# Patient Record
Sex: Female | Born: 1940 | Race: Black or African American | Hispanic: No | Marital: Married | Smoking: Former smoker
Health system: Southern US, Community
[De-identification: ages and names within clinical notes are randomized; demographics above are authoritative.]

## PROBLEM LIST (undated history)

## (undated) DIAGNOSIS — I10 Essential (primary) hypertension: Secondary | ICD-10-CM

## (undated) DIAGNOSIS — Z853 Personal history of malignant neoplasm of breast: Secondary | ICD-10-CM

## (undated) DIAGNOSIS — IMO0001 Reserved for inherently not codable concepts without codable children: Secondary | ICD-10-CM

## (undated) DIAGNOSIS — R7303 Prediabetes: Secondary | ICD-10-CM

## (undated) DIAGNOSIS — K219 Gastro-esophageal reflux disease without esophagitis: Secondary | ICD-10-CM

## (undated) DIAGNOSIS — D649 Anemia, unspecified: Secondary | ICD-10-CM

## (undated) DIAGNOSIS — M1990 Unspecified osteoarthritis, unspecified site: Secondary | ICD-10-CM

## (undated) DIAGNOSIS — E119 Type 2 diabetes mellitus without complications: Secondary | ICD-10-CM

## (undated) DIAGNOSIS — C801 Malignant (primary) neoplasm, unspecified: Secondary | ICD-10-CM

## (undated) DIAGNOSIS — Z803 Family history of malignant neoplasm of breast: Secondary | ICD-10-CM

## (undated) DIAGNOSIS — C50919 Malignant neoplasm of unspecified site of unspecified female breast: Secondary | ICD-10-CM

## (undated) DIAGNOSIS — Z8042 Family history of malignant neoplasm of prostate: Secondary | ICD-10-CM

## (undated) HISTORY — DX: Family history of malignant neoplasm of breast: Z80.3

## (undated) HISTORY — DX: Essential (primary) hypertension: I10

## (undated) HISTORY — PX: BREAST EXCISIONAL BIOPSY: SUR124

## (undated) HISTORY — PX: APPENDECTOMY: SHX54

## (undated) HISTORY — DX: Family history of malignant neoplasm of prostate: Z80.42

## (undated) HISTORY — PX: ABDOMINAL HYSTERECTOMY: SHX81

## (undated) HISTORY — PX: CATARACT EXTRACTION, BILATERAL: SHX1313

## (undated) HISTORY — PX: BREAST BIOPSY: SHX20

## (undated) HISTORY — DX: Type 2 diabetes mellitus without complications: E11.9

## (undated) HISTORY — DX: Malignant neoplasm of unspecified site of unspecified female breast: C50.919

## (undated) HISTORY — DX: Unspecified osteoarthritis, unspecified site: M19.90

## (undated) HISTORY — DX: Personal history of malignant neoplasm of breast: Z85.3

## (undated) HISTORY — DX: Malignant (primary) neoplasm, unspecified: C80.1

---

## 2004-09-14 ENCOUNTER — Ambulatory Visit: Payer: Self-pay | Admitting: Family Medicine

## 2005-07-12 DIAGNOSIS — C50919 Malignant neoplasm of unspecified site of unspecified female breast: Secondary | ICD-10-CM

## 2005-07-12 DIAGNOSIS — C801 Malignant (primary) neoplasm, unspecified: Secondary | ICD-10-CM

## 2005-07-12 HISTORY — DX: Malignant neoplasm of unspecified site of unspecified female breast: C50.919

## 2005-07-12 HISTORY — DX: Malignant (primary) neoplasm, unspecified: C80.1

## 2005-07-12 HISTORY — PX: BREAST LUMPECTOMY: SHX2

## 2005-11-09 ENCOUNTER — Ambulatory Visit: Payer: Self-pay | Admitting: Family Medicine

## 2005-11-10 ENCOUNTER — Ambulatory Visit: Payer: Self-pay | Admitting: Ophthalmology

## 2005-11-12 ENCOUNTER — Ambulatory Visit: Payer: Self-pay | Admitting: Family Medicine

## 2005-12-08 ENCOUNTER — Other Ambulatory Visit: Payer: Self-pay

## 2005-12-15 ENCOUNTER — Ambulatory Visit: Payer: Self-pay | Admitting: General Surgery

## 2005-12-24 ENCOUNTER — Ambulatory Visit: Payer: Self-pay | Admitting: Oncology

## 2006-01-10 ENCOUNTER — Inpatient Hospital Stay: Payer: Self-pay | Admitting: Oncology

## 2006-01-18 ENCOUNTER — Ambulatory Visit: Payer: Self-pay | Admitting: Oncology

## 2006-02-09 ENCOUNTER — Ambulatory Visit: Payer: Self-pay | Admitting: Oncology

## 2006-03-12 ENCOUNTER — Ambulatory Visit: Payer: Self-pay | Admitting: Oncology

## 2006-04-11 ENCOUNTER — Ambulatory Visit: Payer: Self-pay | Admitting: Oncology

## 2006-05-12 ENCOUNTER — Ambulatory Visit: Payer: Self-pay | Admitting: Oncology

## 2006-06-11 ENCOUNTER — Ambulatory Visit: Payer: Self-pay | Admitting: Oncology

## 2006-07-12 ENCOUNTER — Ambulatory Visit: Payer: Self-pay | Admitting: Oncology

## 2006-08-10 ENCOUNTER — Ambulatory Visit: Payer: Self-pay | Admitting: General Surgery

## 2006-08-12 ENCOUNTER — Ambulatory Visit: Payer: Self-pay | Admitting: Oncology

## 2006-09-19 ENCOUNTER — Ambulatory Visit: Payer: Self-pay | Admitting: Oncology

## 2006-10-11 ENCOUNTER — Ambulatory Visit: Payer: Self-pay | Admitting: Oncology

## 2006-12-11 ENCOUNTER — Ambulatory Visit: Payer: Self-pay | Admitting: Oncology

## 2006-12-19 ENCOUNTER — Ambulatory Visit: Payer: Self-pay | Admitting: Oncology

## 2007-01-10 ENCOUNTER — Ambulatory Visit: Payer: Self-pay | Admitting: Oncology

## 2007-02-10 ENCOUNTER — Ambulatory Visit: Payer: Self-pay | Admitting: Oncology

## 2007-04-12 ENCOUNTER — Ambulatory Visit: Payer: Self-pay | Admitting: Oncology

## 2007-04-24 ENCOUNTER — Ambulatory Visit: Payer: Self-pay | Admitting: Oncology

## 2007-05-13 ENCOUNTER — Ambulatory Visit: Payer: Self-pay | Admitting: Oncology

## 2007-07-25 ENCOUNTER — Ambulatory Visit: Payer: Self-pay | Admitting: Gastroenterology

## 2007-08-13 ENCOUNTER — Ambulatory Visit: Payer: Self-pay | Admitting: Radiation Oncology

## 2007-08-24 ENCOUNTER — Ambulatory Visit: Payer: Self-pay | Admitting: Radiation Oncology

## 2007-09-10 ENCOUNTER — Ambulatory Visit: Payer: Self-pay | Admitting: Radiation Oncology

## 2008-01-17 ENCOUNTER — Ambulatory Visit: Payer: Self-pay | Admitting: General Surgery

## 2008-02-10 ENCOUNTER — Ambulatory Visit: Payer: Self-pay | Admitting: Oncology

## 2008-05-02 ENCOUNTER — Ambulatory Visit: Payer: Self-pay | Admitting: Oncology

## 2008-05-12 ENCOUNTER — Ambulatory Visit: Payer: Self-pay | Admitting: Oncology

## 2008-06-11 ENCOUNTER — Ambulatory Visit: Payer: Self-pay | Admitting: Oncology

## 2009-01-20 ENCOUNTER — Ambulatory Visit: Payer: Self-pay | Admitting: General Surgery

## 2009-07-12 HISTORY — PX: JOINT REPLACEMENT: SHX530

## 2009-09-19 ENCOUNTER — Ambulatory Visit: Payer: Self-pay | Admitting: Orthopedic Surgery

## 2009-09-30 ENCOUNTER — Inpatient Hospital Stay: Payer: Self-pay | Admitting: Orthopedic Surgery

## 2010-01-21 ENCOUNTER — Ambulatory Visit: Payer: Self-pay | Admitting: General Surgery

## 2010-10-11 DEATH — deceased

## 2011-02-10 ENCOUNTER — Ambulatory Visit: Payer: Self-pay | Admitting: General Surgery

## 2011-04-07 ENCOUNTER — Ambulatory Visit: Payer: Self-pay | Admitting: Ophthalmology

## 2011-09-10 ENCOUNTER — Ambulatory Visit: Payer: Self-pay | Admitting: Physician Assistant

## 2011-12-11 HISTORY — PX: JOINT REPLACEMENT: SHX530

## 2011-12-14 ENCOUNTER — Ambulatory Visit: Payer: Self-pay | Admitting: Orthopedic Surgery

## 2011-12-14 LAB — BASIC METABOLIC PANEL
Anion Gap: 8 (ref 7–16)
Calcium, Total: 9.3 mg/dL (ref 8.5–10.1)
Chloride: 101 mmol/L (ref 98–107)
Co2: 32 mmol/L (ref 21–32)
Glucose: 98 mg/dL (ref 65–99)
Osmolality: 282 (ref 275–301)
Potassium: 3.4 mmol/L — ABNORMAL LOW (ref 3.5–5.1)
Sodium: 141 mmol/L (ref 136–145)

## 2011-12-14 LAB — SEDIMENTATION RATE: Erythrocyte Sed Rate: 15 mm/hr (ref 0–30)

## 2011-12-14 LAB — CBC
HGB: 12.9 g/dL (ref 12.0–16.0)
MCH: 28.1 pg (ref 26.0–34.0)
MCV: 85 fL (ref 80–100)
RBC: 4.6 10*6/uL (ref 3.80–5.20)
RDW: 14.8 % — ABNORMAL HIGH (ref 11.5–14.5)
WBC: 5.1 10*3/uL (ref 3.6–11.0)

## 2011-12-14 LAB — PROTIME-INR: Prothrombin Time: 12.6 secs (ref 11.5–14.7)

## 2011-12-14 LAB — MRSA PCR SCREENING

## 2011-12-28 ENCOUNTER — Inpatient Hospital Stay: Payer: Self-pay | Admitting: Orthopedic Surgery

## 2011-12-29 LAB — BASIC METABOLIC PANEL
Calcium, Total: 8.2 mg/dL — ABNORMAL LOW (ref 8.5–10.1)
Chloride: 102 mmol/L (ref 98–107)
Co2: 28 mmol/L (ref 21–32)
Creatinine: 0.57 mg/dL — ABNORMAL LOW (ref 0.60–1.30)
EGFR (African American): >60
Glucose: 132 mg/dL — ABNORMAL HIGH (ref 65–99)
Osmolality: 278 (ref 275–301)
Potassium: 2.9 mmol/L — ABNORMAL LOW (ref 3.5–5.1)
Sodium: 139 mmol/L (ref 136–145)

## 2011-12-29 LAB — PLATELET COUNT: Platelet: 163 10*3/uL (ref 150–440)

## 2011-12-30 LAB — PATHOLOGY REPORT

## 2011-12-31 LAB — BASIC METABOLIC PANEL
Glucose: 112 mg/dL — ABNORMAL HIGH (ref 65–99)
Osmolality: 279 (ref 275–301)
Potassium: 3.8 mmol/L (ref 3.5–5.1)
Sodium: 140 mmol/L (ref 136–145)

## 2012-02-14 ENCOUNTER — Ambulatory Visit: Payer: Self-pay | Admitting: General Surgery

## 2012-03-12 HISTORY — PX: KNEE SURGERY: SHX244

## 2012-03-15 ENCOUNTER — Ambulatory Visit: Payer: Self-pay | Admitting: Orthopedic Surgery

## 2012-03-15 LAB — BASIC METABOLIC PANEL
Anion Gap: 6 — ABNORMAL LOW (ref 7–16)
BUN: 10 mg/dL (ref 7–18)
Chloride: 102 mmol/L (ref 98–107)
Creatinine: 0.75 mg/dL (ref 0.60–1.30)
EGFR (African American): >60
Glucose: 113 mg/dL — ABNORMAL HIGH (ref 65–99)
Osmolality: 281 (ref 275–301)
Potassium: 2.9 mmol/L — ABNORMAL LOW (ref 3.5–5.1)

## 2012-03-21 ENCOUNTER — Ambulatory Visit: Payer: Self-pay | Admitting: Orthopedic Surgery

## 2012-03-21 LAB — POTASSIUM: Potassium: 3.2 mmol/L — ABNORMAL LOW (ref 3.5–5.1)

## 2013-01-03 ENCOUNTER — Ambulatory Visit: Payer: Self-pay | Admitting: Gastroenterology

## 2013-01-04 ENCOUNTER — Encounter: Payer: Self-pay | Admitting: General Surgery

## 2013-02-21 ENCOUNTER — Ambulatory Visit: Payer: Self-pay | Admitting: General Surgery

## 2013-02-21 ENCOUNTER — Encounter: Payer: Self-pay | Admitting: General Surgery

## 2013-02-22 ENCOUNTER — Ambulatory Visit: Payer: Self-pay | Admitting: General Surgery

## 2013-03-06 ENCOUNTER — Encounter: Payer: Self-pay | Admitting: General Surgery

## 2013-03-06 ENCOUNTER — Ambulatory Visit (INDEPENDENT_AMBULATORY_CARE_PROVIDER_SITE_OTHER): Payer: Medicare Other | Admitting: General Surgery

## 2013-03-06 VITALS — BP 132/78 | Ht 62.0 in | Wt 190.0 lb

## 2013-03-06 DIAGNOSIS — Z853 Personal history of malignant neoplasm of breast: Secondary | ICD-10-CM

## 2013-03-06 NOTE — Progress Notes (Signed)
Patient ID: Julie [name], female   DOB: [birth year], 72 y.o.   MRN: [id number]  Chief Complaint  Patient presents with  . Follow-up    mammogram    HPI Julie [name] is a 72 y.o. female  who presents for a breast evaluation. The most recent mammogram was done on 02/21/13 at [facility] .  Patient does perform regular self breast checks and gets regular mammograms done. The patient has a history of breast cancer of the right breast treated with a Lumpectomy w/SN, Radiation, and chemo in 2007. Patient reports no new breast symptoms.     HPI  Past Medical History  Diagnosis Date  . Hypertension   . Arthritis   . Personal history of malignant neoplasm of breast   . Cancer 2007    Right breast    Past Surgical History  Procedure Laterality Date  . Appendectomy    . Abdominal hysterectomy    . Breast biopsy Right 2007  . Breast lumpectomy Right 2007  . Knee surgery Right 03/2012  . Joint replacement Left 2011    knee  . Joint replacement Right 12/2011    knee    Family History  Problem Relation Age of Onset  . Breast cancer Mother     Social History History  Substance Use Topics  . Smoking status: Former Smoker    Types: Cigarettes  . Smokeless tobacco: Never Used  . Alcohol Use: No    No Known Allergies  Current Outpatient Prescriptions  Medication Sig Dispense Refill  . amLODipine (NORVASC) 10 MG tablet       . KLOR-CON M20 20 MEQ tablet       . meloxicam (MOBIC) 15 MG tablet       . metoprolol tartrate (LOPRESSOR) 25 MG tablet       . simvastatin (ZOCOR) 20 MG tablet       . triamterene-hydrochlorothiazide (MAXZIDE-25) 37.5-25 MG per tablet        No current facility-administered medications for this visit.    Review of Systems Review of Systems  Constitutional: Negative.   Respiratory: Negative.   Cardiovascular: Negative.     Blood pressure 132/78, height 5\' 2"  (1.575 m), weight 190 lb (86.183 kg).  Physical Exam Physical Exam  Constitutional: She is  oriented to person, place, and time. She appears well-developed and well-nourished.  Eyes: Conjunctivae are normal. No scleral icterus.  Neck: Neck supple. No mass and no thyromegaly present.  Cardiovascular: Normal rate, regular rhythm and normal heart sounds.   Pulses:      Dorsalis pedis pulses are 2+ on the right side, and 2+ on the left side.       Posterior tibial pulses are 2+ on the right side, and 2+ on the left side.  No edema  Pulmonary/Chest: Effort normal and breath sounds normal. Right breast exhibits no inverted nipple, no mass, no nipple discharge, no skin change and no tenderness. Left breast exhibits no inverted nipple, no mass, no nipple discharge, no skin change and no tenderness.  Abdominal: Soft. There is no hepatosplenomegaly. There is no tenderness. No hernia.  Lymphadenopathy:    She has no cervical adenopathy.    She has no axillary adenopathy.  Neurological: She is alert and oriented to person, place, and time.  Skin: Skin is warm and dry.    Data Reviewed Mammogram stable  Assessment    Stable exam. 7 years post treatment for right breast cancer    Plan  1 yr f/u with bilateral diag mammo        Julie [name] G 03/06/2013, 8:27 PM

## 2013-09-29 ENCOUNTER — Ambulatory Visit: Payer: Self-pay | Admitting: Otolaryngology

## 2014-02-21 ENCOUNTER — Encounter: Payer: Self-pay | Admitting: General Surgery

## 2014-03-07 ENCOUNTER — Encounter: Payer: Self-pay | Admitting: General Surgery

## 2014-03-07 ENCOUNTER — Ambulatory Visit: Payer: Self-pay | Admitting: Gastroenterology

## 2014-03-08 LAB — PATHOLOGY REPORT

## 2014-03-13 ENCOUNTER — Ambulatory Visit: Payer: Medicare Other | Admitting: General Surgery

## 2014-03-21 ENCOUNTER — Ambulatory Visit (INDEPENDENT_AMBULATORY_CARE_PROVIDER_SITE_OTHER): Payer: Medicare Other | Admitting: General Surgery

## 2014-03-21 ENCOUNTER — Encounter: Payer: Self-pay | Admitting: General Surgery

## 2014-03-21 VITALS — BP 114/76 | HR 76 | Resp 14 | Ht 63.0 in | Wt 188.0 lb

## 2014-03-21 DIAGNOSIS — Z853 Personal history of malignant neoplasm of breast: Secondary | ICD-10-CM

## 2014-03-21 NOTE — Progress Notes (Signed)
Patient ID: Julie [name], female   DOB: 1941/03/09, 73 y.o.   MRN: [id number]  Chief Complaint  Patient presents with  . Breast Cancer Long Term Follow Up    HPI Julie [name] is a 73 y.o. female.  who presents for her follow up breast cancer, mammogram and breast evaluation. The most recent mammogram was done on 02-21-14. Patient does perform regular self breast checks and gets regular mammograms done.  No new breast issues.   HPI  Past Medical History  Diagnosis Date  . Hypertension   . Arthritis   . Personal history of malignant neoplasm of breast   . Cancer 2007    Right breast    Past Surgical History  Procedure Laterality Date  . Appendectomy    . Abdominal hysterectomy    . Breast biopsy Right 2007  . Breast lumpectomy Right 2007  . Knee surgery Right 03/2012  . Joint replacement Left 2011    knee  . Joint replacement Right 12/2011    knee    Family History  Problem Relation Age of Onset  . Breast cancer Mother     Social History History  Substance Use Topics  . Smoking status: Former Smoker    Types: Cigarettes  . Smokeless tobacco: Never Used  . Alcohol Use: No    No Known Allergies  Current Outpatient Prescriptions  Medication Sig Dispense Refill  . amLODipine (NORVASC) 10 MG tablet Take 1 tablet by mouth daily.      Marland Kitchen aspirin EC 81 MG tablet Take 1 tablet by mouth daily.      . cyanocobalamin (,VITAMIN B-12,) 1000 MCG/ML injection Inject into the muscle every 30 (thirty) days.      Marland Kitchen KLOR-CON M20 20 MEQ tablet Take 20 mEq by mouth 2 (two) times daily.       . metoprolol tartrate (LOPRESSOR) 25 MG tablet Take 25 mg by mouth 2 (two) times daily.       Marland Kitchen omeprazole (PRILOSEC) 20 MG capsule Take 1 capsule by mouth as needed.      . simvastatin (ZOCOR) 20 MG tablet Take 20 mg by mouth daily at 6 PM.       . triamterene-hydrochlorothiazide (MAXZIDE-25) 37.5-25 MG per tablet Take 1 tablet by mouth daily.        No current facility-administered  medications for this visit.    Review of Systems Review of Systems  Constitutional: Negative.   Respiratory: Negative.   Cardiovascular: Negative.     Blood pressure 114/76, pulse 76, resp. rate 14, height 5\' 3"  (1.6 m), weight 188 lb (85.276 kg).  Physical Exam Physical Exam  Constitutional: She is oriented to person, place, and time. She appears well-developed and well-nourished.  Eyes: Conjunctivae are normal. No scleral icterus.  Neck: Neck supple. No thyromegaly present.  Cardiovascular: Normal rate, regular rhythm and normal heart sounds.   No murmur heard. Pulmonary/Chest: Effort normal and breath sounds normal. Right breast exhibits no inverted nipple, no mass, no nipple discharge, no skin change and no tenderness. Left breast exhibits no inverted nipple, no mass, no nipple discharge, no skin change and no tenderness.  Lymphadenopathy:    She has no cervical adenopathy.    She has no axillary adenopathy.  Neurological: She is alert and oriented to person, place, and time.  Skin: Skin is warm and dry.    Data Reviewed   Mammogram reviewed and stable.   Assessment    Stable exam. 19yrs post right breast lumpectomy/radiation  for CA.     Plan    Patient to return in 1 year with bilateral screening mammogram.        Junie Panning G 03/21/2014, 1:52 PM

## 2014-03-21 NOTE — Patient Instructions (Signed)
Patient to return in 1 year for follow up.Continue self breast exams. Call office for any new breast issues or concerns.  

## 2014-05-13 ENCOUNTER — Encounter: Payer: Self-pay | Admitting: General Surgery

## 2014-07-12 HISTORY — PX: MASTECTOMY: SHX3

## 2014-07-26 DIAGNOSIS — I495 Sick sinus syndrome: Secondary | ICD-10-CM | POA: Insufficient documentation

## 2014-10-29 NOTE — Op Note (Signed)
PATIENT NAME:  Julie [name], Julie [name] MR#:  [id number] DATE OF BIRTH:  12-04-1940  DATE OF PROCEDURE:  03/21/2012  PREOPERATIVE DIAGNOSIS: Arthrofibrosis, right total knee.   POSTOPERATIVE DIAGNOSIS: Arthrofibrosis, right total knee.   PROCEDURE: Manipulation right total knee.   ANESTHESIA: General.   SURGEON: Laurene Footman, MD   DESCRIPTION OF PROCEDURE: After general anesthesia been obtained, the time-out procedures carried out, and the knee examined, initially range of motion was 10 to 85 degrees with the patient asleep. With gentle manipulation bringing the leg up into flexion, there was audible popping of adhesions and flexion could be obtained to 120 degrees. With extension getting out to 5 degrees where preop there was a 10 degree flexion contracture. Further extension was also obtained and with just holding the hip in a flexed position, knee flexion measured 110 degrees without any force applied to the tibia. With passive flexion, flexion could be brought back to over 120. This was deemed a successful manipulation. The patient was sent to the recovery room in stable condition. There was no blood loss, no complications, and no specimen.   ____________________________ Laurene Footman, MD mjm:drc D: 03/21/2012 20:06:00 ET T: 03/22/2012 08:38:51 ET JOB#: [id number]  cc: Laurene Footman, MD, <Dictator> Laurene Footman MD ELECTRONICALLY SIGNED 03/22/2012 11:52

## 2014-11-03 NOTE — Op Note (Signed)
PATIENT NAME:  Julie [name], SMARR MR#:  [id number] DATE OF BIRTH:  [birth year]  DATE OF PROCEDURE:  12/28/2011  PREOPERATIVE DIAGNOSIS: Severe right knee osteoarthritis.   POSTOPERATIVE DIAGNOSIS: Severe right knee osteoarthritis.    PROCEDURE: Right total knee replacement.   SURGEON: Laurene Footman, MD  ASSISTANT: Dorthula Matas, PA-C.   ANESTHESIA: Spinal with femoral nerve block preop.   DESCRIPTION OF PROCEDURE: Patient brought to the Operating Room and after adequate spinal anesthesia was obtained the right leg was prepped and draped in the usual sterile fashion with a tourniquet applied to the upper thigh. After patient identification and timeout procedures were completed, the leg was exsanguinated with an Esmarch and the tourniquet raised. With the knee in flexion a midline skin incision was made followed by a medial parapatellar arthrotomy. Inspection of the knee revealed exposed bone in all compartments with eburnation medially. The anterior cruciate ligament was excised along with the infrapatellar fat pad. After exposure proximally of the tibia, the proximal tibia cutting guide for the Medacta guide was applied and the proximal tibia cut carried out. Following this, the femoral guide was placed and the distal femoral cut carried out with the appropriate rotation based on pins. Following this the cutting block for the distal femur was applied. Anterior and posterior chamfer cuts made. The tibial trial was placed with the appropriate rotation based on one of the initial pins and proximal drill hole into the tibia impactor for the notch cut placed. The 10 mm insert was placed along with a size 3 femur trial. The notch cut for the trochlea was made along with the distal femoral drill holes. The trial components were placed and full extension was obtained. Next, the patella was cut using the cutting guide with drill holes made and #1 gave appropriate coverage of the patella with excellent  tracking with the no touch technique through a range of motion. There was good stability through range of motion after converting to the PS insert with distal drill hole made through the femoral trial. At this point with trials the knee was very stable and the trials were removed. Bony surfaces thoroughly irrigated and dried. Injection of morphine, Toradol and Sensorcaine with epinephrine was carried out in the posterior capsule and medial arthrotomy. Tibial component was cemented into place first followed by the PS insert with a setscrew. Next, the femoral component was impacted into place and the knee held in extension followed by placement of the patellar button and clamping it and placing it holding the knee in extension until cement had set. After the cement had set, the excess cement was removed with the use of a small osteotome around the periphery. The knee was again thoroughly irrigated. The knee was noted to be stable through a range of motion with 0 to 120 degrees of range of motion obtained. The tourniquet was let down and hemostasis checked with electrocautery. The arthrotomy was repaired using a heavy quill suture for the capsule followed by 2-0 quill subcutaneously and skin staples. Xeroform, 4 x 4's, ABD, Webril, Polar Care, Ace wrap applied along with a knee immobilizer and patient sent to recovery room in stable condition.   SPECIMEN: Cut ends of bone.   ESTIMATED BLOOD LOSS: 50 mL.   TOURNIQUET TIME: 84 minutes at 300 mmHg.  IMPLANTS: Medacta GMK primary total knee system, a size right PS femoral component, a right 3 fixed tibial component, a size 3 12 mm PS tibial insert with a size 1 resurfacing patella.  COMPLICATIONS: None.   CONDITION: To recovery room stable.   ____________________________ Laurene Footman, MD mjm:cms D: 12/28/2011 23:09:09 ET T: 12/29/2011 10:15:55 ET JOB#: [id number]  cc: Laurene Footman, MD, <Dictator> Laurene Footman MD ELECTRONICALLY SIGNED 12/29/2011  13:28

## 2014-11-03 NOTE — Discharge Summary (Signed)
PATIENT NAME:  Julie [name], Julie [name] MR#:  [id number] DATE OF BIRTH:  26-Feb-1941  DATE OF ADMISSION:  12/28/2011 DATE OF DISCHARGE:  12/31/2011  ADMITTING DIAGNOSIS: Status post right total knee arthroplasty for degenerative arthritis.   DISCHARGE DIAGNOSES: Status post right total knee arthroplasty for degenerative arthritis, hypoxia, hypokalemia, improved.   ATTENDING: Hessie [name], M.D., [facility].   PROCEDURES: On 06/18 the patient underwent right total knee arthroplasty by Dr. Rudene [name] and assistant Julie Matas, PA-C.   ANESTHESIA: Spinal.   ESTIMATED BLOOD LOSS: 50 mL.  TOURNIQUET TIME: 84 minutes.   SPECIMENS: Cut ends of bone.   OPERATIVE FINDINGS: Sclerotic bone in tricompartmental regions with erosion of the medial tibial condyle.   IMPLANTS: Medacta, My Knee.   DRAINS: No drains were placed.   COMPLICATIONS: No complications had occurred.   HISTORY:   Julie [name] is a pleasant 74 year old who had previous successful left total knee replacement in 2011. She states she has waited too long for her right knee to be replaced. She has severe pain and swelling. Previous films of her knee even from several years ago had revealed medial joint space collapse and tricompartmental osteophytes. The patient wanted to proceed with knee replacement as soon as possible.   ALLERGIES AND ADVERSE REACTIONS: None.   PAST MEDICAL HISTORY:  1. Hypertension.  2. Hyperlipidemia.  3. Breast cancer.  4. Arthritis.  5. Allergies.   PHYSICAL EXAMINATION: HEART: Regular rate and rhythm. LUNGS: Clear to auscultation. RIGHT KNEE: Positive swelling. Range of motion is 5 to 105 degrees. She has some varus deformity. Distally the posterior tibial pulses are 2+ and she is able to move her foot well.   HOSPITAL COURSE: She underwent the aforementioned procedure on 75/50 without complication and was transferred to the PACU and the orthopedic floor in stable condition. She had quite a bit  of pain at first and IV Tylenol had been used, which seemed to help. She was treated with aspirin, Xarelto, and TED hose as well as AV-I boots for deep vein thrombosis prophylaxis. Celebrex was added to her pain regimen. She was tolerating her diet early on. Postop day two her Foley catheter would be removed. She did have some hypokalemia and had to be supplemented potassium. Hypokalemia would improve while she was here and potassium was 3.8 on 06/21. Her serum magnesium was within normal limits.  Her right knee dressing was changed postop day two. There was no sign of infection about her surgical site. She did have some dips in her oxygen saturation and was treated with nasal cannula oxygen while here. She tolerated her diet well. She did pass stool. She worked with physical therapy on multiple occasions while here and ambulated 350 feet her last day here and did work on stairs.    CONDITION AT DISCHARGE: Stable.   DISPOSITION: Home with Home Health physical therapy.   DISCHARGE MEDICATIONS:  1. Oxycodone 5 to 10 mg every four hours as needed for pain. She may also take Tylenol with this.  2. Celebrex 200 mg twice a day.  3. Xarelto 10 mg one every day per oral.  4. She will not go back on aspirin until completing Xarelto.   DISCHARGE INSTRUCTIONS AND FOLLOWUP:  1. She is weight-bearing as tolerated on the surgical leg.  2. She will wear TED hose during the day.  3. Regular diet.  4. Resume home medicines with multivitamin. 5. She will keep her knee dressing on, keep it clean and dry, and use  the Polar Care unit.  6. She will call our office for any troubling symptoms and for a two-week follow-up appointment.    ____________________________ Jerrel Ivory. Akera [place]berger, [place] jrp:bjt D: 01/01/2012 11:28:43 ET T: 01/01/2012 13:06:04 ET JOB#: [id number]  cc: Jerrel Ivory. Charlett Nose, [place], <Dictator> [place] PA ELECTRONICALLY SIGNED 01/05/2012 11:56

## 2015-01-08 ENCOUNTER — Other Ambulatory Visit: Payer: Self-pay

## 2015-01-08 DIAGNOSIS — Z853 Personal history of malignant neoplasm of breast: Secondary | ICD-10-CM

## 2015-03-06 ENCOUNTER — Ambulatory Visit (INDEPENDENT_AMBULATORY_CARE_PROVIDER_SITE_OTHER): Payer: Medicare Other | Admitting: General Surgery

## 2015-03-06 ENCOUNTER — Encounter: Payer: Self-pay | Admitting: General Surgery

## 2015-03-06 VITALS — BP 134/80 | HR 66 | Resp 14 | Ht 62.0 in | Wt 180.0 lb

## 2015-03-06 DIAGNOSIS — R928 Other abnormal and inconclusive findings on diagnostic imaging of breast: Secondary | ICD-10-CM | POA: Diagnosis not present

## 2015-03-06 DIAGNOSIS — Z853 Personal history of malignant neoplasm of breast: Secondary | ICD-10-CM | POA: Diagnosis not present

## 2015-03-06 NOTE — Patient Instructions (Addendum)
Follow up in 2 weeks to see if swelling resolves. No aspirin the day of follow up.

## 2015-03-06 NOTE — Progress Notes (Signed)
Patient ID: Julie [name], female   DOB: 03-23-1941, 74 y.o.   MRN: [id number]  Chief Complaint  Patient presents with  . Follow-up    HPI Julie [name] is a 74 y.o. female.  who presents for a breast evaluation. The most recent mammogram and right breast ultrasound was done on 02-28-15. Patient does perform regular self breast checks and gets regular mammograms done.  She states she could feel a lump in the right breast after the mammogram, and the right breast was real tender with the mammogram. The lump has been slowly resolving last few days.   HPI  Past Medical History  Diagnosis Date  . Hypertension   . Arthritis   . Personal history of malignant neoplasm of breast   . Cancer 2007    Right breast    Past Surgical History  Procedure Laterality Date  . Appendectomy    . Abdominal hysterectomy    . Breast biopsy Right 2007  . Breast lumpectomy Right 2007  . Knee surgery Right 03/2012  . Joint replacement Left 2011    knee  . Joint replacement Right 12/2011    knee    Family History  Problem Relation Age of Onset  . Breast cancer Mother     Social History Social History  Substance Use Topics  . Smoking status: Former Smoker    Types: Cigarettes  . Smokeless tobacco: Never Used  . Alcohol Use: No    No Known Allergies  Current Outpatient Prescriptions  Medication Sig Dispense Refill  . amLODipine (NORVASC) 10 MG tablet Take 1 tablet by mouth daily.    Marland Kitchen aspirin EC 81 MG tablet Take 1 tablet by mouth daily.    Marland Kitchen KLOR-CON M20 20 MEQ tablet Take 20 mEq by mouth 2 (two) times daily.     . metoprolol tartrate (LOPRESSOR) 25 MG tablet Take 25 mg by mouth 2 (two) times daily.     Marland Kitchen omeprazole (PRILOSEC) 20 MG capsule Take 1 capsule by mouth as needed.    . simvastatin (ZOCOR) 20 MG tablet Take 20 mg by mouth daily at 6 PM.     . triamterene-hydrochlorothiazide (MAXZIDE-25) 37.5-25 MG per tablet Take 1 tablet by mouth daily.      No current facility-administered  medications for this visit.    Review of Systems Review of Systems  Constitutional: Negative.   Respiratory: Negative.   Cardiovascular: Negative.     Blood pressure 134/80, pulse 66, resp. rate 14, height 5\' 2"  (1.575 m), weight 180 lb (81.647 kg).  Physical Exam Physical Exam  Constitutional: She is oriented to person, place, and time. She appears well-developed and well-nourished.  Eyes: Conjunctivae are normal. No scleral icterus.  Neck: Neck supple.  Cardiovascular: Normal rate, regular rhythm and normal heart sounds.   Pulmonary/Chest: Effort normal and breath sounds normal. Right breast exhibits no inverted nipple, no mass, no nipple discharge, no skin change and no tenderness. Left breast exhibits no inverted nipple, no mass, no nipple discharge, no skin change and no tenderness.  Right breast fullness in the right upper outer quadrant just above the lumpectomy incision.   Abdominal: Soft. Normal appearance. There is no tenderness.  Lymphadenopathy:    She has no cervical adenopathy.    She has no axillary adenopathy.  Neurological: She is alert and oriented to person, place, and time.  Skin: Skin is warm and dry.  Psychiatric: She has a normal mood and affect.    Data Reviewed Mammogram and  ultrasound- new density adjacent to lumpectomy site.   Assessment    History of right breast cancer. Abnormal mammogram. Also appears she has a contusion in right lumpectomy site.      Plan    Recheck in 2 weeks. If the fullness has resolved will proceed with core biopsy      PCP:  Buzzy Han 03/06/2015, 11:11 AM

## 2015-03-25 ENCOUNTER — Other Ambulatory Visit: Payer: Medicare Other

## 2015-03-25 ENCOUNTER — Encounter: Payer: Self-pay | Admitting: General Surgery

## 2015-03-25 ENCOUNTER — Ambulatory Visit (INDEPENDENT_AMBULATORY_CARE_PROVIDER_SITE_OTHER): Payer: Medicare Other | Admitting: General Surgery

## 2015-03-25 VITALS — BP 138/64 | HR 82 | Resp 16 | Ht 62.0 in | Wt 181.8 lb

## 2015-03-25 DIAGNOSIS — N63 Unspecified lump in breast: Secondary | ICD-10-CM | POA: Diagnosis not present

## 2015-03-25 DIAGNOSIS — N6310 Unspecified lump in the right breast, unspecified quadrant: Secondary | ICD-10-CM

## 2015-03-25 DIAGNOSIS — Z853 Personal history of malignant neoplasm of breast: Secondary | ICD-10-CM | POA: Diagnosis not present

## 2015-03-25 NOTE — Progress Notes (Signed)
74 year old female here for a right breast follow up and ultrasound. The right breast above the lumpectomy site shows decreased fullness compared to 2 weeks ago.  [place] of this area shows some illdefined area of heterogeneity above the lumpectomy site. This may represent resolving ecchymosis from the recent mammogram. Will allow another 4 weeks for this to resolve fully and reassess  Follow up in one month with repeat office ultrasound and possible core biopsy.   PCP:  [place] Pink

## 2015-03-25 NOTE — Patient Instructions (Signed)
The patient is aware to call back for any questions or concerns.  

## 2015-03-26 ENCOUNTER — Encounter: Payer: Self-pay | Admitting: General Surgery

## 2015-04-24 ENCOUNTER — Other Ambulatory Visit: Payer: Medicare Other

## 2015-04-24 ENCOUNTER — Ambulatory Visit (INDEPENDENT_AMBULATORY_CARE_PROVIDER_SITE_OTHER): Payer: Medicare Other | Admitting: General Surgery

## 2015-04-24 ENCOUNTER — Encounter: Payer: Self-pay | Admitting: General Surgery

## 2015-04-24 VITALS — BP 140/68 | HR 74 | Resp 14 | Ht 62.0 in | Wt 181.0 lb

## 2015-04-24 DIAGNOSIS — N63 Unspecified lump in breast: Secondary | ICD-10-CM | POA: Diagnosis not present

## 2015-04-24 DIAGNOSIS — N6310 Unspecified lump in the right breast, unspecified quadrant: Secondary | ICD-10-CM

## 2015-04-24 NOTE — Patient Instructions (Signed)

## 2015-04-24 NOTE — Progress Notes (Signed)
Patient ID: Julie [name], female   DOB: October 22, 1940, 74 y.o.   MRN: [id number]  Chief Complaint  Patient presents with  . Follow-up    right breast ultrasound    HPI Julie [name] is a 74 y.o. female here today for a right breast ultrasound. She states most of the swelling is gone now.   HPI  Past Medical History  Diagnosis Date  . Hypertension   . Arthritis   . Personal history of malignant neoplasm of breast   . Cancer [facility]) 2007    Right breast    Past Surgical History  Procedure Laterality Date  . Appendectomy    . Abdominal hysterectomy    . Breast biopsy Right 2007  . Breast lumpectomy Right 2007  . Knee surgery Right 03/2012  . Joint replacement Left 2011    knee  . Joint replacement Right 12/2011    knee    Family History  Problem Relation Age of Onset  . Breast cancer Mother     Social History Social History  Substance Use Topics  . Smoking status: Former Smoker    Types: Cigarettes  . Smokeless tobacco: Never Used  . Alcohol Use: No    No Known Allergies  Current Outpatient Prescriptions  Medication Sig Dispense Refill  . amLODipine (NORVASC) 10 MG tablet Take 1 tablet by mouth daily.    Marland Kitchen aspirin EC 81 MG tablet Take 1 tablet by mouth daily.    Marland Kitchen KLOR-CON M20 20 MEQ tablet Take 20 mEq by mouth 2 (two) times daily.     . metoprolol tartrate (LOPRESSOR) 25 MG tablet Take 25 mg by mouth 2 (two) times daily.     Marland Kitchen omeprazole (PRILOSEC) 20 MG capsule Take 1 capsule by mouth as needed.    . simvastatin (ZOCOR) 20 MG tablet Take 20 mg by mouth daily at 6 PM.     . triamterene-hydrochlorothiazide (MAXZIDE-25) 37.5-25 MG per tablet Take 1 tablet by mouth daily.      No current facility-administered medications for this visit.    Review of Systems Review of Systems  Constitutional: Negative.   Respiratory: Negative.   Cardiovascular: Negative.     Blood pressure 140/68, pulse 74, resp. rate 14, height 5\' 2"  (1.575 m), weight 181 lb (82.101  kg).  Physical Exam Physical Exam  Constitutional: She is oriented to person, place, and time. She appears well-developed and well-nourished.  Neurological: She is alert and oriented to person, place, and time.  Skin: Skin is warm.  Psychiatric: Her behavior is normal.  Right breast exam shows again some firm tissue above lumpectomy site in uoq.  Data Reviewed Previous mammogram and ultrasound. [place] today showed again some irregular ill defined mass like area above the lumpectomy site Assessment    Persistent vague finding in right breast superior to the prior lumpectomy site. Core biopsy recommended.    Plan    With consent core biopsy completed today. Await path report.       PCP:  Buzzy Han 04/24/2015, 6:15 PM

## 2015-04-25 ENCOUNTER — Other Ambulatory Visit: Payer: Self-pay

## 2015-04-25 DIAGNOSIS — C50911 Malignant neoplasm of unspecified site of right female breast: Secondary | ICD-10-CM

## 2015-04-26 LAB — CBC WITH DIFFERENTIAL/PLATELET
BASOS: 0 %
Basophils Absolute: 0 10*3/uL (ref 0.0–0.2)
EOS (ABSOLUTE): 0.2 10*3/uL (ref 0.0–0.4)
EOS: 4 %
HEMATOCRIT: 39.6 % (ref 34.0–46.6)
Hemoglobin: 13.2 g/dL (ref 11.1–15.9)
IMMATURE GRANS (ABS): 0 10*3/uL (ref 0.0–0.1)
IMMATURE GRANULOCYTES: 0 %
LYMPHS: 27 %
Lymphocytes Absolute: 1.2 10*3/uL (ref 0.7–3.1)
MCH: 27.2 pg (ref 26.6–33.0)
MCHC: 33.3 g/dL (ref 31.5–35.7)
MCV: 82 fL (ref 79–97)
MONOCYTES: 11 %
Monocytes Absolute: 0.5 10*3/uL (ref 0.1–0.9)
NEUTROS PCT: 58 %
Neutrophils Absolute: 2.6 10*3/uL (ref 1.4–7.0)
PLATELETS: 212 10*3/uL (ref 150–379)
RBC: 4.85 x10E6/uL (ref 3.77–5.28)
RDW: 15.6 % — ABNORMAL HIGH (ref 12.3–15.4)
WBC: 4.5 10*3/uL (ref 3.4–10.8)

## 2015-04-26 LAB — COMPREHENSIVE METABOLIC PANEL
ALT: 13 IU/L (ref 0–32)
AST: 16 IU/L (ref 0–40)
Albumin/Globulin Ratio: 2 (ref 1.1–2.5)
Albumin: 4.7 g/dL (ref 3.5–4.8)
Alkaline Phosphatase: 108 IU/L (ref 39–117)
BUN/Creatinine Ratio: 19 (ref 11–26)
BUN: 15 mg/dL (ref 8–27)
Bilirubin Total: 0.3 mg/dL (ref 0.0–1.2)
CALCIUM: 9.8 mg/dL (ref 8.7–10.3)
CO2: 25 mmol/L (ref 18–29)
CREATININE: 0.8 mg/dL (ref 0.57–1.00)
Chloride: 96 mmol/L — ABNORMAL LOW (ref 97–108)
GFR calc Af Amer: 84 mL/min/{1.73_m2} (ref >59)
GFR, EST NON AFRICAN AMERICAN: 73 mL/min/{1.73_m2} (ref >59)
GLOBULIN, TOTAL: 2.4 g/dL (ref 1.5–4.5)
Glucose: 120 mg/dL — ABNORMAL HIGH (ref 65–99)
Potassium: 3.7 mmol/L (ref 3.5–5.2)
Sodium: 138 mmol/L (ref 134–144)
Total Protein: 7.1 g/dL (ref 6.0–8.5)

## 2015-04-26 LAB — CANCER ANTIGEN 27.29: CA 27.29: 13.4 U/mL (ref 0.0–38.6)

## 2015-04-28 ENCOUNTER — Ambulatory Visit (INDEPENDENT_AMBULATORY_CARE_PROVIDER_SITE_OTHER): Payer: Medicare Other | Admitting: General Surgery

## 2015-04-28 ENCOUNTER — Encounter: Payer: Self-pay | Admitting: General Surgery

## 2015-04-28 VITALS — BP 126/63 | HR 60 | Resp 12 | Ht 62.0 in | Wt 181.0 lb

## 2015-04-28 DIAGNOSIS — C50911 Malignant neoplasm of unspecified site of right female breast: Secondary | ICD-10-CM

## 2015-04-28 NOTE — Progress Notes (Signed)
Patient ID: Julie [name], female   DOB: 02-01-1941, 74 y.o.   MRN: [id number] Patient is here for breast cancer discussion.  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 Pt was advised fully. ER/PR and Her 2 are pending but her last cancer was triple negative. CA 27-29 is normal as also her basic labs.Assumimg she has no metastatic disease  A mastectomy is best option. This patient will see Dr. Oliva Bustard at the [facility] in the near future. Further follow up will be based his input.

## 2015-04-28 NOTE — Patient Instructions (Signed)
Pt will see Dr. Oliva Bustard for his input and then will discuss treatment plan.

## 2015-04-29 ENCOUNTER — Telehealth: Payer: Self-pay

## 2015-04-29 NOTE — Telephone Encounter (Signed)
Spoke with patient about appointment to see Dr Oliva Bustard. Patient is scheduled to see Dr Oliva Bustard at the [facility] cancer center on 04/30/15 at 1:00 pm. Patient is aware of date and time.

## 2015-04-30 ENCOUNTER — Ambulatory Visit: Payer: Medicare Other

## 2015-04-30 ENCOUNTER — Encounter: Payer: Self-pay | Admitting: Oncology

## 2015-04-30 ENCOUNTER — Encounter (INDEPENDENT_AMBULATORY_CARE_PROVIDER_SITE_OTHER): Payer: Self-pay

## 2015-04-30 ENCOUNTER — Inpatient Hospital Stay: Payer: Medicare Other | Attending: Oncology | Admitting: Oncology

## 2015-04-30 VITALS — BP 139/67 | HR 55 | Temp 96.6°F | Ht 62.0 in | Wt 179.9 lb

## 2015-04-30 DIAGNOSIS — Z9221 Personal history of antineoplastic chemotherapy: Secondary | ICD-10-CM | POA: Diagnosis not present

## 2015-04-30 DIAGNOSIS — I10 Essential (primary) hypertension: Secondary | ICD-10-CM | POA: Diagnosis not present

## 2015-04-30 DIAGNOSIS — C50919 Malignant neoplasm of unspecified site of unspecified female breast: Secondary | ICD-10-CM

## 2015-04-30 DIAGNOSIS — Z171 Estrogen receptor negative status [ER-]: Secondary | ICD-10-CM | POA: Diagnosis not present

## 2015-04-30 DIAGNOSIS — E538 Deficiency of other specified B group vitamins: Secondary | ICD-10-CM | POA: Insufficient documentation

## 2015-04-30 DIAGNOSIS — K635 Polyp of colon: Secondary | ICD-10-CM | POA: Insufficient documentation

## 2015-04-30 DIAGNOSIS — C50311 Malignant neoplasm of lower-inner quadrant of right female breast: Secondary | ICD-10-CM

## 2015-04-30 DIAGNOSIS — Z79899 Other long term (current) drug therapy: Secondary | ICD-10-CM

## 2015-04-30 DIAGNOSIS — Z853 Personal history of malignant neoplasm of breast: Secondary | ICD-10-CM

## 2015-04-30 DIAGNOSIS — M129 Arthropathy, unspecified: Secondary | ICD-10-CM | POA: Diagnosis not present

## 2015-04-30 DIAGNOSIS — Z803 Family history of malignant neoplasm of breast: Secondary | ICD-10-CM | POA: Diagnosis not present

## 2015-04-30 DIAGNOSIS — R197 Diarrhea, unspecified: Secondary | ICD-10-CM | POA: Insufficient documentation

## 2015-04-30 DIAGNOSIS — E785 Hyperlipidemia, unspecified: Secondary | ICD-10-CM | POA: Insufficient documentation

## 2015-04-30 DIAGNOSIS — N644 Mastodynia: Secondary | ICD-10-CM | POA: Insufficient documentation

## 2015-04-30 DIAGNOSIS — Z7982 Long term (current) use of aspirin: Secondary | ICD-10-CM | POA: Insufficient documentation

## 2015-04-30 DIAGNOSIS — D126 Benign neoplasm of colon, unspecified: Secondary | ICD-10-CM | POA: Insufficient documentation

## 2015-04-30 DIAGNOSIS — C50911 Malignant neoplasm of unspecified site of right female breast: Secondary | ICD-10-CM

## 2015-04-30 DIAGNOSIS — D649 Anemia, unspecified: Secondary | ICD-10-CM | POA: Insufficient documentation

## 2015-04-30 DIAGNOSIS — Z923 Personal history of irradiation: Secondary | ICD-10-CM | POA: Diagnosis not present

## 2015-04-30 DIAGNOSIS — Z87891 Personal history of nicotine dependence: Secondary | ICD-10-CM

## 2015-04-30 DIAGNOSIS — M1990 Unspecified osteoarthritis, unspecified site: Secondary | ICD-10-CM | POA: Insufficient documentation

## 2015-04-30 HISTORY — DX: Malignant neoplasm of unspecified site of unspecified female breast: C50.919

## 2015-04-30 LAB — CBC WITH DIFFERENTIAL/PLATELET
BASOS ABS: 0 10*3/uL (ref 0–0.1)
BASOS PCT: 1 %
Eosinophils Absolute: 0.2 10*3/uL (ref 0–0.7)
Eosinophils Relative: 3 %
HEMATOCRIT: 41.3 % (ref 35.0–47.0)
HEMOGLOBIN: 13.6 g/dL (ref 12.0–16.0)
Lymphocytes Relative: 17 %
Lymphs Abs: 1.1 10*3/uL (ref 1.0–3.6)
MCH: 26.9 pg (ref 26.0–34.0)
MCHC: 32.9 g/dL (ref 32.0–36.0)
MCV: 81.6 fL (ref 80.0–100.0)
MONO ABS: 0.5 10*3/uL (ref 0.2–0.9)
Monocytes Relative: 7 %
NEUTROS ABS: 4.7 10*3/uL (ref 1.4–6.5)
NEUTROS PCT: 72 %
Platelets: 204 10*3/uL (ref 150–440)
RBC: 5.05 MIL/uL (ref 3.80–5.20)
RDW: 15.4 % — AB (ref 11.5–14.5)
WBC: 6.5 10*3/uL (ref 3.6–11.0)

## 2015-04-30 NOTE — Progress Notes (Signed)
[place] @ [facility] Telephone:(336) [phone]  Fax:(336) [place]: 09-Feb-1941  MR#: [id number]  WER#:[id number]  Patient Care Team: Maryland Pink, MD as PCP - General (Family Medicine) Seeplaputhur Robinette Haines, MD (General Surgery)  CHIEF COMPLAINT:  Chief Complaint  Patient presents with  . Breast Cancer    pain in right breast after biopsy   Diagnosis - Chief Complaint             carcinoma of the breast, status post                               lumpectomy and sentinel lymph node biopsy.                               June 2007                               ER/PR negative                               Her-2 negative                                                              AJCC Staging pT2N1(mic)M0                               Stage grouping: IIb                                                              Completed 6 cycles Cytoxan/Taxotere                               October 2007.                               Right breast XRT.   2.  October 2 016 Patient had abnormal mammogram followed by ultrasound-guided biopsy.  It was positive for triple negative disease. Because of hematoma exact size is not known VISIT DIAGNOSIS:     ICD-9-CM ICD-10-CM   1. 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 Patient had previous lumpectomy radiation therapy and chemotherapy in 2008 Patient was getting regular mammogram done Does not have any bony pain.  Recently mammogram was abnormal.  Patient was evaluated by Dr. Jamal Collin there was significant ecchymosis present.  Which gradually resolved.  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  Appetite has been stable.  Since last evaluation patient had bilateral knee surgery done   REVIEW OF SYSTEMS:   GENERAL:  Feels good.  Active.  No fevers, sweats or weight loss.  Patient is slightly apprehensive PERFORMANCE STATUS (ECOG): 0 HEENT:  No visual changes, runny nose, sore throat, mouth sores or tenderness. Lungs: No shortness of breath or cough.  No hemoptysis. Cardiac:  No chest pain, palpitations, orthopnea, or PND. GI:  No nausea, vomiting, diarrhea, constipation, melena or hematochezia. GU:  No urgency, frequency, dysuria, or hematuria. Musculoskeletal:  : Patient had bilateral knee surgery Extremities:  No pain or swelling. Skin:  No rashes or skin changes. Neuro:  No headache, numbness or weakness, balance or coordination issues. Endocrine:  No diabetes, thyroid issues, hot flashes or night sweats. Psych:  No mood changes, depression or anxiety. Pain:  No focal pain. Review of systems:  All other systems reviewed and found to be negative.   As per HPI. Otherwise, a complete review of systems is negatve.  PAST MEDICAL HISTORY: Past Medical History  Diagnosis Date  . Hypertension   . Arthritis   . Personal history of malignant neoplasm of breast   . Cancer ([place]) 2007    Right breast  . Breast cancer ([place])     PAST SURGICAL HISTORY: Past Surgical History  Procedure Laterality Date  . Appendectomy    . Abdominal hysterectomy    . Breast biopsy Right 2007  . Breast lumpectomy Right 2007  . Knee surgery Right 03/2012  . Joint replacement Left 2011    knee  . Joint replacement Right 12/2011    knee    FAMILY HISTORY Family History  Problem Relation Age of Onset  . Breast cancer Mother     GYNECOLOGIC HISTORY:  No LMP recorded. Patient has had a hysterectomy.     ADVANCED DIRECTIVES:  Patient does  have advance healthcare directive, Patient   does not desire to make any changes  HEALTH MAINTENANCE: Social History  Substance Use Topics  . Smoking status: Former Smoker    Types: Cigarettes  . Smokeless tobacco: Never Used  . Alcohol Use: No      No Known Allergies  Current Outpatient Prescriptions  Medication Sig Dispense Refill  . amLODipine (NORVASC) 10 MG tablet Take 1 tablet by mouth daily.    Marland Kitchen aspirin EC 81 MG tablet Take 1 tablet by mouth daily.    Marland Kitchen KLOR-CON M20 20 MEQ tablet Take 20 mEq by mouth 2 (two) times daily.     . metoprolol tartrate (LOPRESSOR) 25 MG tablet Take 25 mg by mouth 2 (two) times daily.     Marland Kitchen omeprazole (PRILOSEC) 20 MG capsule Take 1 capsule by mouth as needed.    . simvastatin (ZOCOR) 20 MG tablet Take 20 mg by mouth daily at 6 PM.     . triamterene-hydrochlorothiazide (MAXZIDE-25) 37.5-25 MG per tablet Take 1 tablet by mouth daily.      No current facility-administered medications for this visit.    OBJECTIVE: PHYSICAL EXAM:  GENERAL:  Well developed, well nourished, sitting comfortably in the exam room in no acute distress. MENTAL STATUS:  Alert and oriented to person, place and time.  ENT:  Oropharynx clear without lesion.  Tongue normal. Mucous membranes moist.  RESPIRATORY:  Clear to auscultation without rales, wheezes or rhonchi. CARDIOVASCULAR:  Regular rate and rhythm without murmur, rub or gallop. BREAST: Right breast: Palpable area approximately 2 cm possibility of ecchymosis from previous mammogram and ultrasound guided biopsy.   Axillary nodes not palpable.   Left breast free of masses  Lumpectomy scar within normal limit ABDOMEN:  Soft, non-tender, with active bowel sounds, and no hepatosplenomegaly.  No masses. BACK:  No CVA tenderness.  No tenderness on percussion of the back or rib cage. SKIN:  No rashes, ulcers or lesions. EXTREMITIES: No edema, no skin discoloration or tenderness.  No palpable cords. LYMPH NODES: No  palpable cervical, supraclavicular, axillary or inguinal adenopathy  NEUROLOGICAL: Unremarkable. PSYCH:  Slightly apprehensive Filed Vitals:   04/30/15 1327  BP: 139/67  Pulse: 55  Temp: 96.6 F (35.9 C)     Body mass index is 32.89 kg/(m^2).    ECOG FS:0 - Asymptomatic  LAB RESULTS: Pathology report has been reviewed independently  All lab data has been reviewed [place] - Family History              mother had breast cancer at age 13.  Died                               at 24 - Social History              negative alcohol, negative tobacco - Additional Past Medical and hypertension   Surgical History            Hyperlipidemia                               Hysterectomy                               Hernia repair  ASSESSMENT:  Patient had a carcinoma of right breast was T2 N1 micrograms M0 tumor\ tumor was triple negative status post chemotherapy with Cytoxan and Taxotere followed by radiation therapy 2.  Recent abnormal mammogram on the right breast biopsy was consistent with invasive mammary carcinoma, apocrine features, triple negative disease Possibility of local recurrence versus second primary tumor Case will be discussed in tumor conference Complete restaging to rule out any metastases If there is no evidence of recurrent or progressive disease outside the breast and/or lymph node in the right axilla then possibility of mastectomy would be recommended and patient can be followed depending on the size of the tumor and the lymph node assessment  2.  The patient has a metastases disease then possibility of further chemotherapy can be considered I had prolonged discussion with patient and she understood.  PET scan has been ordered Case will be discussed in breast cancer conference  I discussed case with Dr. Jamal Collin , referring surgeon and that he is agreement with our plan   Patient expressed understanding and was in agreement with this plan. She also understands that She  can call clinic at any time with any questions, concerns, or complaints.    No matching staging information was found for the patient.  Forest Gleason, MD   04/30/2015 2:01 PM

## 2015-04-30 NOTE — Progress Notes (Signed)
Patient is here as follow up to breast biopsy on 04/24/15.  Pain reported in right breast, does not radiate to underarm area

## 2015-05-06 ENCOUNTER — Ambulatory Visit
Admission: RE | Admit: 2015-05-06 | Discharge: 2015-05-06 | Disposition: A | Discharge status: Home / Self Care | Payer: Medicare Other | Source: Ambulatory Visit | Attending: Oncology | Admitting: Oncology

## 2015-05-06 DIAGNOSIS — M899 Disorder of bone, unspecified: Secondary | ICD-10-CM | POA: Insufficient documentation

## 2015-05-06 DIAGNOSIS — N63 Unspecified lump in breast: Secondary | ICD-10-CM | POA: Diagnosis not present

## 2015-05-06 DIAGNOSIS — Z08 Encounter for follow-up examination after completed treatment for malignant neoplasm: Secondary | ICD-10-CM | POA: Insufficient documentation

## 2015-05-06 DIAGNOSIS — Z853 Personal history of malignant neoplasm of breast: Secondary | ICD-10-CM

## 2015-05-06 LAB — GLUCOSE, CAPILLARY: Glucose-Capillary: 110 mg/dL — ABNORMAL HIGH (ref 65–99)

## 2015-05-06 MED ORDER — FLUDEOXYGLUCOSE F - 18 (FDG) INJECTION
12.9400 | Freq: Once | INTRAVENOUS | Status: DC | PRN
Start: 2015-05-06 — End: 2015-05-12
  Administered 2015-05-06: 12.94 via INTRAVENOUS
  Filled 2015-05-06: qty 12.94

## 2015-05-08 ENCOUNTER — Encounter: Payer: Self-pay | Admitting: Oncology

## 2015-05-08 ENCOUNTER — Inpatient Hospital Stay (HOSPITAL_BASED_OUTPATIENT_CLINIC_OR_DEPARTMENT_OTHER): Payer: Medicare Other | Admitting: Oncology

## 2015-05-08 VITALS — BP 151/70 | HR 63 | Temp 96.3°F | Wt 181.7 lb

## 2015-05-08 DIAGNOSIS — Z171 Estrogen receptor negative status [ER-]: Secondary | ICD-10-CM | POA: Diagnosis not present

## 2015-05-08 DIAGNOSIS — N644 Mastodynia: Secondary | ICD-10-CM

## 2015-05-08 DIAGNOSIS — Z803 Family history of malignant neoplasm of breast: Secondary | ICD-10-CM

## 2015-05-08 DIAGNOSIS — Z7982 Long term (current) use of aspirin: Secondary | ICD-10-CM

## 2015-05-08 DIAGNOSIS — I10 Essential (primary) hypertension: Secondary | ICD-10-CM

## 2015-05-08 DIAGNOSIS — M129 Arthropathy, unspecified: Secondary | ICD-10-CM

## 2015-05-08 DIAGNOSIS — Z79899 Other long term (current) drug therapy: Secondary | ICD-10-CM | POA: Diagnosis not present

## 2015-05-08 DIAGNOSIS — C50311 Malignant neoplasm of lower-inner quadrant of right female breast: Secondary | ICD-10-CM

## 2015-05-08 DIAGNOSIS — R197 Diarrhea, unspecified: Secondary | ICD-10-CM

## 2015-05-08 DIAGNOSIS — Z923 Personal history of irradiation: Secondary | ICD-10-CM

## 2015-05-08 DIAGNOSIS — Z9221 Personal history of antineoplastic chemotherapy: Secondary | ICD-10-CM

## 2015-05-08 DIAGNOSIS — C50911 Malignant neoplasm of unspecified site of right female breast: Secondary | ICD-10-CM | POA: Diagnosis not present

## 2015-05-08 DIAGNOSIS — Z87891 Personal history of nicotine dependence: Secondary | ICD-10-CM

## 2015-05-08 NOTE — Progress Notes (Signed)
[place] @ [facility] Telephone:(336) [phone]  Fax:(336) [place]: 07/21/1940  MR#: [id number]  YTW#:[id number]  Patient Care Team: Maryland Pink, MD as PCP - General (Family Medicine) Seeplaputhur Robinette Haines, MD (General Surgery)  CHIEF COMPLAINT:  Chief Complaint  Patient presents with  . Results   Diagnosis - Chief Complaint             carcinoma of the breast, status post                               lumpectomy and sentinel lymph node biopsy.                               June 2007                               ER/PR negative                               Her-2 negative                                                              AJCC Staging pT2N1(mic)M0                               Stage grouping: IIb                                                              Completed 6 cycles Cytoxan/Taxotere                               October 2007.                               Right breast XRT.   2.  October 2 016 Patient had abnormal mammogram followed by ultrasound-guided biopsy.  It was positive for triple negative disease. Because of hematoma exact size is not known.  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 Patient had previous lumpectomy radiation therapy and chemotherapy in 2008 Patient was getting regular mammogram done Does not have any bony pain.  Recently mammogram was abnormal.  Patient was evaluated by Dr. Jamal Collin there was significant ecchymosis present.  Which gradually resolved.  Ultrasound-guided biopsy was positive for invasive mammary carcinoma triple negative disease with same apocrine features Patient was referred to me for further  evaluation and treatment consideration No significant bony  pains.  Appetite has been stable.  Since last evaluation patient had bilateral knee surgery done   REVIEW OF SYSTEMS:   GENERAL:  Feels good.  Active.  No fevers, sweats or weight loss.  Patient is slightly apprehensive PERFORMANCE STATUS (ECOG): 0 HEENT:  No visual changes, runny nose, sore throat, mouth sores or tenderness. Lungs: No shortness of breath or cough.  No hemoptysis. Cardiac:  No chest pain, palpitations, orthopnea, or PND. GI:  No nausea, vomiting, diarrhea, constipation, melena or hematochezia. GU:  No urgency, frequency, dysuria, or hematuria. Musculoskeletal:  : Patient had bilateral knee surgery Extremities:  No pain or swelling. Skin:  No rashes or skin changes. Neuro:  No headache, numbness or weakness, balance or coordination issues. Endocrine:  No diabetes, thyroid issues, hot flashes or night sweats. Psych:  No mood changes, depression or anxiety. Pain:  No focal pain. Review of systems:  All other systems reviewed and found to be negative.   As per HPI. Otherwise, a complete review of systems is negatve.  PAST MEDICAL HISTORY: Past Medical History  Diagnosis Date  . Hypertension   . Arthritis   . Personal history of malignant neoplasm of breast   . Cancer ([place]) 2007    Right breast  . Breast cancer ([place])   . Cancer of breast ([place]) 04/30/2015    PAST SURGICAL HISTORY: Past Surgical History  Procedure Laterality Date  . Appendectomy    . Abdominal hysterectomy    . Breast biopsy Right 2007  . Breast lumpectomy Right 2007  . Knee surgery Right 03/2012  . Joint replacement Left 2011    knee  . Joint replacement Right 12/2011    knee    FAMILY HISTORY Family History  Problem Relation Age of Onset  . Breast cancer Mother     GYNECOLOGIC HISTORY:  No LMP recorded. Patient has had a hysterectomy.     ADVANCED DIRECTIVES:  Patient does have advance healthcare directive, Patient   does  not desire to make any changes  HEALTH MAINTENANCE: Social History  Substance Use Topics  . Smoking status: Former Smoker    Types: Cigarettes  . Smokeless tobacco: Never Used  . Alcohol Use: No      No Known Allergies  Current Outpatient Prescriptions  Medication Sig Dispense Refill  . amLODipine (NORVASC) 10 MG tablet Take 1 tablet by mouth daily.    Marland Kitchen aspirin EC 81 MG tablet Take 1 tablet by mouth daily.    Marland Kitchen KLOR-CON M20 20 MEQ tablet Take 20 mEq by mouth 2 (two) times daily.     . metoprolol tartrate (LOPRESSOR) 25 MG tablet Take 25 mg by mouth 2 (two) times daily.     Marland Kitchen omeprazole (PRILOSEC) 20 MG capsule Take 1 capsule by mouth as needed.    . simvastatin (ZOCOR) 20 MG tablet Take 20 mg by mouth daily at 6 PM.     . triamterene-hydrochlorothiazide (MAXZIDE-25) 37.5-25 MG per tablet Take 1 tablet by mouth daily.      No current facility-administered medications for this visit.   Facility-Administered Medications Ordered in Other Visits  Medication Dose Route Frequency Provider Last Rate Last Dose  . fludeoxyglucose F - 18 (FDG) injection 12.94 milli Curie  12.94 milli Curie Intravenous Once PRN [place] Gleason, MD   12.94 milli Curie at 05/06/15 0830    OBJECTIVE: PHYSICAL EXAM:  GENERAL:  Well developed, well nourished, sitting comfortably in the exam room in no acute distress. MENTAL STATUS:  Alert and  oriented to person, place and time.  ENT:  Oropharynx clear without lesion.  Tongue normal. Mucous membranes moist.  RESPIRATORY:  Clear to auscultation without rales, wheezes or rhonchi. CARDIOVASCULAR:  Regular rate and rhythm without murmur, rub or gallop. BREAST: Right breast: Palpable area approximately 2 cm possibility of ecchymosis from previous mammogram and ultrasound guided biopsy.   Axillary nodes not palpable.   Left breast free of masses  Lumpectomy scar within normal limit ABDOMEN:  Soft, non-tender, with active bowel sounds, and no hepatosplenomegaly.  No  masses. BACK:  No CVA tenderness.  No tenderness on percussion of the back or rib cage. SKIN:  No rashes, ulcers or lesions. EXTREMITIES: No edema, no skin discoloration or tenderness.  No palpable cords. LYMPH NODES: No palpable cervical, supraclavicular, axillary or inguinal adenopathy  NEUROLOGICAL: Unremarkable. PSYCH:  Slightly apprehensive Filed Vitals:   05/08/15 1046  BP: 151/70  Pulse: 63  Temp: 96.3 F (35.7 C)     Body mass index is 33.22 kg/(m^2).    ECOG FS:0 - Asymptomatic  LAB RESULTS: Pathology report has been reviewed independently  All lab data has been reviewed [place] - Family History              mother had breast cancer at age 63.  Died                               at 60 - Social History              negative alcohol, negative tobacco - Additional Past Medical and hypertension   Surgical History            Hyperlipidemia                               Hysterectomy                               Hernia repair  ASSESSMENT:  Patient had a carcinoma of right breast was T2 N1 micrograms M0 tumor\ tumor was triple negative status post chemotherapy with Cytoxan and Taxotere followed by radiation therapy PET scan has been reviewed independently I will call Dr. Jamal Collin in next few minutes to discuss findings.  PET scan has been reviewed independently and reviewed with the patient.  Shows a right breast mass there is no evidence of distant metastases.  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 Possibility of mastectomy and then reevaluation   for further treatment.  2.patient complains of diarrhea had a colonoscopy done has been seen by gastroenterologist in the past Based on PET scan I do not have any explanation for that at  present time  Total duration of visit was 30 minutes.  50% or more time was spent in counseling patient and family regarding prognosis and options of treatment and available resources  Patient expressed understanding and was in agreement with this plan. She also understands that She can call clinic at any time with any questions, concerns, or complaints.    No matching staging information was found for the patient.  Forest Gleason, MD   05/08/2015 11:09 AM

## 2015-05-13 ENCOUNTER — Encounter: Payer: Self-pay | Admitting: General Surgery

## 2015-05-13 ENCOUNTER — Ambulatory Visit (INDEPENDENT_AMBULATORY_CARE_PROVIDER_SITE_OTHER): Payer: Medicare Other | Admitting: General Surgery

## 2015-05-13 VITALS — BP 126/64 | HR 58 | Resp 12 | Ht 62.0 in | Wt 179.0 lb

## 2015-05-13 DIAGNOSIS — C50911 Malignant neoplasm of unspecified site of right female breast: Secondary | ICD-10-CM

## 2015-05-13 NOTE — Patient Instructions (Addendum)
The patient is aware to call back for any questions or concerns.  atient is scheduled for surgery at [facility] on 05/22/15. She will pre admit at the hospital. Patient is aware of data and instructions.

## 2015-05-13 NOTE — Progress Notes (Signed)
Patient ID: Julie [name], female   DOB: 06-24-1941, 74 y.o.   MRN: [id number]  Chief Complaint  Patient presents with  . Other    discussion    HPI Julie [name] is a 74 y.o. female.  Here today to discuss breast surgery. 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 This is likely a recurrence of her previous cancer at this site. She did see Dr Oliva Bustard on 05-08-15. PET scan completed showing no evidence of axillary or distant disease.  I have reviewed the history of present illness with the patient.  HPI  Past Medical History  Diagnosis Date  . Hypertension   . Arthritis   . Personal history of malignant neoplasm of breast   . Cancer ([place]) 2007    Right breast  . Breast cancer ([place])   . Cancer of breast ([place]) 04/30/2015    Past Surgical History  Procedure Laterality Date  . Appendectomy    . Abdominal hysterectomy    . Breast biopsy Right 2007  . Breast lumpectomy Right 2007  . Knee surgery Right 03/2012  . Joint replacement Left 2011    knee  . Joint replacement Right 12/2011    knee    Family History  Problem Relation Age of Onset  . Breast cancer Mother     Social History Social History  Substance Use Topics  . Smoking status: Former Smoker    Types: Cigarettes  . Smokeless tobacco: Never Used  . Alcohol Use: No    No Known Allergies  Current Outpatient Prescriptions  Medication Sig Dispense Refill  . amLODipine (NORVASC) 10 MG tablet Take 1 tablet by mouth daily.    Marland Kitchen aspirin EC 81 MG tablet Take 1 tablet by mouth daily.    Marland Kitchen KLOR-CON M20 20 MEQ tablet Take 20 mEq by mouth 2 (two) times daily.     . metoprolol tartrate (LOPRESSOR) 25 MG tablet Take 25 mg by mouth 2 (two) times daily.     Marland Kitchen omeprazole (PRILOSEC) 20 MG capsule Take 1 capsule by mouth as needed.    . simvastatin (ZOCOR) 20 MG tablet Take 20 mg by mouth daily at 6 PM.     . triamterene-hydrochlorothiazide (MAXZIDE-25) 37.5-25 MG per tablet  Take 1 tablet by mouth daily.      No current facility-administered medications for this visit.    Review of Systems Review of Systems  Constitutional: Negative.   Respiratory: Negative.   Cardiovascular: Negative.     Blood pressure 126/64, pulse 58, resp. rate 12, height 5\' 2"  (1.575 m), weight 179 lb (81.194 kg).  Physical Exam Physical Exam  Constitutional: She is oriented to person, place, and time. She appears well-developed and well-nourished.  HENT:  Mouth/Throat: Oropharynx is clear and moist.  Eyes: Conjunctivae are normal. No scleral icterus.  Neck: Neck supple.  Cardiovascular: Normal rate, regular rhythm and normal heart sounds.   Pulmonary/Chest: Effort normal and breath sounds normal. Right breast exhibits no inverted nipple, no mass, no nipple discharge, no skin change and no tenderness. Left breast exhibits no inverted nipple, no mass, no nipple discharge, no skin change and no tenderness.  Same mass just above lumpectomy site on right breast.   Lymphadenopathy:    She has no cervical adenopathy.    She has no axillary adenopathy.  Neurological: She is alert and oriented to person, place, and time.  Skin: Skin is warm and dry.  Psychiatric: Her behavior is  normal.    Data Reviewed Progress notes and recent imaging studies reviewed.  PET pos mass right breast, no findings of mets. 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 PET scan showing no axillary or distant involvement.     Plan   Recommended mastectomy to include some axillary nodes-she has had SN biopsy in past. Procedure,risks and benefits explained. Pt is agreeable. Discussed surgery in detail. Schedule right breast mastectomy. Patient is scheduled for surgery at [facility] on 05/22/15. She will pre admit at the hospital. Patient is aware of data and instructions.      PCP:  Buzzy Han 05/13/2015, 3:27 PM

## 2015-05-16 ENCOUNTER — Encounter
Admission: RE | Admit: 2015-05-16 | Discharge: 2015-05-16 | Disposition: A | Discharge status: Home / Self Care | Payer: Medicare Other | Source: Ambulatory Visit | Attending: General Surgery | Admitting: General Surgery

## 2015-05-16 DIAGNOSIS — Z01812 Encounter for preprocedural laboratory examination: Secondary | ICD-10-CM | POA: Diagnosis present

## 2015-05-16 DIAGNOSIS — Z01818 Encounter for other preprocedural examination: Secondary | ICD-10-CM | POA: Insufficient documentation

## 2015-05-16 HISTORY — DX: Reserved for inherently not codable concepts without codable children: IMO0001

## 2015-05-16 HISTORY — DX: Gastro-esophageal reflux disease without esophagitis: K21.9

## 2015-05-16 HISTORY — DX: Anemia, unspecified: D64.9

## 2015-05-16 LAB — POTASSIUM: Potassium: 3.3 mmol/L — ABNORMAL LOW (ref 3.5–5.1)

## 2015-05-16 NOTE — Patient Instructions (Signed)
  Your procedure is scheduled on: 05/22/15 Thurs  Report to Day Surgery.2nd floor medical mall To find out your arrival time please call [phone] between 1PM - 3PM on Wed 05/21/15.  Remember: Instructions that are not followed completely may result in serious medical risk, up to and including death, or upon the discretion of your surgeon and anesthesiologist your surgery may need to be rescheduled.    _x___ 1. Do not eat food or drink liquids after midnight. No gum chewing or hard candies.     ____ 2. No Alcohol for 24 hours before or after surgery.   ____ 3. Bring all medications with you on the day of surgery if instructed.    __x__ 4. Notify your doctor if there is any change in your medical condition     (cold, fever, infections).     Do not wear jewelry, make-up, hairpins, clips or nail polish.  Do not wear lotions, powders, or perfumes. You may wear deodorant.  Do not shave 48 hours prior to surgery. Men may shave face and neck.  Do not bring valuables to the hospital.    [facility] is not responsible for any belongings or valuables.               Contacts, dentures or bridgework may not be worn into surgery.  Leave your suitcase in the car. After surgery it may be brought to your room.  For patients admitted to the hospital, discharge time is determined by your                treatment team.   Patients discharged the day of surgery will not be allowed to drive home.   Please read over the following fact sheets that you were given:      _x___ Take these medicines the morning of surgery with A SIP OF WATER:    1. amLODipine (NORVASC) 10 MG tablet  2. metoprolol tartrate (LOPRESSOR) 25 MG tablet  3. omeprazole (PRILOSEC) 20 MG capsule  4.  5.  6.  ____ Fleet Enema (as directed)   _x___ Use CHG Soap as directed  ____ Use inhalers on the day of surgery  ____ Stop metformin 2 days prior to surgery    ____ Take 1/2 of usual insulin dose the night before surgery  and none on the morning of surgery.   _x___ Stop Coumadin/Plavix/aspirin on stop aspirin 1 week before surgery  ____ Stop Anti-inflammatories on May take Tylenol as needed   ____ Stop supplements until after surgery.    ____ Bring C-Pap to the hospital.

## 2015-05-16 NOTE — Pre-Procedure Instructions (Signed)
Dr Angie Fava office faxed with potassium results and request for an increase in potassium supplement.

## 2015-05-19 ENCOUNTER — Telehealth: Payer: Self-pay | Admitting: *Deleted

## 2015-05-19 NOTE — Telephone Encounter (Signed)
Patient notified as instructed and verbalizes understanding.   We will proceed with surgery as scheduled for Thursday, 05-22-15.

## 2015-05-19 NOTE — Telephone Encounter (Signed)
-----   Message from Christene Lye, MD sent at 05/19/2015  8:25 AM EST ----- Increase K lor 52meq to TID from current bid till surgery.

## 2015-05-22 ENCOUNTER — Encounter: Payer: Self-pay | Admitting: *Deleted

## 2015-05-22 ENCOUNTER — Encounter
Admission: RE | Disposition: A | Discharge status: Home / Self Care | Payer: Self-pay | Source: Ambulatory Visit | Attending: General Surgery

## 2015-05-22 ENCOUNTER — Ambulatory Visit
Admission: RE | Admit: 2015-05-22 | Discharge: 2015-05-22 | Disposition: A | Discharge status: Home / Self Care | Payer: Medicare Other | Source: Ambulatory Visit | Attending: General Surgery | Admitting: General Surgery

## 2015-05-22 ENCOUNTER — Ambulatory Visit: Payer: Medicare Other | Admitting: Anesthesiology

## 2015-05-22 DIAGNOSIS — Z90710 Acquired absence of both cervix and uterus: Secondary | ICD-10-CM | POA: Insufficient documentation

## 2015-05-22 DIAGNOSIS — R0602 Shortness of breath: Secondary | ICD-10-CM | POA: Insufficient documentation

## 2015-05-22 DIAGNOSIS — Z853 Personal history of malignant neoplasm of breast: Secondary | ICD-10-CM | POA: Insufficient documentation

## 2015-05-22 DIAGNOSIS — C50911 Malignant neoplasm of unspecified site of right female breast: Secondary | ICD-10-CM | POA: Insufficient documentation

## 2015-05-22 DIAGNOSIS — Z79899 Other long term (current) drug therapy: Secondary | ICD-10-CM | POA: Diagnosis not present

## 2015-05-22 DIAGNOSIS — D649 Anemia, unspecified: Secondary | ICD-10-CM | POA: Diagnosis not present

## 2015-05-22 DIAGNOSIS — I10 Essential (primary) hypertension: Secondary | ICD-10-CM | POA: Insufficient documentation

## 2015-05-22 DIAGNOSIS — J45909 Unspecified asthma, uncomplicated: Secondary | ICD-10-CM | POA: Insufficient documentation

## 2015-05-22 DIAGNOSIS — C50919 Malignant neoplasm of unspecified site of unspecified female breast: Secondary | ICD-10-CM | POA: Diagnosis present

## 2015-05-22 DIAGNOSIS — Z96653 Presence of artificial knee joint, bilateral: Secondary | ICD-10-CM | POA: Insufficient documentation

## 2015-05-22 DIAGNOSIS — K219 Gastro-esophageal reflux disease without esophagitis: Secondary | ICD-10-CM | POA: Insufficient documentation

## 2015-05-22 DIAGNOSIS — Z7982 Long term (current) use of aspirin: Secondary | ICD-10-CM | POA: Diagnosis not present

## 2015-05-22 DIAGNOSIS — Z87891 Personal history of nicotine dependence: Secondary | ICD-10-CM | POA: Insufficient documentation

## 2015-05-22 DIAGNOSIS — M1990 Unspecified osteoarthritis, unspecified site: Secondary | ICD-10-CM | POA: Insufficient documentation

## 2015-05-22 DIAGNOSIS — C50411 Malignant neoplasm of upper-outer quadrant of right female breast: Secondary | ICD-10-CM | POA: Diagnosis not present

## 2015-05-22 HISTORY — PX: MASTECTOMY MODIFIED RADICAL: SHX5962

## 2015-05-22 LAB — POCT I-STAT 4, (NA,K, GLUC, HGB,HCT)
Glucose, Bld: 118 mg/dL — ABNORMAL HIGH (ref 65–99)
HEMATOCRIT: 38 % (ref 36.0–46.0)
Hemoglobin: 12.9 g/dL (ref 12.0–15.0)
Potassium: 3.3 mmol/L — ABNORMAL LOW (ref 3.5–5.1)
SODIUM: 140 mmol/L (ref 135–145)

## 2015-05-22 SURGERY — MASTECTOMY, MODIFIED RADICAL
Anesthesia: General | Laterality: Right | Wound class: Clean Contaminated

## 2015-05-22 MED ORDER — PROPOFOL 10 MG/ML IV BOLUS
INTRAVENOUS | Status: DC | PRN
  Administered 2015-05-22: 150 mg via INTRAVENOUS

## 2015-05-22 MED ORDER — ONDANSETRON HCL 4 MG/2ML IJ SOLN
INTRAMUSCULAR | Status: DC | PRN
  Administered 2015-05-22: 4 mg via INTRAVENOUS

## 2015-05-22 MED ORDER — METHYLENE BLUE 1 % INJ SOLN
INTRAMUSCULAR | Status: AC
  Filled 2015-05-22: qty 10

## 2015-05-22 MED ORDER — FENTANYL CITRATE (PF) 100 MCG/2ML IJ SOLN
INTRAMUSCULAR | Status: DC | PRN
  Administered 2015-05-22 (×2): 25 ug via INTRAVENOUS
  Administered 2015-05-22: 50 ug via INTRAVENOUS

## 2015-05-22 MED ORDER — ACETAMINOPHEN 10 MG/ML IV SOLN
INTRAVENOUS | Status: DC | PRN
  Administered 2015-05-22: 1000 mg via INTRAVENOUS

## 2015-05-22 MED ORDER — ACETAMINOPHEN 10 MG/ML IV SOLN
INTRAVENOUS | Status: AC
  Filled 2015-05-22: qty 100

## 2015-05-22 MED ORDER — CEFAZOLIN SODIUM-DEXTROSE 2-3 GM-% IV SOLR
INTRAVENOUS | Status: AC
  Administered 2015-05-22: 2 g via INTRAVENOUS
  Filled 2015-05-22: qty 50

## 2015-05-22 MED ORDER — CHLORHEXIDINE GLUCONATE 4 % EX LIQD: 1.0000 "application " | Freq: Once | CUTANEOUS | Status: DC

## 2015-05-22 MED ORDER — LIDOCAINE HCL (CARDIAC) 20 MG/ML IV SOLN
INTRAVENOUS | Status: DC | PRN
  Administered 2015-05-22: 80 mg via INTRAVENOUS

## 2015-05-22 MED ORDER — DEXAMETHASONE SODIUM PHOSPHATE 10 MG/ML IJ SOLN
INTRAMUSCULAR | Status: DC | PRN
  Administered 2015-05-22: 8 mg via INTRAVENOUS

## 2015-05-22 MED ORDER — ONDANSETRON HCL 4 MG/2ML IJ SOLN: 4.0000 mg | Freq: Once | INTRAMUSCULAR | Status: DC | PRN

## 2015-05-22 MED ORDER — CEFAZOLIN SODIUM-DEXTROSE 2-3 GM-% IV SOLR
2.0000 g | INTRAVENOUS | Status: AC
  Administered 2015-05-22: 2 g via INTRAVENOUS

## 2015-05-22 MED ORDER — LACTATED RINGERS IV SOLN
INTRAVENOUS | Status: DC
  Administered 2015-05-22: 09:00:00 via INTRAVENOUS

## 2015-05-22 MED ORDER — FENTANYL CITRATE (PF) 100 MCG/2ML IJ SOLN
INTRAMUSCULAR | Status: AC
  Administered 2015-05-22: 25 ug via INTRAVENOUS
  Filled 2015-05-22: qty 2

## 2015-05-22 MED ORDER — METHYLENE BLUE 1 % INJ SOLN
INTRAMUSCULAR | Status: DC | PRN
  Administered 2015-05-22: 3 mL

## 2015-05-22 MED ORDER — SODIUM CHLORIDE 0.9 % IJ SOLN
INTRAMUSCULAR | Status: AC
  Filled 2015-05-22: qty 10

## 2015-05-22 MED ORDER — FENTANYL CITRATE (PF) 100 MCG/2ML IJ SOLN
25.0000 ug | INTRAMUSCULAR | Status: DC | PRN
  Administered 2015-05-22 (×2): 25 ug via INTRAVENOUS

## 2015-05-22 MED ORDER — EPHEDRINE SULFATE 50 MG/ML IJ SOLN
INTRAMUSCULAR | Status: DC | PRN
  Administered 2015-05-22: 10 mg via INTRAVENOUS

## 2015-05-22 MED ORDER — OXYCODONE-ACETAMINOPHEN 5-325 MG PO TABS: 1.0000 | ORAL_TABLET | ORAL | Status: DC | PRN

## 2015-05-22 SURGICAL SUPPLY — 47 items
APPLIER CLIP 11 MED OPEN (CLIP)
APPLIER CLIP 13 LRG OPEN (CLIP)
BLADE SURG 10 STRL SS SAFETY (BLADE) ×2 IMPLANT
BULB RESERV EVAC DRAIN JP 100C (MISCELLANEOUS) ×2 IMPLANT
CANISTER SUCT 1200ML W/VALVE (MISCELLANEOUS) ×2 IMPLANT
CHLORAPREP W/TINT 26ML (MISCELLANEOUS) ×2 IMPLANT
CLIP APPLIE 11 MED OPEN (CLIP) IMPLANT
CLIP APPLIE 13 LRG OPEN (CLIP) IMPLANT
DEVICE DISSECT PLASMABLAD 3.0S (MISCELLANEOUS) ×1 IMPLANT
DRAIN CHANNEL JP 15F RND 16 (MISCELLANEOUS) ×2 IMPLANT
DRAPE LAPAROTOMY TRNSV 106X77 (MISCELLANEOUS) ×2 IMPLANT
DRSG TELFA 3X8 NADH (GAUZE/BANDAGES/DRESSINGS) ×2 IMPLANT
ELECT CAUTERY BLADE 6.4 (BLADE) ×2 IMPLANT
GAUZE FLUFF 18X24 1PLY STRL (GAUZE/BANDAGES/DRESSINGS) ×2 IMPLANT
GAUZE SPONGE 4X4 12PLY STRL (GAUZE/BANDAGES/DRESSINGS) IMPLANT
GLOVE BIO SURGEON STRL SZ7 (GLOVE) ×12 IMPLANT
GOWN STRL REUS W/ TWL LRG LVL3 (GOWN DISPOSABLE) ×3 IMPLANT
GOWN STRL REUS W/TWL LRG LVL3 (GOWN DISPOSABLE) ×3
HARMONIC SCALPEL FOCUS (MISCELLANEOUS) IMPLANT
KIT RM TURNOVER STRD PROC AR (KITS) ×2 IMPLANT
LABEL OR SOLS (LABEL) IMPLANT
LIQUID BAND (GAUZE/BANDAGES/DRESSINGS) ×2 IMPLANT
NS IRRIG 1000ML POUR BTL (IV SOLUTION) ×2 IMPLANT
PACK BASIN MINOR ARMC (MISCELLANEOUS) ×2 IMPLANT
PAD ABD DERMACEA PRESS 5X9 (GAUZE/BANDAGES/DRESSINGS) IMPLANT
PAD GROUND ADULT SPLIT (MISCELLANEOUS) ×2 IMPLANT
PIN SAFETY STRL (MISCELLANEOUS) ×2 IMPLANT
PLASMABLADE 3.0S (MISCELLANEOUS) ×2
SPONGE LAP 18X18 5 PK (GAUZE/BANDAGES/DRESSINGS) ×2 IMPLANT
STRIP CLOSURE SKIN 1/2X4 (GAUZE/BANDAGES/DRESSINGS) IMPLANT
SURGI-BRA LG (MISCELLANEOUS) IMPLANT
SURGI-BRA M (MISCELLANEOUS) ×2 IMPLANT
SUT ETHILON 2 0 FSLX (SUTURE) IMPLANT
SUT ETHILON 3-0 FS-10 30 BLK (SUTURE) ×2
SUT MNCRL AB 3-0 PS2 27 (SUTURE) ×4 IMPLANT
SUT SILK 2 0 (SUTURE)
SUT SILK 2-0 18XBRD TIE 12 (SUTURE) IMPLANT
SUT SILK 3 0 (SUTURE)
SUT SILK 3-0 18XBRD TIE 12 (SUTURE) IMPLANT
SUT SILK 4 0 (SUTURE)
SUT SILK 4-0 18XBRD TIE 12 (SUTURE) IMPLANT
SUT VIC AB 2-0 CT1 27 (SUTURE) ×2
SUT VIC AB 2-0 CT1 TAPERPNT 27 (SUTURE) ×2 IMPLANT
SUT VICRYL+ 3-0 144IN (SUTURE) ×2 IMPLANT
SUTURE EHLN 3-0 FS-10 30 BLK (SUTURE) ×1 IMPLANT
SWABSTK COMLB BENZOIN TINCTURE (MISCELLANEOUS) IMPLANT
TUBING CONNECTING 10 (TUBING) ×2 IMPLANT

## 2015-05-22 NOTE — Anesthesia Procedure Notes (Signed)
Procedure Name: LMA Insertion Date/Time: 05/22/2015 8:48 AM Performed by: Delaney Meigs Pre-anesthesia Checklist: Patient identified, Emergency Drugs available, Suction available, Patient being monitored and Timeout performed Patient Re-evaluated:Patient Re-evaluated prior to inductionOxygen Delivery Method: Circle system utilized and Simple face mask Preoxygenation: Pre-oxygenation with 100% oxygen Intubation Type: IV induction Ventilation: Mask ventilation without difficulty LMA: LMA inserted LMA Size: 3.5 Number of attempts: 1 Placement Confirmation: breath sounds checked- equal and bilateral and positive ETCO2 Tube secured with: Tape

## 2015-05-22 NOTE — Interval H&P Note (Signed)
History and Physical Interval Note:  05/22/2015 7:48 AM  Julie [name]  has presented today for surgery, with the diagnosis of CANCER RIGHT BREAST  The various methods of treatment have been discussed with the patient and family. After consideration of risks, benefits and other options for treatment, the patient has consented to  Procedure(s): MASTECTOMY MODIFIED RADICAL (Right) as a surgical intervention .  The patient's history has been reviewed, patient examined, no change in status, stable for surgery.  I have reviewed the patient's chart and labs.  Questions were answered to the patient's satisfaction.     Brylie Sneath G

## 2015-05-22 NOTE — H&P (View-Only) (Signed)
Patient ID: Julie [name], female   DOB: 04/16/1941, 73 y.o.   MRN: [id number]  Chief Complaint  Patient presents with  . Other    discussion    HPI Julie [name] is a 73 y.o. female.  Here today to discuss breast surgery. 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 This is likely a recurrence of her previous cancer at this site. She did see Dr Choksi on 05-08-15. PET scan completed showing no evidence of axillary or distant disease.  I have reviewed the history of present illness with the patient.  HPI  Past Medical History  Diagnosis Date  . Hypertension   . Arthritis   . Personal history of malignant neoplasm of breast   . Cancer (HCC) 2007    Right breast  . Breast cancer (HCC)   . Cancer of breast (HCC) 04/30/2015    Past Surgical History  Procedure Laterality Date  . Appendectomy    . Abdominal hysterectomy    . Breast biopsy Right 2007  . Breast lumpectomy Right 2007  . Knee surgery Right 03/2012  . Joint replacement Left 2011    knee  . Joint replacement Right 12/2011    knee    Family History  Problem Relation Age of Onset  . Breast cancer Mother     Social History Social History  Substance Use Topics  . Smoking status: Former Smoker    Types: Cigarettes  . Smokeless tobacco: Never Used  . Alcohol Use: No    No Known Allergies  Current Outpatient Prescriptions  Medication Sig Dispense Refill  . amLODipine (NORVASC) 10 MG tablet Take 1 tablet by mouth daily.    . aspirin EC 81 MG tablet Take 1 tablet by mouth daily.    . KLOR-CON M20 20 MEQ tablet Take 20 mEq by mouth 2 (two) times daily.     . metoprolol tartrate (LOPRESSOR) 25 MG tablet Take 25 mg by mouth 2 (two) times daily.     . omeprazole (PRILOSEC) 20 MG capsule Take 1 capsule by mouth as needed.    . simvastatin (ZOCOR) 20 MG tablet Take 20 mg by mouth daily at 6 PM.     . triamterene-hydrochlorothiazide (MAXZIDE-25) 37.5-25 MG per tablet  Take 1 tablet by mouth daily.      No current facility-administered medications for this visit.    Review of Systems Review of Systems  Constitutional: Negative.   Respiratory: Negative.   Cardiovascular: Negative.     Blood pressure 126/64, pulse 58, resp. rate 12, height 5' 2" (1.575 m), weight 179 lb (81.194 kg).  Physical Exam Physical Exam  Constitutional: She is oriented to person, place, and time. She appears well-developed and well-nourished.  HENT:  Mouth/Throat: Oropharynx is clear and moist.  Eyes: Conjunctivae are normal. No scleral icterus.  Neck: Neck supple.  Cardiovascular: Normal rate, regular rhythm and normal heart sounds.   Pulmonary/Chest: Effort normal and breath sounds normal. Right breast exhibits no inverted nipple, no mass, no nipple discharge, no skin change and no tenderness. Left breast exhibits no inverted nipple, no mass, no nipple discharge, no skin change and no tenderness.  Same mass just above lumpectomy site on right breast.   Lymphadenopathy:    She has no cervical adenopathy.    She has no axillary adenopathy.  Neurological: She is alert and oriented to person, place, and time.  Skin: Skin is warm and dry.  Psychiatric: Her behavior is   normal.    Data Reviewed Progress notes and recent imaging studies reviewed.  PET pos mass right breast, no findings of mets. 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 PET scan showing no axillary or distant involvement.     Plan   Recommended mastectomy to include some axillary nodes-she has had SN biopsy in past. Procedure,risks and benefits explained. Pt is agreeable. Discussed surgery in detail. Schedule right breast mastectomy. Patient is scheduled for surgery at [facility] on 05/22/15. She will pre admit at the hospital. Patient is aware of data and instructions.      PCP:  Hedrick, James  Alisea Matte G 05/13/2015, 3:27 PM    

## 2015-05-22 NOTE — Transfer of Care (Signed)
Immediate Anesthesia Transfer of Care Note  Patient: Julie [name]  Procedure(s) Performed: Procedure(s): MASTECTOMY MODIFIED RADICAL (Right)  Patient Location: PACU  Anesthesia Type:General  Level of Consciousness: sedated  Airway & Oxygen Therapy: Patient Spontanous Breathing and Patient connected to face mask oxygen  Post-op Assessment: Report given to RN and Post -op Vital signs reviewed and stable  Post vital signs: Reviewed and stable  Last Vitals: 10:36 63 hr 97% sat 134/61 21 resp 96.8 temp Filed Vitals:   05/22/15 0734  BP: 144/56  Pulse: 60  Temp: 36.6 C  Resp: 14    Complications: No apparent anesthesia complications

## 2015-05-22 NOTE — Anesthesia Preprocedure Evaluation (Signed)
Anesthesia Evaluation  Patient identified by MRN, date of birth, ID band Patient awake    Reviewed: Allergy & Precautions, H&P , NPO status , Patient's Chart, lab work & pertinent test results, reviewed documented beta blocker date and time   History of Anesthesia Complications Negative for: history of anesthetic complications  Airway Mallampati: III  TM Distance: >3 FB Neck ROM: full    Dental no notable dental hx. (+) Partial Upper, Missing, Partial Lower   Pulmonary shortness of breath and with exertion, asthma , neg sleep apnea, neg COPD, neg recent URI, former smoker,    Pulmonary exam normal breath sounds clear to auscultation       Cardiovascular Exercise Tolerance: Good hypertension, On Medications and On Home Beta Blockers (-) angina(-) CAD, (-) Past MI, (-) Cardiac Stents and (-) CABG Normal cardiovascular exam(-) dysrhythmias (-) Valvular Problems/Murmurs Rhythm:regular Rate:Normal     Neuro/Psych negative neurological ROS  negative psych ROS   GI/Hepatic Neg liver ROS, GERD  Medicated and Controlled,  Endo/Other  negative endocrine ROS  Renal/GU negative Renal ROS  negative genitourinary   Musculoskeletal   Abdominal   Peds  Hematology  (+) Blood dyscrasia, anemia ,   Anesthesia Other Findings Past Medical History:   Hypertension                                                 Arthritis                                                    Personal history of malignant neoplasm of brea*              Cancer ([place])                                    2007           Comment:Right breast   Breast cancer ([place])                                          Cancer of breast ([place])                          04/30/2015   Anemia                                                       Shortness of breath dyspnea                                  GERD (gastroesophageal reflux disease)                       Reproductive/Obstetrics negative OB ROS  Anesthesia Physical Anesthesia Plan  ASA: III  Anesthesia Plan: General   Post-op Pain Management:    Induction:   Airway Management Planned:   Additional Equipment:   Intra-op Plan:   Post-operative Plan:   Informed Consent: I have reviewed the patients History and Physical, chart, labs and discussed the procedure including the risks, benefits and alternatives for the proposed anesthesia with the patient or authorized representative who has indicated his/her understanding and acceptance.   Dental Advisory Given  Plan Discussed with: Anesthesiologist, CRNA and Surgeon  Anesthesia Plan Comments:         Anesthesia Quick Evaluation

## 2015-05-22 NOTE — Op Note (Signed)
Preop diagnosis: Recurrent carcinoma right breast  Post op diagnosis: Same  Operation: Right total mastectomy and axillary node biopsy    Surgeon: S.G.sankar  Assistant:     Anesthesia: Gen.  Complications: None  EBL: Less than 50 mL  Drains: JP drasin  Description: Patient was put to sleep with an LMA. The right breast and axilla were prepped and draped as sterile field after instillation of 6 mL of 50% diluted methylene blue in the central right breast. Patient has had previous sentinel node biopsy at the time of her initial cancer and therefore nuclear contrast was not utilized. Timeout was performed. Skin incision was mapped out and marked on the skin including the adequate margin above the recurrent cancer in the upper-outer quadrant. The incision also included the prior axillary incision for sentinel node. Skin incision was made with a plasma blade and the skin and subcutaneous tissue was elevated along the upper portion first to expose the lower border of the pectoralis muscle. From here on the axilla was entered and a single fat replaced node was identified with no other palpable or visible nodes. This node was excised out and sent separately to pathology. The inferior flap was then created the extent superiorly was to the infraclavicular space and and inferiorly to the upper rectus fascia. Bleeding was controlled with with the plasma blade and also ligatures of 3-0 Vicryl. The breast along the pectoralis fascia was then dissected off from the underlying pectoralis major muscle from medial to lateral aspect and removed. The lateral end was tagged for orientation and sent to pathology. Wound was irrigated and after ensuring hemostasis the wound was closed. A Jackson-Pratt drain was placed and brought out through a stab incision inferiorly in a fasting the skin with a nylon stitch. The subcutaneous tissue and the deep dermal layer were approximated with interrupted 3-0 Vicryl stitches. The  skin was closed with 2 running sutures of from subcuticular 3-0 Monocryl. Wound was covered with liqui  Ban. Telfa fluffs and ABDs and surgical bra used for dressing. Patient subsequently was extubated and returned recovery room stable condition.

## 2015-05-23 NOTE — Anesthesia Postprocedure Evaluation (Signed)
  Anesthesia Post-op Note  Patient: Julie [name]  Procedure(s) Performed: Procedure(s): MASTECTOMY MODIFIED RADICAL (Right)  Anesthesia type:General  Patient location: PACU  Post pain: Pain level controlled  Post assessment: Post-op Vital signs reviewed, Patient's Cardiovascular Status Stable, Respiratory Function Stable, Patent Airway and No signs of Nausea or vomiting  Post vital signs: Reviewed and stable  Last Vitals:  Filed Vitals:   05/22/15 1218  BP: 119/60  Pulse:   Temp:   Resp:     Level of consciousness: awake, alert  and patient cooperative  Complications: No apparent anesthesia complications

## 2015-05-26 LAB — SURGICAL PATHOLOGY

## 2015-05-27 ENCOUNTER — Encounter: Payer: Self-pay | Admitting: General Surgery

## 2015-05-27 ENCOUNTER — Ambulatory Visit (INDEPENDENT_AMBULATORY_CARE_PROVIDER_SITE_OTHER): Payer: Medicare Other | Admitting: General Surgery

## 2015-05-27 VITALS — BP 152/70 | HR 62 | Resp 12 | Ht 62.0 in | Wt 177.0 lb

## 2015-05-27 DIAGNOSIS — C50911 Malignant neoplasm of unspecified site of right female breast: Secondary | ICD-10-CM

## 2015-05-27 NOTE — Progress Notes (Signed)
Here today for her postoperative visit, right mastectomy on 05-22-15. States she is doing well. I have reviewed the history of present illness with the patient.  Serosanguinous fluid present in drain. Right breast incision healing well, no signs of infection.   Follow up in one week. Continue tracking fluid amounts in drain. If less than 30 mL in a day, she has been instructed to call the office to discuss drain removal.  Has appointment for Nov. 29th with Dr. Oliva Bustard to discuss further treatment options - chemotherapy vs anti-hormonal therapy.

## 2015-05-27 NOTE — Patient Instructions (Addendum)
The patient is aware to call back for any questions or concerns. Call if drainage is less than 30 ml a day total so we can remove the drain.

## 2015-05-28 ENCOUNTER — Encounter: Payer: Self-pay | Admitting: General Surgery

## 2015-05-29 ENCOUNTER — Ambulatory Visit (INDEPENDENT_AMBULATORY_CARE_PROVIDER_SITE_OTHER): Payer: Medicare Other | Admitting: *Deleted

## 2015-05-29 DIAGNOSIS — C50911 Malignant neoplasm of unspecified site of right female breast: Secondary | ICD-10-CM

## 2015-06-03 ENCOUNTER — Encounter: Payer: Self-pay | Admitting: General Surgery

## 2015-06-03 ENCOUNTER — Ambulatory Visit (INDEPENDENT_AMBULATORY_CARE_PROVIDER_SITE_OTHER): Payer: Medicare Other | Admitting: General Surgery

## 2015-06-03 VITALS — BP 142/72 | HR 74 | Resp 12 | Ht 62.0 in | Wt 167.0 lb

## 2015-06-03 DIAGNOSIS — C50911 Malignant neoplasm of unspecified site of right female breast: Secondary | ICD-10-CM

## 2015-06-03 NOTE — Patient Instructions (Signed)
Patient to return in one week. 

## 2015-06-03 NOTE — Progress Notes (Signed)
She requested her dressing to be changed around the JP drain. Redressed. The drain is clean, with no signs of infection noted. Follow up as scheduled.

## 2015-06-03 NOTE — Patient Instructions (Signed)
Continue drain record sheet 

## 2015-06-03 NOTE — Progress Notes (Signed)
Here today for her postoperative visit, right mastectomy on 05-22-15. States she is doing well. Drain sheet present. I have reviewed the history of present illness with the patient. Right mastectomy incision is clean and intact. Drainage is serous- 33ml yesterday, 49ml each day prior. New bulb placed for the drain. Patient to return in one week.

## 2015-06-10 ENCOUNTER — Inpatient Hospital Stay: Payer: Medicare Other | Attending: Oncology | Admitting: Oncology

## 2015-06-10 VITALS — BP 133/74 | HR 62 | Temp 97.7°F | Wt 179.2 lb

## 2015-06-10 DIAGNOSIS — M129 Arthropathy, unspecified: Secondary | ICD-10-CM | POA: Insufficient documentation

## 2015-06-10 DIAGNOSIS — Z9011 Acquired absence of right breast and nipple: Secondary | ICD-10-CM | POA: Diagnosis not present

## 2015-06-10 DIAGNOSIS — Z79899 Other long term (current) drug therapy: Secondary | ICD-10-CM | POA: Diagnosis not present

## 2015-06-10 DIAGNOSIS — Z87891 Personal history of nicotine dependence: Secondary | ICD-10-CM

## 2015-06-10 DIAGNOSIS — Z853 Personal history of malignant neoplasm of breast: Secondary | ICD-10-CM | POA: Diagnosis not present

## 2015-06-10 DIAGNOSIS — K219 Gastro-esophageal reflux disease without esophagitis: Secondary | ICD-10-CM | POA: Diagnosis not present

## 2015-06-10 DIAGNOSIS — C50911 Malignant neoplasm of unspecified site of right female breast: Secondary | ICD-10-CM | POA: Diagnosis not present

## 2015-06-10 DIAGNOSIS — Z171 Estrogen receptor negative status [ER-]: Secondary | ICD-10-CM | POA: Insufficient documentation

## 2015-06-10 DIAGNOSIS — Z7982 Long term (current) use of aspirin: Secondary | ICD-10-CM | POA: Insufficient documentation

## 2015-06-10 DIAGNOSIS — I10 Essential (primary) hypertension: Secondary | ICD-10-CM | POA: Diagnosis not present

## 2015-06-10 DIAGNOSIS — Z803 Family history of malignant neoplasm of breast: Secondary | ICD-10-CM | POA: Insufficient documentation

## 2015-06-10 DIAGNOSIS — C50311 Malignant neoplasm of lower-inner quadrant of right female breast: Secondary | ICD-10-CM

## 2015-06-10 DIAGNOSIS — D649 Anemia, unspecified: Secondary | ICD-10-CM

## 2015-06-10 DIAGNOSIS — R0609 Other forms of dyspnea: Secondary | ICD-10-CM | POA: Diagnosis not present

## 2015-06-10 NOTE — Progress Notes (Signed)
[place] @ [facility] [phone]  Fax:(336) [place]: 1941/03/11  MR#: [id number]  RCB#:[id number]  Patient Care Team: [place] Pink, MD as PCP - General (Family Medicine) Seeplaputhur Robinette Haines, MD (General Surgery)  CHIEF COMPLAINT:  Chief Complaint  Patient presents with  . Breast Cancer   Diagnosis - Chief Complaint             carcinoma of the breast, status post                               lumpectomy and sentinel lymph node biopsy.                               June 2007                               ER/PR negative                               Her-2 negative                                                              AJCC Staging pT2N1(mic)M0                               Stage grouping: IIb                                                              Completed 6 cycles Cytoxan/Taxotere                               October 2007.                               Right breast XRT.     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  All the margins were clear.  4 lymph nodes were negative for any tumor.  Invasive mammary carcinoma with apocrine features.  Margins are negative. 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  Patient came today further follow-up.  Patient had mastectomy done.  Previous PET scan was negative.  Patient still has a drainage tube.   REVIEW OF SYSTEMS:   GENERAL:  Feels good.  Active.  No fevers, sweats  or weight loss.  Patient is slightly  apprehensive PERFORMANCE STATUS (ECOG): 0 HEENT:  No visual changes, runny nose, sore throat, mouth sores or tenderness. Lungs: No shortness of breath or cough.  No hemoptysis. Cardiac:  No chest pain, palpitations, orthopnea, or PND. GI:  No nausea, vomiting, diarrhea, constipation, melena or hematochezia. GU:  No urgency, frequency, dysuria, or hematuria. Musculoskeletal:  : Patient had bilateral knee surgery Extremities:  No pain or swelling. Skin:  No rashes or skin changes. Neuro:  No headache, numbness or weakness, balance or coordination issues. Endocrine:  No diabetes, thyroid issues, hot flashes or night sweats. Psych:  No mood changes, depression or anxiety. Pain:  No focal pain. Review of systems:  All other systems reviewed and found to be negative.   As per HPI. Otherwise, a complete review of systems is negatve.  PAST MEDICAL HISTORY: Past Medical History  Diagnosis Date  . Hypertension   . Arthritis   . Personal history of malignant neoplasm of breast   . Cancer ([place]) 2007    Right breast  . Breast cancer ([place])   . Cancer of breast ([place]) 04/30/2015  . Anemia   . Shortness of breath dyspnea   . GERD (gastroesophageal reflux disease)     PAST SURGICAL HISTORY: Past Surgical History  Procedure Laterality Date  . Appendectomy    . Abdominal hysterectomy    . Breast biopsy Right 2007  . Breast lumpectomy Right 2007  . Knee surgery Right 03/2012  . Joint replacement Left 2011    knee  . Joint replacement Right 12/2011    knee  . Cataract extraction, bilateral Bilateral   . Mastectomy modified radical Right 05/22/2015    Procedure: MASTECTOMY MODIFIED RADICAL;  Surgeon: Christene Lye, MD;  Location: [facility] ORS;  Service: General;  Laterality: Right;    FAMILY HISTORY Family History  Problem Relation Age of Onset  . Breast cancer Mother        ADVANCED DIRECTIVES:  Patient does have advance healthcare directive, Patient   does not desire to make  any changes  HEALTH MAINTENANCE: Social History  Substance Use Topics  . Smoking status: Former Smoker    Types: Cigarettes    Quit date: 05/15/1980  . Smokeless tobacco: Never Used  . Alcohol Use: No      No Known Allergies  Current Outpatient Prescriptions  Medication Sig Dispense Refill  . amLODipine (NORVASC) 10 MG tablet Take 1 tablet by mouth daily.    Marland Kitchen aspirin EC 81 MG tablet Take 1 tablet by mouth daily.    . cyanocobalamin (,VITAMIN B-12,) 1000 MCG/ML injection Inject 1,000 mcg into the muscle every 30 (thirty) days.    Marland Kitchen KLOR-CON M20 20 MEQ tablet Take 20 mEq by mouth 2 (two) times daily.     . metoprolol tartrate (LOPRESSOR) 25 MG tablet Take 25 mg by mouth 2 (two) times daily.     Marland Kitchen omeprazole (PRILOSEC) 20 MG capsule Take 1 capsule by mouth as needed.    Marland Kitchen oxyCODONE-acetaminophen (ROXICET) 5-325 MG tablet Take 1 tablet by mouth every 4 (four) hours as needed. 30 tablet 0  . simvastatin (ZOCOR) 20 MG tablet Take 20 mg by mouth daily at 6 PM.     . triamterene-hydrochlorothiazide (MAXZIDE-25) 37.5-25 MG per tablet Take 1 tablet by mouth daily.      No current facility-administered medications for this visit.    OBJECTIVE: PHYSICAL EXAM:  GENERAL:  Well developed, well nourished, sitting comfortably in the  exam room in no acute distress. MENTAL STATUS:  Alert and oriented to person, place and time.  ENT:  Oropharynx clear without lesion.  Tongue normal. Mucous membranes moist.  RESPIRATORY:  Clear to auscultation without rales, wheezes or rhonchi. CARDIOVASCULAR:  Regular rate and rhythm without murmur, rub or gallop. BREAST: Right breast: Palpable area approximately 2 cm possibility of ecchymosis from previous mammogram and ultrasound guided biopsy.   Axillary nodes not palpable.   Left breast free of masses  Lumpectomy scar within normal limit ABDOMEN:  Soft, non-tender, with active bowel sounds, and no hepatosplenomegaly.  No masses. BACK:  No CVA tenderness.   No tenderness on percussion of the back or rib cage. SKIN:  No rashes, ulcers or lesions. EXTREMITIES: No edema, no skin discoloration or tenderness.  No palpable cords. LYMPH NODES: No palpable cervical, supraclavicular, axillary or inguinal adenopathy  NEUROLOGICAL: Unremarkable. PSYCH:  Slightly apprehensive Filed Vitals:   06/10/15 1500  BP: 133/74  Pulse: 62  Temp: 97.7 F (36.5 C)     Body mass index is 32.77 kg/(m^2).    ECOG FS:0 - Asymptomatic  LAB RESULTS: Pathology report has been reviewed independently  All lab data has been reviewed [place] - Family History              mother had breast cancer at age 61.  Died                               at 25 - Social History              negative alcohol, negative tobacco - Additional Past Medical and hypertension   Surgical History            Hyperlipidemia                               Hysterectomy                               Hernia repair  ASSESSMENT:  Patient had a carcinoma of right breast was T2 N1 micrograms M0 tumor\(2007) status post lumpectomy radiation therapy and chemotherapy. Now most likely recurrent disease triple negative.  T2 N0 M0 tumor Question is whether she would benefit from any adjuvant treatment Patient is still healing from the mastectomy site.  DATA  may suggest further chemotherapy might be helpful to delay metastases disease In a patient who had down prolonged disease-free interval. I would discussed all these issues in tumor conference as well as with patient Return appointment in few weeks  Total duration of visit was 30 minutes.  50% or more time was spent in counseling patient and family regarding prognosis and options of treatment and available resources  Patient expressed understanding and was in agreement with this plan. She also understands that She can call clinic at any time with any questions, concerns, or complaints.    No matching staging information was found for the  patient.  Forest Gleason, MD   06/10/2015 3:24 PM

## 2015-06-12 ENCOUNTER — Ambulatory Visit (INDEPENDENT_AMBULATORY_CARE_PROVIDER_SITE_OTHER): Payer: Medicare Other | Admitting: General Surgery

## 2015-06-12 ENCOUNTER — Encounter: Payer: Self-pay | Admitting: General Surgery

## 2015-06-12 VITALS — BP 138/78 | HR 68 | Resp 14 | Ht 62.0 in | Wt 179.0 lb

## 2015-06-12 DIAGNOSIS — C50911 Malignant neoplasm of unspecified site of right female breast: Secondary | ICD-10-CM

## 2015-06-12 NOTE — Patient Instructions (Addendum)
The patient is aware to call back for any questions or concerns. Follow up in 2 weeks.

## 2015-06-12 NOTE — Progress Notes (Signed)
Here today for postoperative visit, right mastectomy on 05-22-15. Drain sheet present. She states she is doing well. I have reviewed the history of present illness with the patient.  Incision is healing well, clean and intact with no evidence of infection. Drain with small amount of serous fluid present.   Drain removed and small dressing placed over area.    Follow up in 2 weeks. Prescription given for prosthesis and mastectomy bras.   PCP:  [place] Pink

## 2015-06-17 ENCOUNTER — Encounter: Payer: Self-pay | Admitting: Oncology

## 2015-06-26 ENCOUNTER — Ambulatory Visit (INDEPENDENT_AMBULATORY_CARE_PROVIDER_SITE_OTHER): Payer: Medicare Other | Admitting: General Surgery

## 2015-06-26 ENCOUNTER — Encounter: Payer: Self-pay | Admitting: General Surgery

## 2015-06-26 VITALS — BP 130/72 | HR 72 | Resp 14 | Ht 62.0 in | Wt 177.0 lb

## 2015-06-26 DIAGNOSIS — C50911 Malignant neoplasm of unspecified site of right female breast: Secondary | ICD-10-CM

## 2015-06-26 NOTE — Patient Instructions (Signed)
The patient is aware to call back for any questions or concerns.  

## 2015-06-26 NOTE — Progress Notes (Signed)
Here today for postoperative visit, right mastectomy on 05-22-15. She states she is doing well. I have reviewed the history of present illness with the patient.  Site well healed, no seroma and no infection noted. She has appt with Dr. Oliva Bustard next week. Decision to be made regarding chemo  Discussed possible port placement if chemo is decided on. The risks associated with central venous access including arterial, pulmonary and venous injury were reviewed. The possible need for additional treatment if pulmonary injury occurs (chest tube placement) was discussed.  This information has been scribed by Karie Fetch [place] Follow up 2 months. PCP:  Kary Kos

## 2015-07-01 ENCOUNTER — Inpatient Hospital Stay: Payer: Medicare Other | Attending: Oncology

## 2015-07-01 ENCOUNTER — Encounter: Payer: Self-pay | Admitting: Oncology

## 2015-07-01 ENCOUNTER — Inpatient Hospital Stay (HOSPITAL_BASED_OUTPATIENT_CLINIC_OR_DEPARTMENT_OTHER): Payer: Medicare Other | Admitting: Oncology

## 2015-07-01 VITALS — BP 129/75 | HR 59 | Temp 97.4°F | Resp 18 | Wt 179.0 lb

## 2015-07-01 DIAGNOSIS — Z79899 Other long term (current) drug therapy: Secondary | ICD-10-CM

## 2015-07-01 DIAGNOSIS — R0602 Shortness of breath: Secondary | ICD-10-CM | POA: Insufficient documentation

## 2015-07-01 DIAGNOSIS — Z171 Estrogen receptor negative status [ER-]: Secondary | ICD-10-CM

## 2015-07-01 DIAGNOSIS — Z862 Personal history of diseases of the blood and blood-forming organs and certain disorders involving the immune mechanism: Secondary | ICD-10-CM | POA: Insufficient documentation

## 2015-07-01 DIAGNOSIS — Z853 Personal history of malignant neoplasm of breast: Secondary | ICD-10-CM | POA: Diagnosis not present

## 2015-07-01 DIAGNOSIS — K219 Gastro-esophageal reflux disease without esophagitis: Secondary | ICD-10-CM | POA: Insufficient documentation

## 2015-07-01 DIAGNOSIS — Z9221 Personal history of antineoplastic chemotherapy: Secondary | ICD-10-CM | POA: Diagnosis not present

## 2015-07-01 DIAGNOSIS — Z7982 Long term (current) use of aspirin: Secondary | ICD-10-CM | POA: Diagnosis not present

## 2015-07-01 DIAGNOSIS — C50311 Malignant neoplasm of lower-inner quadrant of right female breast: Secondary | ICD-10-CM

## 2015-07-01 DIAGNOSIS — Z87891 Personal history of nicotine dependence: Secondary | ICD-10-CM | POA: Insufficient documentation

## 2015-07-01 DIAGNOSIS — M899 Disorder of bone, unspecified: Secondary | ICD-10-CM

## 2015-07-01 DIAGNOSIS — E785 Hyperlipidemia, unspecified: Secondary | ICD-10-CM

## 2015-07-01 DIAGNOSIS — C50911 Malignant neoplasm of unspecified site of right female breast: Secondary | ICD-10-CM | POA: Diagnosis present

## 2015-07-01 DIAGNOSIS — I10 Essential (primary) hypertension: Secondary | ICD-10-CM | POA: Insufficient documentation

## 2015-07-01 DIAGNOSIS — Z803 Family history of malignant neoplasm of breast: Secondary | ICD-10-CM | POA: Insufficient documentation

## 2015-07-01 DIAGNOSIS — Z9011 Acquired absence of right breast and nipple: Secondary | ICD-10-CM | POA: Diagnosis not present

## 2015-07-01 DIAGNOSIS — M129 Arthropathy, unspecified: Secondary | ICD-10-CM

## 2015-07-01 LAB — COMPREHENSIVE METABOLIC PANEL
ALK PHOS: 100 U/L (ref 38–126)
ALT: 14 U/L (ref 14–54)
ANION GAP: 7 (ref 5–15)
AST: 19 U/L (ref 15–41)
Albumin: 4.5 g/dL (ref 3.5–5.0)
BUN: 14 mg/dL (ref 6–20)
CALCIUM: 9.3 mg/dL (ref 8.9–10.3)
CO2: 28 mmol/L (ref 22–32)
Chloride: 102 mmol/L (ref 101–111)
Creatinine, Ser: 0.74 mg/dL (ref 0.44–1.00)
GFR calc non Af Amer: >60 mL/min (ref >60)
Glucose, Bld: 121 mg/dL — ABNORMAL HIGH (ref 65–99)
Potassium: 3.4 mmol/L — ABNORMAL LOW (ref 3.5–5.1)
SODIUM: 137 mmol/L (ref 135–145)
Total Bilirubin: 0.4 mg/dL (ref 0.3–1.2)
Total Protein: 7.4 g/dL (ref 6.5–8.1)

## 2015-07-01 LAB — CBC WITH DIFFERENTIAL/PLATELET
Basophils Absolute: 0 10*3/uL (ref 0–0.1)
Basophils Relative: 1 %
EOS ABS: 0.3 10*3/uL (ref 0–0.7)
EOS PCT: 7 %
HCT: 41.6 % (ref 35.0–47.0)
HEMOGLOBIN: 13.7 g/dL (ref 12.0–16.0)
LYMPHS ABS: 1.4 10*3/uL (ref 1.0–3.6)
Lymphocytes Relative: 30 %
MCH: 27.2 pg (ref 26.0–34.0)
MCHC: 33 g/dL (ref 32.0–36.0)
MCV: 82.5 fL (ref 80.0–100.0)
MONOS PCT: 11 %
Monocytes Absolute: 0.5 10*3/uL (ref 0.2–0.9)
Neutro Abs: 2.4 10*3/uL (ref 1.4–6.5)
Neutrophils Relative %: 51 %
PLATELETS: 161 10*3/uL (ref 150–440)
RBC: 5.04 MIL/uL (ref 3.80–5.20)
RDW: 15.5 % — ABNORMAL HIGH (ref 11.5–14.5)
WBC: 4.6 10*3/uL (ref 3.6–11.0)

## 2015-07-01 NOTE — Progress Notes (Signed)
Patient here for plan of care.

## 2015-07-01 NOTE — Progress Notes (Signed)
Julie [name] @ [facility] Telephone:(336) [phone]  Fax:(336) [place]: 05-20-1941  MR#: [id number]  JAS#:[id number]  Patient Care Team: Maryland Pink, MD as PCP - General (Family Medicine) Seeplaputhur Robinette Haines, MD (General Surgery)  CHIEF COMPLAINT:  Chief Complaint  Patient presents with  . Breast Cancer   Diagnosis - Chief Complaint             carcinoma of the breast, status post                               lumpectomy and sentinel lymph node biopsy.                               June 2007                               ER/PR negative                               Her-2 negative                                                              AJCC Staging pT2N1(mic)M0                               Stage grouping: IIb                                                              Completed 6 cycles Cytoxan/Taxotere                               October 2007.                               Right breast XRT.     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  All the margins were clear.  4 lymph nodes were negative for any tumor.  Invasive mammary carcinoma with apocrine features.  Margins are negative. 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   With  Apocrine features  Chest wall wound has healed completely. Patient is here to discuss further options of therapy  REVIEW OF SYSTEMS:   GENERAL:  Feels good.  Active.  No fevers, sweats or weight loss.  Patient is slightly apprehensive PERFORMANCE STATUS  (ECOG): 0 HEENT:  No visual changes, runny nose, sore throat, mouth sores or tenderness. Lungs: No shortness of breath or cough.  No hemoptysis. Cardiac:  No chest pain, palpitations, orthopnea, or PND. GI:  No nausea, vomiting, diarrhea, constipation, melena or hematochezia. GU:  No urgency, frequency, dysuria, or hematuria. Musculoskeletal:  : Patient had bilateral knee surgery Extremities:  No pain or swelling. Skin:  No rashes or skin changes. Neuro:  No headache, numbness or weakness, balance or coordination issues. Endocrine:  No diabetes, thyroid issues, hot flashes or night sweats. Psych:  No mood changes, depression or anxiety. Pain:  No focal pain. Review of systems:  All other systems reviewed and found to be negative.   As per HPI. Otherwise, a complete review of systems is negatve.  PAST MEDICAL HISTORY: Past Medical History  Diagnosis Date  . Hypertension   . Arthritis   . Personal history of malignant neoplasm of breast   . Cancer ([place]) 2007    Right breast  . Breast cancer ([place])   . Cancer of breast ([place]) 04/30/2015  . Anemia   . Shortness of breath dyspnea   . GERD (gastroesophageal reflux disease)     PAST SURGICAL HISTORY: Past Surgical History  Procedure Laterality Date  . Appendectomy    . Abdominal hysterectomy    . Breast biopsy Right 2007  . Breast lumpectomy Right 2007  . Knee surgery Right 03/2012  . Joint replacement Left 2011    knee  . Joint replacement Right 12/2011    knee  . Cataract extraction, bilateral Bilateral   . Mastectomy modified radical Right 05/22/2015    Procedure: MASTECTOMY MODIFIED RADICAL;  Surgeon: Christene Lye, MD;  Location: [facility] ORS;  Service: General;  Laterality: Right;    FAMILY HISTORY Family History  Problem Relation Age of Onset  . Breast cancer Mother        ADVANCED DIRECTIVES:  Patient does have advance healthcare directive, Patient   does not desire to make any changes  HEALTH  MAINTENANCE: Social History  Substance Use Topics  . Smoking status: Former Smoker    Types: Cigarettes    Quit date: 05/15/1980  . Smokeless tobacco: Never Used  . Alcohol Use: No      No Known Allergies  Current Outpatient Prescriptions  Medication Sig Dispense Refill  . amLODipine (NORVASC) 10 MG tablet Take 1 tablet by mouth daily.    Marland Kitchen aspirin EC 81 MG tablet Take 1 tablet by mouth daily.    . cyanocobalamin (,VITAMIN B-12,) 1000 MCG/ML injection Inject 1,000 mcg into the muscle every 30 (thirty) days.    Marland Kitchen KLOR-CON M20 20 MEQ tablet Take 20 mEq by mouth 2 (two) times daily.     . metoprolol tartrate (LOPRESSOR) 25 MG tablet Take 25 mg by mouth 2 (two) times daily.     Marland Kitchen omeprazole (PRILOSEC) 20 MG capsule Take 1 capsule by mouth as needed.    . simvastatin (ZOCOR) 20 MG tablet Take 20 mg by mouth daily at 6 PM.     . triamterene-hydrochlorothiazide (MAXZIDE-25) 37.5-25 MG per tablet Take 1 tablet by mouth daily.      No current facility-administered medications for this visit.    OBJECTIVE: PHYSICAL EXAM:  GENERAL:  Well developed, well nourished, sitting comfortably in the exam room in no acute distress. MENTAL STATUS:  Alert and oriented to person, place and time.  ENT:  Oropharynx clear without lesion.  Tongue normal. Mucous membranes moist.  RESPIRATORY:  Clear to auscultation without rales, wheezes or rhonchi. CARDIOVASCULAR:  Regular rate and rhythm without murmur, rub or gallop. BREAST: right breast: Status post mastectomy. Wound is healed completely.  There is no evidence of recurrent diseaseLeft breast free of masses  Lumpectomy scar within normal limit ABDOMEN:  Soft, non-tender, with active bowel sounds, and no hepatosplenomegaly.  No masses. BACK:  No CVA tenderness.  No tenderness on percussion of the back or rib cage. SKIN:  No rashes, ulcers or lesions. EXTREMITIES: No edema, no skin discoloration or tenderness.  No palpable cords. LYMPH NODES: No  palpable cervical, supraclavicular, axillary or inguinal adenopathy  NEUROLOGICAL: Unremarkable. PSYCH:  Slightly apprehensive Filed Vitals:   07/01/15 0924  BP: 129/75  Pulse: 59  Temp: 97.4 F (36.3 C)  Resp: 18     Body mass index is 32.73 kg/(m^2).    ECOG FS:0 - Asymptomatic  LAB RESULTS: Pathology report has been reviewed independently  All lab data has been reviewed [place] - Family History              mother had breast cancer at age 87.  Died                               at 64 - Social History              negative alcohol, negative tobacco - Additional Past Medical and hypertension   Surgical History            Hyperlipidemia                               Hysterectomy                               Hernia repair  ASSESSMENT:  Patient had a carcinoma of right breast was T2 N1 micrograms M0 tumor\(2007) status post lumpectomy radiation therapy and chemotherapy. Now most likely recurrent disease triple negative.  T2 N0 M0 tumor Question is whether she would benefit from any adjuvant treatment Patient is still healing from the mastectomy site. Since present problem was considered most likely secondary t recurrence.  As pathology had the same features has a previous pathology. She during fact that patient has apocrine features was decided that probably further chemotherapy may not be beneficial at present time We will continue to follow this patient. I discussed the situation with my colleagues at level as well as presented in multidisciplinary meeting and opinion was to observe the patient rather than do any adjuvant chemotherapy at present time. Reevaluate patient in 4 months     Patient expressed understanding and was in agreement with this plan. She also understands that She can call clinic at any time with any questions, concerns, or complaints.    No matching staging information was found for the patient.  Forest Gleason, MD   07/01/2015 9:31 AM

## 2015-07-02 LAB — CANCER ANTIGEN 27.29: CA 27.29: 6.4 U/mL (ref 0.0–38.6)

## 2015-08-05 ENCOUNTER — Encounter: Payer: Self-pay | Admitting: General Surgery

## 2015-08-14 ENCOUNTER — Encounter: Payer: Self-pay | Admitting: General Surgery

## 2015-08-27 ENCOUNTER — Encounter: Payer: Self-pay | Admitting: General Surgery

## 2015-08-27 ENCOUNTER — Ambulatory Visit (INDEPENDENT_AMBULATORY_CARE_PROVIDER_SITE_OTHER): Payer: Medicare Other | Admitting: General Surgery

## 2015-08-27 VITALS — BP 168/82 | HR 78 | Resp 14 | Ht 62.0 in | Wt 182.0 lb

## 2015-08-27 DIAGNOSIS — C50911 Malignant neoplasm of unspecified site of right female breast: Secondary | ICD-10-CM

## 2015-08-27 NOTE — Progress Notes (Signed)
Here today for 3 month postoperative visit, right mastectomy on 05-22-15. She states she is doing well. She does feel like her right arm gets stiff some times. I have reviewed the history of present illness with the patient.  Site well healed, no seroma and no infection noted. No lymphadenopathy in neck or axillae  Impression, CA right breast, triple negative. Initial lumpectomy/chemo/rad. Subsequent recurrence s/p mastectomy   Follow up in 3 months.  PCP:  [place] Pink This information has been scribed by Karie Fetch [place].

## 2015-08-27 NOTE — Patient Instructions (Signed)
The patient is aware to call back for any questions or concerns.  

## 2015-10-30 ENCOUNTER — Ambulatory Visit: Payer: Medicare Other | Admitting: Oncology

## 2015-10-30 ENCOUNTER — Other Ambulatory Visit: Payer: Medicare Other

## 2015-11-24 ENCOUNTER — Encounter: Payer: Self-pay | Admitting: General Surgery

## 2015-11-24 ENCOUNTER — Ambulatory Visit (INDEPENDENT_AMBULATORY_CARE_PROVIDER_SITE_OTHER): Payer: Medicare Other | Admitting: General Surgery

## 2015-11-24 VITALS — BP 124/70 | HR 65 | Resp 13 | Ht 62.0 in | Wt 176.0 lb

## 2015-11-24 DIAGNOSIS — C50911 Malignant neoplasm of unspecified site of right female breast: Secondary | ICD-10-CM | POA: Diagnosis not present

## 2015-11-24 NOTE — Patient Instructions (Addendum)
Patient to return in six months left breast screening mammogram. Continue self breast exams. Call office for any new breast issues or concerns.

## 2015-11-24 NOTE — Progress Notes (Signed)
Patient ID: Julie [name], female   DOB: [birth year], 75 y.o.   MRN: [id number]  Chief Complaint  Patient presents with  . Follow-up    right breast cancer    HPI Julie [name] is a 75 y.o. female here today for her follow up right breast cancer. She is 74mos post right breast mastectomy for recurrence of triple negative cancer Patient states she is doing well.   I have reviewed the history of present illness with the patient.   HPI  Past Medical History  Diagnosis Date  . Hypertension   . Arthritis   . Personal history of malignant neoplasm of breast   . Cancer ([place]) 2007    Right breast  . Breast cancer ([place])   . Cancer of breast ([place]) 04/30/2015  . Anemia   . Shortness of breath dyspnea   . GERD (gastroesophageal reflux disease)     Past Surgical History  Procedure Laterality Date  . Appendectomy    . Abdominal hysterectomy    . Breast biopsy Right 2007  . Breast lumpectomy Right 2007  . Knee surgery Right 03/2012  . Joint replacement Left 2011    knee  . Joint replacement Right 12/2011    knee  . Cataract extraction, bilateral Bilateral   . Mastectomy modified radical Right 05/22/2015    Procedure: MASTECTOMY MODIFIED RADICAL;  Surgeon: Christene Lye, MD;  Location: [facility] ORS;  Service: General;  Laterality: Right;    Family History  Problem Relation Age of Onset  . Breast cancer Mother     Social History Social History  Substance Use Topics  . Smoking status: Former Smoker    Types: Cigarettes    Quit date: 05/15/1980  . Smokeless tobacco: Never Used  . Alcohol Use: No    No Known Allergies  Current Outpatient Prescriptions  Medication Sig Dispense Refill  . amLODipine (NORVASC) 10 MG tablet Take 1 tablet by mouth daily.    Marland Kitchen aspirin EC 81 MG tablet Take 1 tablet by mouth daily.    . cyanocobalamin (,VITAMIN B-12,) 1000 MCG/ML injection Inject 1,000 mcg into the muscle every 30 (thirty) days.    Marland Kitchen KLOR-CON M20 20 MEQ tablet Take 20 mEq by  mouth 2 (two) times daily.     . metoprolol tartrate (LOPRESSOR) 25 MG tablet Take 25 mg by mouth 2 (two) times daily.     Marland Kitchen omeprazole (PRILOSEC) 20 MG capsule Take 1 capsule by mouth as needed.    . simvastatin (ZOCOR) 20 MG tablet Take 20 mg by mouth daily at 6 PM.     . triamterene-hydrochlorothiazide (MAXZIDE-25) 37.5-25 MG per tablet Take 1 tablet by mouth daily.      No current facility-administered medications for this visit.    Review of Systems Review of Systems  Constitutional: Negative.   Respiratory: Negative.   Cardiovascular: Negative.     Blood pressure 124/70, pulse 65, resp. rate 13, height 5\' 2"  (1.575 m), weight 176 lb (79.833 kg).  Physical Exam Physical Exam  Constitutional: She is oriented to person, place, and time. She appears well-developed and well-nourished.  Eyes: Conjunctivae are normal. No scleral icterus.  Neck: Neck supple.  Cardiovascular: Normal rate, regular rhythm and normal heart sounds.   Pulmonary/Chest: Effort normal and breath sounds normal. Left breast exhibits no inverted nipple, no mass, no nipple discharge, no skin change and no tenderness.  Right mastectomy site is clean and well healed. No signs of local recurrence.  Abdominal: Soft. Bowel  sounds are normal. There is no tenderness.  Lymphadenopathy:    She has no cervical adenopathy.    She has no axillary adenopathy.  Neurological: She is alert and oriented to person, place, and time.  Skin: Skin is warm and dry.    Data Reviewed Notes reviewed  Assessment    Stable exam. CA right breast, triple negative.      Plan    Patient to return in six months left breast screening mammogram.    PCP:  Kary Kos This information has been scribed by Gaspar Cola CMA.    SANKAR,SEEPLAPUTHUR G 11/24/2015, 9:36 AM

## 2015-12-03 ENCOUNTER — Ambulatory Visit: Payer: Medicare Other | Admitting: Oncology

## 2015-12-03 ENCOUNTER — Other Ambulatory Visit: Payer: Medicare Other

## 2015-12-03 ENCOUNTER — Inpatient Hospital Stay (HOSPITAL_BASED_OUTPATIENT_CLINIC_OR_DEPARTMENT_OTHER): Payer: Medicare Other | Admitting: Family Medicine

## 2015-12-03 ENCOUNTER — Inpatient Hospital Stay: Payer: Medicare Other | Attending: Family Medicine

## 2015-12-03 VITALS — BP 133/86 | HR 61 | Temp 97.9°F | Resp 18 | Wt 179.9 lb

## 2015-12-03 DIAGNOSIS — Z9221 Personal history of antineoplastic chemotherapy: Secondary | ICD-10-CM | POA: Diagnosis not present

## 2015-12-03 DIAGNOSIS — Z170 Estrogen receptor positive status [ER+]: Secondary | ICD-10-CM | POA: Diagnosis not present

## 2015-12-03 DIAGNOSIS — R002 Palpitations: Secondary | ICD-10-CM

## 2015-12-03 DIAGNOSIS — R0602 Shortness of breath: Secondary | ICD-10-CM | POA: Insufficient documentation

## 2015-12-03 DIAGNOSIS — C50311 Malignant neoplasm of lower-inner quadrant of right female breast: Secondary | ICD-10-CM

## 2015-12-03 DIAGNOSIS — Z9011 Acquired absence of right breast and nipple: Secondary | ICD-10-CM

## 2015-12-03 DIAGNOSIS — Z923 Personal history of irradiation: Secondary | ICD-10-CM | POA: Diagnosis not present

## 2015-12-03 DIAGNOSIS — Z7982 Long term (current) use of aspirin: Secondary | ICD-10-CM

## 2015-12-03 DIAGNOSIS — M129 Arthropathy, unspecified: Secondary | ICD-10-CM

## 2015-12-03 DIAGNOSIS — Z87891 Personal history of nicotine dependence: Secondary | ICD-10-CM | POA: Diagnosis not present

## 2015-12-03 DIAGNOSIS — K219 Gastro-esophageal reflux disease without esophagitis: Secondary | ICD-10-CM

## 2015-12-03 DIAGNOSIS — D649 Anemia, unspecified: Secondary | ICD-10-CM

## 2015-12-03 DIAGNOSIS — C50911 Malignant neoplasm of unspecified site of right female breast: Secondary | ICD-10-CM

## 2015-12-03 DIAGNOSIS — I10 Essential (primary) hypertension: Secondary | ICD-10-CM

## 2015-12-03 LAB — CBC WITH DIFFERENTIAL/PLATELET
BASOS ABS: 0 10*3/uL (ref 0–0.1)
BASOS PCT: 1 %
Eosinophils Absolute: 0.2 10*3/uL (ref 0–0.7)
Eosinophils Relative: 5 %
HEMATOCRIT: 38.4 % (ref 35.0–47.0)
HEMOGLOBIN: 12.8 g/dL (ref 12.0–16.0)
Lymphocytes Relative: 24 %
Lymphs Abs: 1.3 10*3/uL (ref 1.0–3.6)
MCH: 27.3 pg (ref 26.0–34.0)
MCHC: 33.3 g/dL (ref 32.0–36.0)
MCV: 81.9 fL (ref 80.0–100.0)
Monocytes Absolute: 0.5 10*3/uL (ref 0.2–0.9)
Monocytes Relative: 10 %
NEUTROS ABS: 3.2 10*3/uL (ref 1.4–6.5)
NEUTROS PCT: 60 %
Platelets: 172 10*3/uL (ref 150–440)
RBC: 4.69 MIL/uL (ref 3.80–5.20)
RDW: 16 % — AB (ref 11.5–14.5)
WBC: 5.2 10*3/uL (ref 3.6–11.0)

## 2015-12-03 LAB — COMPREHENSIVE METABOLIC PANEL
ALBUMIN: 4.8 g/dL (ref 3.5–5.0)
ALT: 16 U/L (ref 14–54)
AST: 19 U/L (ref 15–41)
Alkaline Phosphatase: 93 U/L (ref 38–126)
Anion gap: 6 (ref 5–15)
BILIRUBIN TOTAL: 0.4 mg/dL (ref 0.3–1.2)
BUN: 18 mg/dL (ref 6–20)
CO2: 27 mmol/L (ref 22–32)
Calcium: 9.5 mg/dL (ref 8.9–10.3)
Chloride: 105 mmol/L (ref 101–111)
Creatinine, Ser: 0.76 mg/dL (ref 0.44–1.00)
GFR calc Af Amer: >60 mL/min (ref >60)
GFR calc non Af Amer: >60 mL/min (ref >60)
GLUCOSE: 106 mg/dL — AB (ref 65–99)
POTASSIUM: 3.5 mmol/L (ref 3.5–5.1)
SODIUM: 138 mmol/L (ref 135–145)
TOTAL PROTEIN: 7.5 g/dL (ref 6.5–8.1)

## 2015-12-03 NOTE — Progress Notes (Signed)
[place]  Telephone:(336) [phone]  Fax:(336) [phone]     Julie [name] DOB: [birth year]  MR#: [id number]  FVC#:[id number]  Patient Care Team: Maryland Pink, MD as PCP - General (Family Medicine) Seeplaputhur Robinette Haines, MD (General Surgery)  CHIEF COMPLAINT:  Chief Complaint  Patient presents with  . Breast Cancer    follow up    INTERVAL HISTORY:  Patient is here for continued follow-up regarding carcinoma of the right breast. This was previously stated as a recurrent disease Dr. Oliva Bustard. Patient was originally diagnosed in 2007 with a triple negative disease of the right breast and then with recurrent disease in the same breast in November 2016. She underwent a right mastectomy. She reports overall feeling very well. She denies any acute complaints and continues to follow with Dr. Jamal Collin. Patient continues to perform monthly self breast exams. Dr. Angie Fava office has scheduled for mammogram in June 2017.  REVIEW OF SYSTEMS:   Review of Systems  Constitutional: Negative for fever, chills, weight loss, malaise/fatigue and diaphoresis.  HENT: Negative.   Eyes: Negative.   Respiratory: Negative for cough, hemoptysis, sputum production, shortness of breath and wheezing.   Cardiovascular: Positive for palpitations. Negative for chest pain, orthopnea, claudication, leg swelling and PND.       Following with Dr. Saralyn Pilar  Gastrointestinal: Negative for heartburn, nausea, vomiting, abdominal pain, diarrhea, constipation, blood in stool and melena.  Genitourinary: Negative.   Musculoskeletal: Negative.   Skin: Negative.   Neurological: Negative for dizziness, tingling, focal weakness, seizures and weakness.  Endo/Heme/Allergies: Does not bruise/bleed easily.  Psychiatric/Behavioral: Negative for depression. The patient is not nervous/anxious and does not have insomnia.     As per HPI. Otherwise, a complete review of systems is negatve.  ONCOLOGY HISTORY: Oncology  History   1. Carcinoma of the breast, status post lumpectomy and sentinel lymph node biopsy. June 2007ER/PR negative, Her-2 negative. PT2N1(mic)M0, STAGE IIb 2. Completed 6 cycles Cytoxan/Taxotere October 2007. Right breast XRT. 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 4. Right mastectomy, May 22, 2015. Location of mass was at 9:00. All the margins were clear. 4 lymph nodes were negative for any tumor. Invasive mammary carcinoma with apocrine features. Margins are negative. Informal vascular invasion present, rpT2 pNO cMO (triple negative).     Malignant neoplastic disease ([place])   04/30/2015 Initial Diagnosis Malignant neoplastic disease ([place])    Cancer of breast ([place])   04/30/2015 Initial Diagnosis Cancer of breast [facility])    PAST MEDICAL HISTORY: Past Medical History  Diagnosis Date  . Hypertension   . Arthritis   . Personal history of malignant neoplasm of breast   . Cancer ([place]) 2007    Right breast  . Breast cancer ([place])   . Cancer of breast ([place]) 04/30/2015  . Anemia   . Shortness of breath dyspnea   . GERD (gastroesophageal reflux disease)     PAST SURGICAL HISTORY: Past Surgical History  Procedure Laterality Date  . Appendectomy    . Abdominal hysterectomy    . Breast biopsy Right 2007  . Breast lumpectomy Right 2007  . Knee surgery Right 03/2012  . Joint replacement Left 2011    knee  . Joint replacement Right 12/2011    knee  . Cataract extraction, bilateral Bilateral   . Mastectomy modified radical Right 05/22/2015    Procedure: MASTECTOMY MODIFIED RADICAL;  Surgeon: Andreas Newport  Jamal Collin, MD;  Location: [facility] ORS;  Service: General;  Laterality: Right;    FAMILY HISTORY Family History  Problem Relation Age of Onset  . Breast cancer Mother     GYNECOLOGIC HISTORY:  No LMP recorded. Patient has had a hysterectomy.     ADVANCED  DIRECTIVES:    HEALTH MAINTENANCE: Social History  Substance Use Topics  . Smoking status: Former Smoker    Types: Cigarettes    Quit date: 05/15/1980  . Smokeless tobacco: Never Used  . Alcohol Use: No     Colonoscopy:  PAP:  Bone density:  Mammogram:12/2014  No Known Allergies  Current Outpatient Prescriptions  Medication Sig Dispense Refill  . amLODipine (NORVASC) 10 MG tablet Take 1 tablet by mouth daily.    Marland Kitchen aspirin EC 81 MG tablet Take 1 tablet by mouth daily.    . cyanocobalamin (,VITAMIN B-12,) 1000 MCG/ML injection Inject 1,000 mcg into the muscle every 30 (thirty) days.    Marland Kitchen KLOR-CON M20 20 MEQ tablet Take 20 mEq by mouth 2 (two) times daily.     . metoprolol tartrate (LOPRESSOR) 25 MG tablet Take 25 mg by mouth 2 (two) times daily.     Marland Kitchen omeprazole (PRILOSEC) 20 MG capsule Take 1 capsule by mouth as needed.    . simvastatin (ZOCOR) 20 MG tablet Take 20 mg by mouth daily at 6 PM.     . triamterene-hydrochlorothiazide (MAXZIDE-25) 37.5-25 MG per tablet Take 1 tablet by mouth daily.      No current facility-administered medications for this visit.    OBJECTIVE: BP 133/86 mmHg  Pulse 61  Temp(Src) 97.9 F (36.6 C) (Tympanic)  Resp 18  Wt 179 lb 14.3 oz (81.6 kg)   Body mass index is 32.89 kg/(m^2).    ECOG FS:0 - Asymptomatic  General: Well-developed, well-nourished, no acute distress. Eyes: Pink conjunctiva, anicteric sclera. HEENT: Normocephalic, moist mucous membranes, clear oropharnyx. Lungs: Clear to auscultation bilaterally. Heart: Regular rate and rhythm. No rubs, murmurs, or gallops. Abdomen: Soft, nontender, nondistended. No organomegaly noted, normoactive bowel sounds. Breast: Right mastectomy, left breast palpated in a circular manner in the sitting and supine positions.  No masses or fullness palpated.  Axilla palpated in both positions with no masses or fullness palpated.  Musculoskeletal: No edema, cyanosis, or clubbing. Neuro: Alert, answering  all questions appropriately. Cranial nerves grossly intact. Skin: No rashes or petechiae noted. Psych: Normal affect. Lymphatics: No cervical, clavicular, or axillary LAD.   LAB RESULTS:  Appointment on 12/03/2015  Component Date Value Ref Range Status  . WBC 12/03/2015 5.2  3.6 - 11.0 K/uL Final  . RBC 12/03/2015 4.69  3.80 - 5.20 MIL/uL Final  . Hemoglobin 12/03/2015 12.8  12.0 - 16.0 g/dL Final  . HCT 12/03/2015 38.4  35.0 - 47.0 % Final  . MCV 12/03/2015 81.9  80.0 - 100.0 fL Final  . MCH 12/03/2015 27.3  26.0 - 34.0 pg Final  . MCHC 12/03/2015 33.3  32.0 - 36.0 g/dL Final  . RDW 12/03/2015 16.0* 11.5 - 14.5 % Final  . Platelets 12/03/2015 172  150 - 440 K/uL Final  . Neutrophils Relative % 12/03/2015 60   Final  . Neutro Abs 12/03/2015 3.2  1.4 - 6.5 K/uL Final  . Lymphocytes Relative 12/03/2015 24   Final  . Lymphs Abs 12/03/2015 1.3  1.0 - 3.6 K/uL Final  . Monocytes Relative 12/03/2015 10   Final  . Monocytes Absolute 12/03/2015 0.5  0.2 - 0.9 K/uL Final  .  Eosinophils Relative 12/03/2015 5   Final  . Eosinophils Absolute 12/03/2015 0.2  0 - 0.7 K/uL Final  . Basophils Relative 12/03/2015 1   Final  . Basophils Absolute 12/03/2015 0.0  0 - 0.1 K/uL Final  . Sodium 12/03/2015 138  135 - 145 mmol/L Final  . Potassium 12/03/2015 3.5  3.5 - 5.1 mmol/L Final  . Chloride 12/03/2015 105  101 - 111 mmol/L Final  . CO2 12/03/2015 27  22 - 32 mmol/L Final  . Glucose, Bld 12/03/2015 106* 65 - 99 mg/dL Final  . BUN 12/03/2015 18  6 - 20 mg/dL Final  . Creatinine, Ser 12/03/2015 0.76  0.44 - 1.00 mg/dL Final  . Calcium 12/03/2015 9.5  8.9 - 10.3 mg/dL Final  . Total Protein 12/03/2015 7.5  6.5 - 8.1 g/dL Final  . Albumin 12/03/2015 4.8  3.5 - 5.0 g/dL Final  . AST 12/03/2015 19  15 - 41 U/L Final  . ALT 12/03/2015 16  14 - 54 U/L Final  . Alkaline Phosphatase 12/03/2015 93  38 - 126 U/L Final  . Total Bilirubin 12/03/2015 0.4  0.3 - 1.2 mg/dL Final  . GFR calc non Af Amer  12/03/2015 >60  >60 mL/min Final  . GFR calc Af Amer 12/03/2015 >60  >60 mL/min Final   Comment: (NOTE) The eGFR has been calculated using the CKD EPI equation. This calculation has not been validated in all clinical situations. eGFR's persistently <60 mL/min signify possible Chronic Kidney Disease.   . Anion gap 12/03/2015 6  5 - 15 Final    STUDIES: No results found.  ASSESSMENT:  History of stage IIb carcinoma of the right breast, triple negative disease, June 2007. Recurrent carcinoma of the right breast, triple negative disease, November 2016.  PLAN:  1. Recurrent carcinoma of the right breast. Original right breast cancer in 2007, status post lumpectomy, radiation therapy as well as chemotherapy with Taxotere and Cytoxan 6 cycles. Patient then had what is most consistent with a recurrent disease as pathology confirms the same features in both tumor samples. Patient is status post right mastectomy from November 2016. Per documentation from Dr. Oliva Bustard, he discussed this in the multidisciplinary meeting and reviewed situation with his colleagues. They decided at that time it was best to continue with close observation rather than adjuvant chemotherapy. Patient was agreeable. Patient was evaluated by Dr. Jamal Collin approximately 1 week ago, he then scheduled follow-up mammogram for June 2017.  Advised patient that we would continue with follow-up in approximately 4 months for continued observation.  Patient expressed understanding and was in agreement with this plan. She also understands that She can call clinic at any time with any questions, concerns, or complaints.   Dr. Oliva Bustard was available for consultation and review of plan of care for this patient.  Cancer of breast [facility])   Staging form: Breast, AJCC 7th Edition     Clinical: T2, N1 - Unsigned     Pathologic: No stage assigned - Unsigned       Staging comments: Local recurrence with triple negative disease    Evlyn Kanner, NP   12/03/2015 1:51 PM

## 2015-12-04 LAB — CANCER ANTIGEN 27.29: CA 27.29: 10.1 U/mL (ref 0.0–38.6)

## 2015-12-10 ENCOUNTER — Ambulatory Visit: Payer: Medicare Other | Admitting: Oncology

## 2015-12-10 ENCOUNTER — Other Ambulatory Visit: Payer: Medicare Other

## 2016-04-02 ENCOUNTER — Other Ambulatory Visit: Payer: Self-pay

## 2016-04-02 DIAGNOSIS — C50911 Malignant neoplasm of unspecified site of right female breast: Secondary | ICD-10-CM

## 2016-04-05 ENCOUNTER — Encounter (INDEPENDENT_AMBULATORY_CARE_PROVIDER_SITE_OTHER): Payer: Self-pay

## 2016-04-05 ENCOUNTER — Encounter: Payer: Self-pay | Admitting: Internal Medicine

## 2016-04-05 ENCOUNTER — Inpatient Hospital Stay: Payer: Medicare Other

## 2016-04-05 ENCOUNTER — Inpatient Hospital Stay: Payer: Medicare Other | Attending: Internal Medicine | Admitting: Internal Medicine

## 2016-04-05 DIAGNOSIS — I10 Essential (primary) hypertension: Secondary | ICD-10-CM | POA: Diagnosis not present

## 2016-04-05 DIAGNOSIS — E876 Hypokalemia: Secondary | ICD-10-CM | POA: Insufficient documentation

## 2016-04-05 DIAGNOSIS — R0602 Shortness of breath: Secondary | ICD-10-CM

## 2016-04-05 DIAGNOSIS — D649 Anemia, unspecified: Secondary | ICD-10-CM | POA: Insufficient documentation

## 2016-04-05 DIAGNOSIS — Z9011 Acquired absence of right breast and nipple: Secondary | ICD-10-CM

## 2016-04-05 DIAGNOSIS — Z9221 Personal history of antineoplastic chemotherapy: Secondary | ICD-10-CM

## 2016-04-05 DIAGNOSIS — R739 Hyperglycemia, unspecified: Secondary | ICD-10-CM | POA: Diagnosis not present

## 2016-04-05 DIAGNOSIS — C50911 Malignant neoplasm of unspecified site of right female breast: Secondary | ICD-10-CM

## 2016-04-05 DIAGNOSIS — Z853 Personal history of malignant neoplasm of breast: Secondary | ICD-10-CM | POA: Insufficient documentation

## 2016-04-05 DIAGNOSIS — C50811 Malignant neoplasm of overlapping sites of right female breast: Secondary | ICD-10-CM | POA: Insufficient documentation

## 2016-04-05 DIAGNOSIS — Z7982 Long term (current) use of aspirin: Secondary | ICD-10-CM

## 2016-04-05 DIAGNOSIS — Z171 Estrogen receptor negative status [ER-]: Secondary | ICD-10-CM | POA: Insufficient documentation

## 2016-04-05 DIAGNOSIS — K219 Gastro-esophageal reflux disease without esophagitis: Secondary | ICD-10-CM

## 2016-04-05 DIAGNOSIS — Z87891 Personal history of nicotine dependence: Secondary | ICD-10-CM | POA: Diagnosis not present

## 2016-04-05 DIAGNOSIS — M129 Arthropathy, unspecified: Secondary | ICD-10-CM | POA: Diagnosis not present

## 2016-04-05 DIAGNOSIS — Z79899 Other long term (current) drug therapy: Secondary | ICD-10-CM | POA: Diagnosis not present

## 2016-04-05 LAB — COMPREHENSIVE METABOLIC PANEL
ALT: 14 U/L (ref 14–54)
AST: 20 U/L (ref 15–41)
Albumin: 4.6 g/dL (ref 3.5–5.0)
Alkaline Phosphatase: 92 U/L (ref 38–126)
Anion gap: 7 (ref 5–15)
BUN: 14 mg/dL (ref 6–20)
CHLORIDE: 104 mmol/L (ref 101–111)
CO2: 28 mmol/L (ref 22–32)
Calcium: 9.7 mg/dL (ref 8.9–10.3)
Creatinine, Ser: 0.81 mg/dL (ref 0.44–1.00)
Glucose, Bld: 129 mg/dL — ABNORMAL HIGH (ref 65–99)
POTASSIUM: 3.4 mmol/L — AB (ref 3.5–5.1)
Sodium: 139 mmol/L (ref 135–145)
Total Bilirubin: 0.6 mg/dL (ref 0.3–1.2)
Total Protein: 7.3 g/dL (ref 6.5–8.1)

## 2016-04-05 LAB — CBC WITH DIFFERENTIAL/PLATELET
BASOS ABS: 0 10*3/uL (ref 0–0.1)
Basophils Relative: 1 %
EOS PCT: 5 %
Eosinophils Absolute: 0.2 10*3/uL (ref 0–0.7)
HCT: 38 % (ref 35.0–47.0)
Hemoglobin: 12.6 g/dL (ref 12.0–16.0)
LYMPHS PCT: 21 %
Lymphs Abs: 1 10*3/uL (ref 1.0–3.6)
MCH: 27.4 pg (ref 26.0–34.0)
MCHC: 33.3 g/dL (ref 32.0–36.0)
MCV: 82.3 fL (ref 80.0–100.0)
MONO ABS: 0.4 10*3/uL (ref 0.2–0.9)
Monocytes Relative: 9 %
Neutro Abs: 3.1 10*3/uL (ref 1.4–6.5)
Neutrophils Relative %: 64 %
PLATELETS: 183 10*3/uL (ref 150–440)
RBC: 4.61 MIL/uL (ref 3.80–5.20)
RDW: 15.5 % — AB (ref 11.5–14.5)
WBC: 4.8 10*3/uL (ref 3.6–11.0)

## 2016-04-05 NOTE — Progress Notes (Signed)
[place] OFFICE PROGRESS NOTE  Patient Care Team: [place] Pink, MD as PCP - General (Family Medicine) Seeplaputhur Robinette Haines, MD (General Surgery)  Cancer of breast [facility])   Staging form: Breast, AJCC 7th Edition   - Clinical: T2, N1 - Unsigned   - Pathologic: No stage assigned - Unsigned         Staging comments: Local recurrence with triple negative disease   Oncology History   1. Carcinoma of the breast, status post lumpectomy and sentinel lymph node biopsy. June 2007ER/PR negative, Her-2 negative. PT2N1(mic)M0, STAGE IIb 2. Completed 6 cycles Cytoxan/Taxotere October 2007. Right breast XRT. 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 4. Right mastectomy, May 22, 2015. Location of mass was at 9:00. All the margins were clear. 4 lymph nodes were negative for any tumor. Invasive mammary carcinoma with apocrine features. Margins are negative. Informal vascular invasion present, rpT2 pNO cMO (triple negative).     Malignant neoplastic disease ([place])   04/30/2015 Initial Diagnosis    Malignant neoplastic disease ([place])       Cancer of breast ([place])   04/30/2015 Initial Diagnosis    Cancer of breast [facility])        This is my first interaction with the patient as patient's primary oncologist has been Dr.Choksi. I reviewed the patient's prior charts/pertinent labs/imaging in detail; findings are summarized above.     INTERVAL HISTORY:  Julie [name] 75 y.o.  female pleasant patient above history of Breast cancer is here for follow-up.  Patient denies any hot flashes. Denies any unusual lumps or bumps. No chest pain or shortness of breath or cough. No nausea no vomiting. No headaches. No bone pain.   REVIEW OF SYSTEMS:  A complete 10 point review of system is done which is negative except mentioned above/history of present illness.   PAST  MEDICAL HISTORY :  Past Medical History:  Diagnosis Date  . Anemia   . Arthritis   . Breast cancer ([place])   . Cancer [facility]) 2007   Right breast  . Cancer of breast ([place]) 04/30/2015  . GERD (gastroesophageal reflux disease)   . Hypertension   . Personal history of malignant neoplasm of breast   . Shortness of breath dyspnea     PAST SURGICAL HISTORY :   Past Surgical History:  Procedure Laterality Date  . ABDOMINAL HYSTERECTOMY    . APPENDECTOMY    . BREAST BIOPSY Right 2007  . BREAST LUMPECTOMY Right 2007  . CATARACT EXTRACTION, BILATERAL Bilateral   . JOINT REPLACEMENT Left 2011   knee  . JOINT REPLACEMENT Right 12/2011   knee  . KNEE SURGERY Right 03/2012  . MASTECTOMY MODIFIED RADICAL Right 05/22/2015   Procedure: MASTECTOMY MODIFIED RADICAL;  Surgeon: Christene Lye, MD;  Location: [facility] ORS;  Service: General;  Laterality: Right;    FAMILY HISTORY :   Family History  Problem Relation Age of Onset  . Breast cancer Mother     SOCIAL HISTORY:   Social History  Substance Use Topics  . Smoking status: Former Smoker    Types: Cigarettes    Quit date: 05/15/1980  . Smokeless tobacco: Never Used  . Alcohol use No    ALLERGIES:  has No Known Allergies.  MEDICATIONS:  Current Outpatient Prescriptions  Medication Sig Dispense Refill  . amLODipine (NORVASC) 10 MG tablet Take 1 tablet by  mouth daily.    Marland Kitchen aspirin EC 81 MG tablet Take 1 tablet by mouth daily.    . cyanocobalamin (,VITAMIN B-12,) 1000 MCG/ML injection Inject 1,000 mcg into the muscle every 30 (thirty) days.    Marland Kitchen KLOR-CON M20 20 MEQ tablet Take 20 mEq by mouth 2 (two) times daily.     . metoprolol tartrate (LOPRESSOR) 25 MG tablet Take 25 mg by mouth daily.     Marland Kitchen omeprazole (PRILOSEC) 20 MG capsule Take 1 capsule by mouth as needed.    . simvastatin (ZOCOR) 20 MG tablet Take 20 mg by mouth daily at 6 PM.     . triamterene-hydrochlorothiazide (MAXZIDE-25) 37.5-25 MG per tablet Take 1 tablet by mouth  daily.      No current facility-administered medications for this visit.     PHYSICAL EXAMINATION: ECOG PERFORMANCE STATUS: 0 - Asymptomatic  BP 127/72 (BP Location: Left Arm, Patient Position: Sitting)   Pulse (!) 50   Temp (!) 96.1 F (35.6 C) (Tympanic)   Resp 20   Ht _0  (1.575 m)   Wt 176 lb 12.8 oz (80.2 kg)   BMI 32.34 kg/m   Filed Weights   04/05/16 1030  Weight: 176 lb 12.8 oz (80.2 kg)    GENERAL: Well-nourished well-developed; Alert, no distress and comfortable.   Alone.  EYES: no pallor or icterus OROPHARYNX: no thrush or ulceration; good dentition  NECK: supple, no masses felt LYMPH:  no palpable lymphadenopathy in the cervical, axillary or inguinal regions LUNGS: clear to auscultation and  No wheeze or crackles HEART/CVS: regular rate & rhythm and no murmurs; No lower extremity edema ABDOMEN:abdomen soft, non-tender and normal bowel sounds Musculoskeletal:no cyanosis of digits and no clubbing  PSYCH: alert & oriented x 3 with fluent speech NEURO: no focal motor/sensory deficits SKIN:  no rashes or significant lesions  Right and left BREAST exam [in the presence of nurse]- no unusual skin changes or dominant masses felt. Surgical scars noted.    LABORATORY DATA:  I have reviewed the data as listed    Component Value Date/Time   NA 139 04/05/2016 1011   NA 138 04/25/2015 1210   NA 141 03/15/2012 1210   K 3.4 (L) 04/05/2016 1011   K 3.2 (L) 03/21/2012 1256   CL 104 04/05/2016 1011   CL 102 03/15/2012 1210   CO2 28 04/05/2016 1011   CO2 33 (H) 03/15/2012 1210   GLUCOSE 129 (H) 04/05/2016 1011   GLUCOSE 113 (H) 03/15/2012 1210   BUN 14 04/05/2016 1011   BUN 15 04/25/2015 1210   BUN 10 03/15/2012 1210   CREATININE 0.81 04/05/2016 1011   CREATININE 0.75 03/15/2012 1210   CALCIUM 9.7 04/05/2016 1011   CALCIUM 9.9 03/15/2012 1210   PROT 7.3 04/05/2016 1011   PROT 7.1 04/25/2015 1210   ALBUMIN 4.6 04/05/2016 1011   ALBUMIN 4.7 04/25/2015 1210    AST 20 04/05/2016 1011   ALT 14 04/05/2016 1011   ALKPHOS 92 04/05/2016 1011   BILITOT 0.6 04/05/2016 1011   BILITOT 0.3 04/25/2015 1210   GFRNONAA >60 04/05/2016 1011   GFRNONAA >60 03/15/2012 1210   GFRAA >60 04/05/2016 1011   GFRAA >60 03/15/2012 1210    No results found for: SPEP, UPEP  Lab Results  Component Value Date   WBC 4.8 04/05/2016   NEUTROABS 3.1 04/05/2016   HGB 12.6 04/05/2016   HCT 38.0 04/05/2016   MCV 82.3 04/05/2016   PLT 183 04/05/2016  Chemistry      Component Value Date/Time   NA 139 04/05/2016 1011   NA 138 04/25/2015 1210   NA 141 03/15/2012 1210   K 3.4 (L) 04/05/2016 1011   K 3.2 (L) 03/21/2012 1256   CL 104 04/05/2016 1011   CL 102 03/15/2012 1210   CO2 28 04/05/2016 1011   CO2 33 (H) 03/15/2012 1210   BUN 14 04/05/2016 1011   BUN 15 04/25/2015 1210   BUN 10 03/15/2012 1210   CREATININE 0.81 04/05/2016 1011   CREATININE 0.75 03/15/2012 1210      Component Value Date/Time   CALCIUM 9.7 04/05/2016 1011   CALCIUM 9.9 03/15/2012 1210   ALKPHOS 92 04/05/2016 1011   AST 20 04/05/2016 1011   ALT 14 04/05/2016 1011   BILITOT 0.6 04/05/2016 1011   BILITOT 0.3 04/25/2015 1210       RADIOGRAPHIC STUDIES: I have personally reviewed the radiological images as listed and agreed with the findings in the report. No results found.   ASSESSMENT & PLAN:  Cancer of overlapping sites of right female breast ([place]) #  right breast ipsilateral breast recurrence [Nov 2016]; triple negative no adjuvant chemotherapy- clinically NED. Awaiting mammogram with Dr. Jamal Collin.  # MIld hypokalemia- 3.4 cont supp K supp  # Elevated Blood sugars- 129- recommend follow up with PCP.  # follow up in 6 months/ labs.    Orders Placed This Encounter  Procedures  . CBC with Differential    Standing Status:   Future    Standing Expiration Date:   04/05/2017  . Comprehensive metabolic panel    Standing Status:   Future    Standing Expiration Date:   04/05/2017    All questions were answered. The patient knows to call the clinic with any problems, questions or concerns.      Cammie Sickle, MD 04/06/2016 5:41 PM

## 2016-04-05 NOTE — Progress Notes (Signed)
Pt due for mammogram in Nov. 2017- per pt Dr. Jamal Collin will order the mammogram.  RN Chaperoned provider with Breast Exam

## 2016-04-05 NOTE — Assessment & Plan Note (Addendum)
#    right breast ipsilateral breast recurrence [Nov 2016]; triple negative no adjuvant chemotherapy- clinically NED. Awaiting mammogram with Dr. Jamal Collin.  # MIld hypokalemia- 3.4 cont supp K supp  # Elevated Blood sugars- 129- recommend follow up with PCP.  # follow up in 6 months/ labs.

## 2016-04-06 LAB — CANCER ANTIGEN 27.29: CA 27.29: 6.5 U/mL (ref 0.0–38.6)

## 2016-04-20 ENCOUNTER — Other Ambulatory Visit: Payer: Self-pay | Admitting: General Surgery

## 2016-04-20 DIAGNOSIS — Z1231 Encounter for screening mammogram for malignant neoplasm of breast: Secondary | ICD-10-CM

## 2016-04-30 ENCOUNTER — Other Ambulatory Visit: Payer: Self-pay | Admitting: *Deleted

## 2016-04-30 ENCOUNTER — Ambulatory Visit
Admission: RE | Admit: 2016-04-30 | Discharge: 2016-04-30 | Disposition: A | Discharge status: Home / Self Care | Payer: Self-pay | Source: Ambulatory Visit | Attending: *Deleted | Admitting: *Deleted

## 2016-04-30 DIAGNOSIS — Z9289 Personal history of other medical treatment: Secondary | ICD-10-CM

## 2016-05-03 ENCOUNTER — Other Ambulatory Visit: Payer: Self-pay | Admitting: General Surgery

## 2016-05-03 DIAGNOSIS — Z1239 Encounter for other screening for malignant neoplasm of breast: Secondary | ICD-10-CM

## 2016-05-04 ENCOUNTER — Encounter: Payer: Self-pay | Admitting: *Deleted

## 2016-06-08 ENCOUNTER — Ambulatory Visit
Admission: RE | Admit: 2016-06-08 | Discharge: 2016-06-08 | Disposition: A | Discharge status: Home / Self Care | Payer: Medicare Other | Source: Ambulatory Visit | Attending: General Surgery | Admitting: General Surgery

## 2016-06-08 DIAGNOSIS — Z1231 Encounter for screening mammogram for malignant neoplasm of breast: Secondary | ICD-10-CM | POA: Insufficient documentation

## 2016-06-08 DIAGNOSIS — Z1239 Encounter for other screening for malignant neoplasm of breast: Secondary | ICD-10-CM

## 2016-06-09 ENCOUNTER — Encounter: Payer: Self-pay | Admitting: *Deleted

## 2016-06-15 ENCOUNTER — Ambulatory Visit (INDEPENDENT_AMBULATORY_CARE_PROVIDER_SITE_OTHER): Payer: Medicare Other | Admitting: General Surgery

## 2016-06-15 ENCOUNTER — Encounter: Payer: Self-pay | Admitting: General Surgery

## 2016-06-15 VITALS — BP 124/76 | HR 78 | Resp 12 | Ht 62.0 in | Wt 171.0 lb

## 2016-06-15 DIAGNOSIS — C50911 Malignant neoplasm of unspecified site of right female breast: Secondary | ICD-10-CM | POA: Diagnosis not present

## 2016-06-15 NOTE — Progress Notes (Signed)
Patient ID: Julie [name], female   DOB: 02/15/1941, 75 y.o.   MRN: [id number]  Chief Complaint  Patient presents with  . Follow-up    mammogram    HPI Julie [name] is a 75 y.o. female.  here today for her follow up right breast cancer. She is 1 year post right breast mastectomy for recurrence of triple negative cancer. She had a left mammogram on 06-08-16. Patient states she is doing well.   I have reviewed the history of present illness with the patient.  HPI  Past Medical History:  Diagnosis Date  . Anemia   . Arthritis   . Breast cancer ([place]) 2007   right breast cancer  . Breast cancer ([place]) 2016   recurrent right breast cancer, triple negative  . Cancer [facility]) 2007   Right breast  . Cancer of breast ([place]) 04/30/2015  . GERD (gastroesophageal reflux disease)   . Hypertension   . Personal history of malignant neoplasm of breast   . Shortness of breath dyspnea     Past Surgical History:  Procedure Laterality Date  . ABDOMINAL HYSTERECTOMY    . APPENDECTOMY    . BREAST BIOPSY Right 2007   positive  . BREAST EXCISIONAL BIOPSY Left    neg  . BREAST LUMPECTOMY Right 2007  . CATARACT EXTRACTION, BILATERAL Bilateral   . JOINT REPLACEMENT Left 2011   knee  . JOINT REPLACEMENT Right 12/2011   knee  . KNEE SURGERY Right 03/2012  . MASTECTOMY Left 05/17/2015  . MASTECTOMY MODIFIED RADICAL Right 05/22/2015   Procedure: MASTECTOMY MODIFIED RADICAL;  Surgeon: Christene Lye, MD;  Location: [facility] ORS;  Service: General;  Laterality: Right;    Family History  Problem Relation Age of Onset  . Breast cancer Mother     Social History Social History  Substance Use Topics  . Smoking status: Former Smoker    Types: Cigarettes    Quit date: 05/15/1980  . Smokeless tobacco: Never Used  . Alcohol use No    No Known Allergies  Current Outpatient Prescriptions  Medication Sig Dispense Refill  . amLODipine (NORVASC) 10 MG tablet Take 1 tablet by mouth daily.    Marland Kitchen  aspirin EC 81 MG tablet Take 1 tablet by mouth daily.    . cyanocobalamin (,VITAMIN B-12,) 1000 MCG/ML injection Inject 1,000 mcg into the muscle every 30 (thirty) days.    Marland Kitchen KLOR-CON M20 20 MEQ tablet Take 20 mEq by mouth 2 (two) times daily.     . metoprolol tartrate (LOPRESSOR) 25 MG tablet Take 25 mg by mouth daily.     Marland Kitchen omeprazole (PRILOSEC) 20 MG capsule Take 1 capsule by mouth as needed.    . simvastatin (ZOCOR) 20 MG tablet Take 20 mg by mouth daily at 6 PM.     . triamterene-hydrochlorothiazide (MAXZIDE-25) 37.5-25 MG per tablet Take 1 tablet by mouth daily.      No current facility-administered medications for this visit.     Review of Systems Review of Systems  Constitutional: Negative.   Respiratory: Negative.   Cardiovascular: Negative.     Blood pressure 124/76, pulse 78, resp. rate 12, height 5\' 2"  (1.575 m), weight 171 lb (77.6 kg).  Physical Exam Physical Exam  Constitutional: She is oriented to person, place, and time. She appears well-developed and well-nourished.  HENT:  Mouth/Throat: Oropharynx is clear and moist.  Eyes: Conjunctivae are normal. No scleral icterus.  Neck: Neck supple.  Cardiovascular: Normal rate, regular rhythm and normal  heart sounds.   Pulmonary/Chest: Effort normal and breath sounds normal. Left breast exhibits no inverted nipple, no mass, no nipple discharge, no skin change and no tenderness.  Right mastectomy site well healed no sign of local recurrence    Abdominal: Soft. Bowel sounds are normal. There is no tenderness.  Lymphadenopathy:    She has no cervical adenopathy.    She has no axillary adenopathy.  Neurological: She is alert and oriented to person, place, and time.  Skin: Skin is warm and dry.  Psychiatric: Her behavior is normal.    Data Reviewed  Mammogram reviewed-stable   Assessment    Stable exam. CA right breast, triple negative    Plan    Patient to return in six months with an office visit. The patient  is aware to call back for any questions or concerns. Also being followed by oncology      This information has been scribed by Karie Fetch RN, BSN,BC.   Julie [name] 06/15/2016, 10:03 AM

## 2016-06-15 NOTE — Patient Instructions (Addendum)
The patient is aware to call back for any questions or concerns. Patient to return in six months

## 2016-07-16 DIAGNOSIS — E538 Deficiency of other specified B group vitamins: Secondary | ICD-10-CM | POA: Diagnosis not present

## 2016-08-18 DIAGNOSIS — K529 Noninfective gastroenteritis and colitis, unspecified: Secondary | ICD-10-CM | POA: Diagnosis not present

## 2016-08-18 DIAGNOSIS — G2581 Restless legs syndrome: Secondary | ICD-10-CM | POA: Diagnosis not present

## 2016-08-18 DIAGNOSIS — E538 Deficiency of other specified B group vitamins: Secondary | ICD-10-CM | POA: Diagnosis not present

## 2016-08-18 DIAGNOSIS — R202 Paresthesia of skin: Secondary | ICD-10-CM | POA: Diagnosis not present

## 2016-08-18 DIAGNOSIS — E119 Type 2 diabetes mellitus without complications: Secondary | ICD-10-CM | POA: Diagnosis not present

## 2016-08-26 DIAGNOSIS — R197 Diarrhea, unspecified: Secondary | ICD-10-CM | POA: Diagnosis not present

## 2016-08-31 DIAGNOSIS — H40003 Preglaucoma, unspecified, bilateral: Secondary | ICD-10-CM | POA: Diagnosis not present

## 2016-09-17 DIAGNOSIS — E538 Deficiency of other specified B group vitamins: Secondary | ICD-10-CM | POA: Diagnosis not present

## 2016-10-04 ENCOUNTER — Inpatient Hospital Stay (HOSPITAL_BASED_OUTPATIENT_CLINIC_OR_DEPARTMENT_OTHER): Payer: PPO | Admitting: Internal Medicine

## 2016-10-04 ENCOUNTER — Inpatient Hospital Stay: Payer: PPO | Attending: Internal Medicine

## 2016-10-04 VITALS — BP 147/73 | HR 47 | Temp 97.1°F | Resp 18 | Wt 171.5 lb

## 2016-10-04 DIAGNOSIS — Z79899 Other long term (current) drug therapy: Secondary | ICD-10-CM | POA: Insufficient documentation

## 2016-10-04 DIAGNOSIS — R0602 Shortness of breath: Secondary | ICD-10-CM | POA: Insufficient documentation

## 2016-10-04 DIAGNOSIS — Z9049 Acquired absence of other specified parts of digestive tract: Secondary | ICD-10-CM | POA: Insufficient documentation

## 2016-10-04 DIAGNOSIS — K219 Gastro-esophageal reflux disease without esophagitis: Secondary | ICD-10-CM | POA: Diagnosis not present

## 2016-10-04 DIAGNOSIS — Z853 Personal history of malignant neoplasm of breast: Secondary | ICD-10-CM | POA: Diagnosis not present

## 2016-10-04 DIAGNOSIS — Z923 Personal history of irradiation: Secondary | ICD-10-CM | POA: Insufficient documentation

## 2016-10-04 DIAGNOSIS — D696 Thrombocytopenia, unspecified: Secondary | ICD-10-CM

## 2016-10-04 DIAGNOSIS — I10 Essential (primary) hypertension: Secondary | ICD-10-CM | POA: Diagnosis not present

## 2016-10-04 DIAGNOSIS — Z7982 Long term (current) use of aspirin: Secondary | ICD-10-CM

## 2016-10-04 DIAGNOSIS — Z9011 Acquired absence of right breast and nipple: Secondary | ICD-10-CM | POA: Diagnosis not present

## 2016-10-04 DIAGNOSIS — C50811 Malignant neoplasm of overlapping sites of right female breast: Secondary | ICD-10-CM

## 2016-10-04 DIAGNOSIS — Z171 Estrogen receptor negative status [ER-]: Principal | ICD-10-CM

## 2016-10-04 DIAGNOSIS — Z87891 Personal history of nicotine dependence: Secondary | ICD-10-CM

## 2016-10-04 LAB — COMPREHENSIVE METABOLIC PANEL
ALT: 14 U/L (ref 14–54)
ANION GAP: 7 (ref 5–15)
AST: 20 U/L (ref 15–41)
Albumin: 4.3 g/dL (ref 3.5–5.0)
Alkaline Phosphatase: 87 U/L (ref 38–126)
BILIRUBIN TOTAL: 0.6 mg/dL (ref 0.3–1.2)
BUN: 17 mg/dL (ref 6–20)
CO2: 28 mmol/L (ref 22–32)
Calcium: 9.6 mg/dL (ref 8.9–10.3)
Chloride: 103 mmol/L (ref 101–111)
Creatinine, Ser: 0.77 mg/dL (ref 0.44–1.00)
GFR calc Af Amer: >60 mL/min (ref >60)
Glucose, Bld: 105 mg/dL — ABNORMAL HIGH (ref 65–99)
POTASSIUM: 3.5 mmol/L (ref 3.5–5.1)
Sodium: 138 mmol/L (ref 135–145)
TOTAL PROTEIN: 7.4 g/dL (ref 6.5–8.1)

## 2016-10-04 LAB — CBC WITH DIFFERENTIAL/PLATELET
BASOS PCT: 1 %
Basophils Absolute: 0 10*3/uL (ref 0–0.1)
Eosinophils Absolute: 0.3 10*3/uL (ref 0–0.7)
Eosinophils Relative: 6 %
HEMATOCRIT: 39.8 % (ref 35.0–47.0)
Hemoglobin: 13.1 g/dL (ref 12.0–16.0)
LYMPHS PCT: 19 %
Lymphs Abs: 0.9 10*3/uL — ABNORMAL LOW (ref 1.0–3.6)
MCH: 27.6 pg (ref 26.0–34.0)
MCHC: 32.8 g/dL (ref 32.0–36.0)
MCV: 83.9 fL (ref 80.0–100.0)
MONO ABS: 0.4 10*3/uL (ref 0.2–0.9)
MONOS PCT: 9 %
NEUTROS ABS: 3.1 10*3/uL (ref 1.4–6.5)
Neutrophils Relative %: 65 %
Platelets: 144 10*3/uL — ABNORMAL LOW (ref 150–440)
RBC: 4.74 MIL/uL (ref 3.80–5.20)
RDW: 15.8 % — AB (ref 11.5–14.5)
WBC: 4.7 10*3/uL (ref 3.6–11.0)

## 2016-10-04 MED ORDER — GABAPENTIN 100 MG PO CAPS
ORAL_CAPSULE | ORAL | 3 refills | Status: DC
Start: 2016-10-04 — End: 2017-01-03

## 2016-10-04 NOTE — Progress Notes (Signed)
RN Chaperoned provider with Breast Exam.   

## 2016-10-04 NOTE — Assessment & Plan Note (Addendum)
#    right breast ipsilateral breast recurrence [Nov 2016]; triple negative no adjuvant chemotherapy- clinically NED.   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 New prescription given.  # mild thrombocytopenia- 144; recheck at next visit.   # follow-up in 4 months with labs.

## 2016-10-04 NOTE — Progress Notes (Signed)
[place] OFFICE PROGRESS NOTE  Patient Care Team: [place] Pink, MD as PCP - General (Family Medicine) Seeplaputhur Robinette Haines, MD (General Surgery)  Cancer Staging Cancer of breast [facility]) Staging form: Breast, AJCC 7th Edition - Clinical: T2, N1 - Unsigned - Pathologic: No stage assigned - Unsigned Staging comments: Local recurrence with triple negative disease    Oncology History   1. Carcinoma of the breast, status post lumpectomy and sentinel lymph node biopsy. June 2007ER/PR negative, Her-2 negative. PT2N1(mic)M0, STAGE IIb 2. Completed 6 cycles Cytoxan/Taxotere October 2007. Right breast XRT. 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 4. Right mastectomy, May 22, 2015. Location of mass was at 9:00. All the margins were clear. 4 lymph nodes were negative for any tumor. Invasive mammary carcinoma with apocrine features. Margins are negative. Informal vascular invasion present, rpT2 pNO cMO (triple negative).     Malignant neoplastic disease ([place])   04/30/2015 Initial Diagnosis    Malignant neoplastic disease ([place])       Cancer of breast ([place])   04/30/2015 Initial Diagnosis    Cancer of breast ([place])       Carcinoma of overlapping sites of right breast in female, estrogen receptor negative ([place])   04/05/2016 Initial Diagnosis    Cancer of overlapping sites of right female breast [facility])         INTERVAL HISTORY:  Julie [name] 76 y.o.  female pleasant patient above history of Breast cancer is here for follow-up.  Patient complains of mild pain/ shooting across the right chest wall in the site of mastectomy. Denies any lumps or bumps.  Patient denies any hot flashes. Denies any unusual lumps or bumps. No chest pain or shortness of breath or cough. No nausea no vomiting. No headaches. No bone pain.   REVIEW OF SYSTEMS:  A complete  10 point review of system is done which is negative except mentioned above/history of present illness.   PAST MEDICAL HISTORY :  Past Medical History:  Diagnosis Date  . Anemia   . Arthritis   . Breast cancer ([place]) 2007   right breast cancer  . Breast cancer ([place]) 2016   recurrent right breast cancer, triple negative  . Cancer [facility]) 2007   Right breast  . Cancer of breast ([place]) 04/30/2015  . GERD (gastroesophageal reflux disease)   . Hypertension   . Personal history of malignant neoplasm of breast   . Shortness of breath dyspnea     PAST SURGICAL HISTORY :   Past Surgical History:  Procedure Laterality Date  . ABDOMINAL HYSTERECTOMY    . APPENDECTOMY    . BREAST BIOPSY Right 2007   positive  . BREAST EXCISIONAL BIOPSY Left    neg  . BREAST LUMPECTOMY Right 2007  . CATARACT EXTRACTION, BILATERAL Bilateral   . JOINT REPLACEMENT Left 2011   knee  . JOINT REPLACEMENT Right 12/2011   knee  . KNEE SURGERY Right 03/2012  . MASTECTOMY Left 05/17/2015  . MASTECTOMY MODIFIED RADICAL Right 05/22/2015   Procedure: MASTECTOMY MODIFIED RADICAL;  Surgeon: Christene Lye, MD;  Location: [facility] ORS;  Service: General;  Laterality: Right;    FAMILY HISTORY :   Family History  Problem Relation Age of Onset  . Breast cancer Mother     SOCIAL HISTORY:   Social History  Substance Use Topics  . Smoking status: Former Smoker  Types: Cigarettes    Quit date: 05/15/1980  . Smokeless tobacco: Never Used  . Alcohol use No    ALLERGIES:  has No Known Allergies.  MEDICATIONS:  Current Outpatient Prescriptions  Medication Sig Dispense Refill  . amLODipine (NORVASC) 10 MG tablet Take 1 tablet by mouth daily.    Marland Kitchen aspirin EC 81 MG tablet Take 1 tablet by mouth daily.    . cyanocobalamin (,VITAMIN B-12,) 1000 MCG/ML injection Inject 1,000 mcg into the muscle every 30 (thirty) days.    Marland Kitchen KLOR-CON M20 20 MEQ tablet Take 20 mEq by mouth 2 (two) times daily.     . metoprolol tartrate  (LOPRESSOR) 25 MG tablet Take 25 mg by mouth daily.     Marland Kitchen omeprazole (PRILOSEC) 20 MG capsule Take 1 capsule by mouth as needed.    . simvastatin (ZOCOR) 20 MG tablet Take 20 mg by mouth daily at 6 PM.     . triamterene-hydrochlorothiazide (MAXZIDE-25) 37.5-25 MG per tablet Take 1 tablet by mouth daily.     Marland Kitchen gabapentin (NEURONTIN) 100 MG capsule One pill at night time x1 week;if tolerating well;  then 1 pill AM & PM x 1 week; and then one pill AM; Noon; PM 90 capsule 3   No current facility-administered medications for this visit.     PHYSICAL EXAMINATION: ECOG PERFORMANCE STATUS: 0 - Asymptomatic  BP (!) 147/73 (BP Location: Left Arm, Patient Position: Sitting)   Pulse (!) 47   Temp 97.1 F (36.2 C) (Tympanic)   Resp 18   Wt 171 lb 8 oz (77.8 kg)   SpO2 98%   BMI 31.37 kg/m   Filed Weights   10/04/16 1135  Weight: 171 lb 8 oz (77.8 kg)    GENERAL: Well-nourished well-developed; Alert, no distress and comfortable.   Alone.  EYES: no pallor or icterus OROPHARYNX: no thrush or ulceration; good dentition  NECK: supple, no masses felt LYMPH:  no palpable lymphadenopathy in the cervical, axillary or inguinal regions LUNGS: clear to auscultation and  No wheeze or crackles HEART/CVS: regular rate & rhythm and no murmurs; No lower extremity edema ABDOMEN:abdomen soft, non-tender and normal bowel sounds Musculoskeletal:no cyanosis of digits and no clubbing  PSYCH: alert & oriented x 3 with fluent speech NEURO: no focal motor/sensory deficits SKIN:  no rashes or significant lesions  Right and left BREAST exam [in the presence of nurse]- no unusual skin changes or dominant masses felt. Surgical scars noted.    LABORATORY DATA:  I have reviewed the data as listed    Component Value Date/Time   NA 138 10/04/2016 1044   NA 138 04/25/2015 1210   NA 141 03/15/2012 1210   K 3.5 10/04/2016 1044   K 3.2 (L) 03/21/2012 1256   CL 103 10/04/2016 1044   CL 102 03/15/2012 1210   CO2 28  10/04/2016 1044   CO2 33 (H) 03/15/2012 1210   GLUCOSE 105 (H) 10/04/2016 1044   GLUCOSE 113 (H) 03/15/2012 1210   BUN 17 10/04/2016 1044   BUN 15 04/25/2015 1210   BUN 10 03/15/2012 1210   CREATININE 0.77 10/04/2016 1044   CREATININE 0.75 03/15/2012 1210   CALCIUM 9.6 10/04/2016 1044   CALCIUM 9.9 03/15/2012 1210   PROT 7.4 10/04/2016 1044   PROT 7.1 04/25/2015 1210   ALBUMIN 4.3 10/04/2016 1044   ALBUMIN 4.7 04/25/2015 1210   AST 20 10/04/2016 1044   ALT 14 10/04/2016 1044   ALKPHOS 87 10/04/2016 1044   BILITOT 0.6  10/04/2016 1044   BILITOT 0.3 04/25/2015 1210   GFRNONAA >60 10/04/2016 1044   GFRNONAA >60 03/15/2012 1210   GFRAA >60 10/04/2016 1044   GFRAA >60 03/15/2012 1210    No results found for: SPEP, UPEP  Lab Results  Component Value Date   WBC 4.7 10/04/2016   NEUTROABS 3.1 10/04/2016   HGB 13.1 10/04/2016   HCT 39.8 10/04/2016   MCV 83.9 10/04/2016   PLT 144 (L) 10/04/2016      Chemistry      Component Value Date/Time   NA 138 10/04/2016 1044   NA 138 04/25/2015 1210   NA 141 03/15/2012 1210   K 3.5 10/04/2016 1044   K 3.2 (L) 03/21/2012 1256   CL 103 10/04/2016 1044   CL 102 03/15/2012 1210   CO2 28 10/04/2016 1044   CO2 33 (H) 03/15/2012 1210   BUN 17 10/04/2016 1044   BUN 15 04/25/2015 1210   BUN 10 03/15/2012 1210   CREATININE 0.77 10/04/2016 1044   CREATININE 0.75 03/15/2012 1210      Component Value Date/Time   CALCIUM 9.6 10/04/2016 1044   CALCIUM 9.9 03/15/2012 1210   ALKPHOS 87 10/04/2016 1044   AST 20 10/04/2016 1044   ALT 14 10/04/2016 1044   BILITOT 0.6 10/04/2016 1044   BILITOT 0.3 04/25/2015 1210     IMPRESSION: No mammographic evidence of malignancy. A result letter of this screening mammogram will be mailed directly to the patient.  RECOMMENDATION: Screening mammogram in one year. (Code:SM-B-01Y)  BI-RADS CATEGORY  1: Negative.   Electronically Signed   By: Altamese Cabal M.D.   On: 06/08/2016  11:21   RADIOGRAPHIC STUDIES: I have personally reviewed the radiological images as listed and agreed with the findings in the report. No results found.   ASSESSMENT & PLAN:  Carcinoma of overlapping sites of right breast in female, estrogen receptor negative ([place]) #  right breast ipsilateral breast recurrence [Nov 2016]; triple negative no adjuvant chemotherapy- clinically NED.   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 New prescription given.  # mild thrombocytopenia- 144; recheck at next visit.   # follow-up in 4 months with labs.    Orders Placed This Encounter  Procedures  . CBC with Differential/Platelet    Standing Status:   Future    Standing Expiration Date:   10/04/2017  . Comprehensive metabolic panel    Standing Status:   Future    Standing Expiration Date:   10/04/2017   All questions were answered. The patient knows to call the clinic with any problems, questions or concerns.      Cammie Sickle, MD 10/05/2016 2:46 PM

## 2016-10-04 NOTE — Progress Notes (Signed)
Patient here today for follow up.  Patient c/o tenderness in right breast

## 2016-10-12 DIAGNOSIS — I10 Essential (primary) hypertension: Secondary | ICD-10-CM | POA: Diagnosis not present

## 2016-10-12 DIAGNOSIS — E7800 Pure hypercholesterolemia, unspecified: Secondary | ICD-10-CM | POA: Diagnosis not present

## 2016-10-12 DIAGNOSIS — I495 Sick sinus syndrome: Secondary | ICD-10-CM | POA: Diagnosis not present

## 2016-10-22 DIAGNOSIS — E538 Deficiency of other specified B group vitamins: Secondary | ICD-10-CM | POA: Diagnosis not present

## 2016-11-26 DIAGNOSIS — E538 Deficiency of other specified B group vitamins: Secondary | ICD-10-CM | POA: Diagnosis not present

## 2016-12-13 ENCOUNTER — Encounter: Payer: Self-pay | Admitting: General Surgery

## 2016-12-13 ENCOUNTER — Ambulatory Visit (INDEPENDENT_AMBULATORY_CARE_PROVIDER_SITE_OTHER): Payer: PPO | Admitting: General Surgery

## 2016-12-13 VITALS — BP 132/74 | HR 78 | Resp 12 | Ht 62.0 in | Wt 172.0 lb

## 2016-12-13 DIAGNOSIS — C50911 Malignant neoplasm of unspecified site of right female breast: Secondary | ICD-10-CM

## 2016-12-13 NOTE — Progress Notes (Signed)
Patient ID: Julie [name], female   DOB: 22-Oct-1940, 76 y.o.   MRN: [id number]  Chief Complaint  Patient presents with  . Follow-up    mammogram    HPI Julie [name] is a 76 y.o. female is here for a 6 month breast cancer follow up. 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 Patient reports tenderness and tightness in right axilla. Patient was prescribed gabapentin for post-mastectomy pain, reports feeling dizzy on it so she stopped taking it. HPI  Past Medical History:  Diagnosis Date  . Anemia   . Arthritis   . Breast cancer ([place]) 2007   right breast cancer  . Breast cancer ([place]) 2016   recurrent right breast cancer, triple negative  . Cancer [facility]) 2007   Right breast  . Cancer of breast ([place]) 04/30/2015  . GERD (gastroesophageal reflux disease)   . Hypertension   . Personal history of malignant neoplasm of breast   . Shortness of breath dyspnea     Past Surgical History:  Procedure Laterality Date  . ABDOMINAL HYSTERECTOMY    . APPENDECTOMY    . BREAST BIOPSY Right 2007   positive  . BREAST EXCISIONAL BIOPSY Left    neg  . BREAST LUMPECTOMY Right 2007  . CATARACT EXTRACTION, BILATERAL Bilateral   . JOINT REPLACEMENT Left 2011   knee  . JOINT REPLACEMENT Right 12/2011   knee  . KNEE SURGERY Right 03/2012  . MASTECTOMY Left 05/17/2015  . MASTECTOMY MODIFIED RADICAL Right 05/22/2015   Procedure: MASTECTOMY MODIFIED RADICAL;  Surgeon: Christene Lye, MD;  Location: [facility] ORS;  Service: General;  Laterality: Right;    Family History  Problem Relation Age of Onset  . Breast cancer Mother     Social History Social History  Substance Use Topics  . Smoking status: Former Smoker    Types: Cigarettes    Quit date: 05/15/1980  . Smokeless tobacco: Never Used  . Alcohol use No    No Known Allergies  Current Outpatient Prescriptions  Medication Sig Dispense Refill  . amLODipine (NORVASC) 10 MG tablet Take 1 tablet  by mouth daily.    Marland Kitchen aspirin EC 81 MG tablet Take 1 tablet by mouth daily.    . cyanocobalamin (,VITAMIN B-12,) 1000 MCG/ML injection Inject 1,000 mcg into the muscle every 30 (thirty) days.    Marland Kitchen KLOR-CON M20 20 MEQ tablet Take 20 mEq by mouth once.     . metoprolol tartrate (LOPRESSOR) 25 MG tablet Take 25 mg by mouth daily.     Marland Kitchen omeprazole (PRILOSEC) 20 MG capsule Take 1 capsule by mouth as needed.    . simvastatin (ZOCOR) 20 MG tablet Take 20 mg by mouth daily at 6 PM.     . triamterene-hydrochlorothiazide (MAXZIDE-25) 37.5-25 MG per tablet Take 1 tablet by mouth daily.     Marland Kitchen gabapentin (NEURONTIN) 100 MG capsule One pill at night time x1 week;if tolerating well;  then 1 pill AM & PM x 1 week; and then one pill AM; Noon; PM (Patient not taking: Reported on 12/13/2016) 90 capsule 3   No current facility-administered medications for this visit.     Review of Systems Review of Systems  Constitutional: Negative.   Respiratory: Negative.   Cardiovascular: Negative.    Blood pressure 132/74, pulse 78, resp. rate 12, height 5\' 2"  (1.575 m), weight 172 lb (78 kg).  Physical Exam Physical Exam  Constitutional: She is oriented to person, place, and  time. She appears well-developed and well-nourished.  Eyes: Conjunctivae are normal. No scleral icterus.  Neck: Neck supple.  Cardiovascular: Normal rate, regular rhythm and normal heart sounds.   Pulmonary/Chest: Effort normal and breath sounds normal. Right breast exhibits tenderness.    Abdominal: Soft. Bowel sounds are normal.  Lymphadenopathy:    She has no cervical adenopathy.    She has no axillary adenopathy.  Neurological: She is alert and oriented to person, place, and time.  Skin: Skin is warm and dry.  Psychiatric: She has a normal mood and affect. Her behavior is normal.    Data Reviewed Prior notes and images reviewed.  Assessment    History of recurrence of triple negative cancer/2 yr status post right mastectomy. Can  take Ibuprofen prn for pain   Stable Exam Last left breast mammogram on 06/08/16    Plan    Follow up in 6 months after Left breast mammogram.     HPI, Physical Exam, Assessment and Plan have been scribed under the direction and in the presence of Mckinley Jewel, MD.  Verlene Mayer, CMA  I have completed the exam and reviewed the above documentation for accuracy and completeness.  I agree with the above.  Haematologist has been used and any errors in dictation or transcription are unintentional.  Mehmet Scally G. Jamal Collin, M.D., F.A.C.S.  Junie Panning G 12/13/2016, 3:54 PM

## 2016-12-13 NOTE — Patient Instructions (Signed)
Follow up in 6 months after Left breast mammogram

## 2016-12-13 NOTE — Progress Notes (Deleted)
f °

## 2016-12-16 DIAGNOSIS — I10 Essential (primary) hypertension: Secondary | ICD-10-CM | POA: Diagnosis not present

## 2016-12-16 DIAGNOSIS — J0100 Acute maxillary sinusitis, unspecified: Secondary | ICD-10-CM | POA: Diagnosis not present

## 2016-12-24 DIAGNOSIS — E538 Deficiency of other specified B group vitamins: Secondary | ICD-10-CM | POA: Diagnosis not present

## 2017-01-03 ENCOUNTER — Inpatient Hospital Stay (HOSPITAL_BASED_OUTPATIENT_CLINIC_OR_DEPARTMENT_OTHER): Payer: PPO | Admitting: Internal Medicine

## 2017-01-03 ENCOUNTER — Inpatient Hospital Stay: Payer: PPO | Attending: Internal Medicine

## 2017-01-03 VITALS — BP 139/74 | HR 69 | Temp 98.2°F | Resp 20 | Ht 62.0 in | Wt 172.0 lb

## 2017-01-03 DIAGNOSIS — G629 Polyneuropathy, unspecified: Secondary | ICD-10-CM

## 2017-01-03 DIAGNOSIS — Z171 Estrogen receptor negative status [ER-]: Principal | ICD-10-CM

## 2017-01-03 DIAGNOSIS — Z79899 Other long term (current) drug therapy: Secondary | ICD-10-CM

## 2017-01-03 DIAGNOSIS — Z87891 Personal history of nicotine dependence: Secondary | ICD-10-CM

## 2017-01-03 DIAGNOSIS — Z803 Family history of malignant neoplasm of breast: Secondary | ICD-10-CM | POA: Insufficient documentation

## 2017-01-03 DIAGNOSIS — I10 Essential (primary) hypertension: Secondary | ICD-10-CM | POA: Insufficient documentation

## 2017-01-03 DIAGNOSIS — Z7982 Long term (current) use of aspirin: Secondary | ICD-10-CM

## 2017-01-03 DIAGNOSIS — C50811 Malignant neoplasm of overlapping sites of right female breast: Secondary | ICD-10-CM

## 2017-01-03 DIAGNOSIS — K219 Gastro-esophageal reflux disease without esophagitis: Secondary | ICD-10-CM

## 2017-01-03 DIAGNOSIS — Z853 Personal history of malignant neoplasm of breast: Secondary | ICD-10-CM | POA: Diagnosis not present

## 2017-01-03 DIAGNOSIS — Z9013 Acquired absence of bilateral breasts and nipples: Secondary | ICD-10-CM | POA: Insufficient documentation

## 2017-01-03 LAB — COMPREHENSIVE METABOLIC PANEL
ALT: 19 U/L (ref 14–54)
AST: 21 U/L (ref 15–41)
Albumin: 4.5 g/dL (ref 3.5–5.0)
Alkaline Phosphatase: 96 U/L (ref 38–126)
Anion gap: 8 (ref 5–15)
BUN: 20 mg/dL (ref 6–20)
CHLORIDE: 101 mmol/L (ref 101–111)
CO2: 29 mmol/L (ref 22–32)
Calcium: 9.6 mg/dL (ref 8.9–10.3)
Creatinine, Ser: 1.2 mg/dL — ABNORMAL HIGH (ref 0.44–1.00)
GFR calc non Af Amer: 43 mL/min — ABNORMAL LOW (ref >60)
GFR, EST AFRICAN AMERICAN: 50 mL/min — AB (ref >60)
Glucose, Bld: 103 mg/dL — ABNORMAL HIGH (ref 65–99)
POTASSIUM: 3.5 mmol/L (ref 3.5–5.1)
SODIUM: 138 mmol/L (ref 135–145)
Total Bilirubin: 0.6 mg/dL (ref 0.3–1.2)
Total Protein: 7.2 g/dL (ref 6.5–8.1)

## 2017-01-03 LAB — CBC WITH DIFFERENTIAL/PLATELET
BASOS ABS: 0 10*3/uL (ref 0–0.1)
Basophils Relative: 1 %
EOS ABS: 0.2 10*3/uL (ref 0–0.7)
EOS PCT: 5 %
HCT: 38.1 % (ref 35.0–47.0)
Hemoglobin: 13 g/dL (ref 12.0–16.0)
Lymphocytes Relative: 24 %
Lymphs Abs: 1.1 10*3/uL (ref 1.0–3.6)
MCH: 28.2 pg (ref 26.0–34.0)
MCHC: 34.2 g/dL (ref 32.0–36.0)
MCV: 82.5 fL (ref 80.0–100.0)
MONO ABS: 0.4 10*3/uL (ref 0.2–0.9)
Monocytes Relative: 9 %
Neutro Abs: 2.9 10*3/uL (ref 1.4–6.5)
Neutrophils Relative %: 61 %
PLATELETS: 199 10*3/uL (ref 150–440)
RBC: 4.62 MIL/uL (ref 3.80–5.20)
RDW: 15.1 % — AB (ref 11.5–14.5)
WBC: 4.7 10*3/uL (ref 3.6–11.0)

## 2017-01-03 NOTE — Progress Notes (Signed)
Pt here for breast ca f/u. Pt reports that she was unable to tolerate the gabapentin due to dizziness.

## 2017-01-03 NOTE — Assessment & Plan Note (Addendum)
#    right breast ipsilateral breast recurrence triple negative [Nov 2016] s/p Lumpectomy; no adjuvant chemotherapy- clinically NED. Mammogram left [dr.sanakr] in nov 2018.   # right post mastectomy neuropathic pain- stable; poor tolerance to Neurontin.  # slight increase in creatinin- 1.2;recommend increased fluid intake..  # follow-up in 6 months with labs.   CC; Dr.Hedrick, Jeneen Rinks

## 2017-01-03 NOTE — Progress Notes (Signed)
[place] OFFICE PROGRESS NOTE  Patient Care Team: [place] Pink, [name], MD (General Surgery)  Cancer Staging No matching staging information was found for the patient.    Oncology History   1. Carcinoma of the breast, status post lumpectomy and sentinel lymph node biopsy. June 2007ER/PR negative, Her-2 negative. PT2N1(mic)M0, STAGE IIb 2. Completed 6 cycles Cytoxan/Taxotere October 2007. Right breast XRT. 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 4. Right mastectomy, May 22, 2015. Location of mass was at 9:00. All the margins were clear. 4 lymph nodes were negative for any tumor. Invasive mammary carcinoma with apocrine features. Margins are negative. Informal vascular invasion present, rpT2 pNO cMO (triple negative).     Carcinoma of overlapping sites of right breast in female, estrogen receptor negative ([place])   04/05/2016 Initial Diagnosis    Cancer of overlapping sites of right female breast [facility])         INTERVAL HISTORY:  Julie [name] 76 y.o.  female pleasant patient above history of Breast cancer is here for follow-up.  Patient complains of mild pain/ shooting across the right chest wall in the site of mastectomy. patient stopped taking Neurontin- because of poor tolerance. She had extreme dizziness on Neurontin. Denies any lumps or bumps.  Patient denies any hot flashes. Denies any unusual lumps or bumps. No chest pain or shortness of breath or cough. No nausea no vomiting. No headaches. No bone pain.   REVIEW OF SYSTEMS:  A complete 10 point review of system is done which is negative except mentioned above/history of present illness.   PAST MEDICAL HISTORY :  Past Medical History:  Diagnosis Date  . Anemia   . Arthritis   . Breast cancer ([place]) 2007   right breast cancer   . Breast cancer ([place]) 2016   recurrent right breast cancer, triple negative  . Cancer [facility]) 2007   Right breast  . Cancer of breast ([place]) 04/30/2015  . GERD (gastroesophageal reflux disease)   . Hypertension   . Personal history of malignant neoplasm of breast   . Shortness of breath dyspnea     PAST SURGICAL HISTORY :   Past Surgical History:  Procedure Laterality Date  . ABDOMINAL HYSTERECTOMY    . APPENDECTOMY    . BREAST BIOPSY Right 2007   positive  . BREAST EXCISIONAL BIOPSY Left    neg  . BREAST LUMPECTOMY Right 2007  . CATARACT EXTRACTION, BILATERAL Bilateral   . JOINT REPLACEMENT Left 2011   knee  . JOINT REPLACEMENT Right 12/2011   knee  . KNEE SURGERY Right 03/2012  . MASTECTOMY Left 05/17/2015  . MASTECTOMY MODIFIED RADICAL Right 05/22/2015   Procedure: MASTECTOMY MODIFIED RADICAL;  Surgeon: Christene Lye, MD;  Location: [facility] ORS;  Service: General;  Laterality: Right;    FAMILY HISTORY :   Family History  Problem Relation Age of Onset  . Breast cancer Mother     SOCIAL HISTORY:   Social History  Substance Use Topics  . Smoking status: Former Smoker    Types: Cigarettes    Quit date: 05/15/1980  . Smokeless tobacco: Never Used  . Alcohol use No    ALLERGIES:  is allergic to gabapentin.  MEDICATIONS:  Current Outpatient Prescriptions  Medication Sig Dispense Refill  . amLODipine (NORVASC) 10 MG tablet Take 1 tablet  by mouth daily.    Marland Kitchen aspirin EC 81 MG tablet Take 1 tablet by mouth daily.    . cyanocobalamin (,VITAMIN B-12,) 1000 MCG/ML injection Inject 1,000 mcg into the muscle every 30 (thirty) days.    Marland Kitchen KLOR-CON M20 20 MEQ tablet Take 20 mEq by mouth once.     . metoprolol tartrate (LOPRESSOR) 25 MG tablet Take 25 mg by mouth daily.     . simvastatin (ZOCOR) 20 MG tablet Take 20 mg by mouth daily at 6 PM.     . triamterene-hydrochlorothiazide (MAXZIDE-25) 37.5-25 MG per tablet Take 1 tablet by mouth daily.      No current  facility-administered medications for this visit.     PHYSICAL EXAMINATION: ECOG PERFORMANCE STATUS: 0 - Asymptomatic  BP 139/74 (BP Location: Left Arm, Patient Position: Sitting)   Pulse 69   Temp 98.2 F (36.8 C) (Tympanic)   Resp 20   Ht _0  (1.575 m)   Wt 172 lb (78 kg)   BMI 31.46 kg/m   Filed Weights   01/03/17 1451  Weight: 172 lb (78 kg)    GENERAL: Well-nourished well-developed; Alert, no distress and comfortable.   Alone.  EYES: no pallor or icterus OROPHARYNX: no thrush or ulceration; good dentition  NECK: supple, no masses felt LYMPH:  no palpable lymphadenopathy in the cervical, axillary or inguinal regions LUNGS: clear to auscultation and  No wheeze or crackles HEART/CVS: regular rate & rhythm and no murmurs; No lower extremity edema ABDOMEN:abdomen soft, non-tender and normal bowel sounds Musculoskeletal:no cyanosis of digits and no clubbing  PSYCH: alert & oriented x 3 with fluent speech NEURO: no focal motor/sensory deficits SKIN:  no rashes or significant lesions    LABORATORY DATA:  I have reviewed the data as listed    Component Value Date/Time   NA 138 01/03/2017 1426   NA 138 04/25/2015 1210   NA 141 03/15/2012 1210   K 3.5 01/03/2017 1426   K 3.2 (L) 03/21/2012 1256   CL 101 01/03/2017 1426   CL 102 03/15/2012 1210   CO2 29 01/03/2017 1426   CO2 33 (H) 03/15/2012 1210   GLUCOSE 103 (H) 01/03/2017 1426   GLUCOSE 113 (H) 03/15/2012 1210   BUN 20 01/03/2017 1426   BUN 15 04/25/2015 1210   BUN 10 03/15/2012 1210   CREATININE 1.20 (H) 01/03/2017 1426   CREATININE 0.75 03/15/2012 1210   CALCIUM 9.6 01/03/2017 1426   CALCIUM 9.9 03/15/2012 1210   PROT 7.2 01/03/2017 1426   PROT 7.1 04/25/2015 1210   ALBUMIN 4.5 01/03/2017 1426   ALBUMIN 4.7 04/25/2015 1210   AST 21 01/03/2017 1426   ALT 19 01/03/2017 1426   ALKPHOS 96 01/03/2017 1426   BILITOT 0.6 01/03/2017 1426   BILITOT 0.3 04/25/2015 1210   GFRNONAA 43 (L) 01/03/2017 1426    GFRNONAA >60 03/15/2012 1210   GFRAA 50 (L) 01/03/2017 1426   GFRAA >60 03/15/2012 1210    No results found for: SPEP, UPEP  Lab Results  Component Value Date   WBC 4.7 01/03/2017   NEUTROABS 2.9 01/03/2017   HGB 13.0 01/03/2017   HCT 38.1 01/03/2017   MCV 82.5 01/03/2017   PLT 199 01/03/2017      Chemistry      Component Value Date/Time   NA 138 01/03/2017 1426   NA 138 04/25/2015 1210   NA 141 03/15/2012 1210   K 3.5 01/03/2017 1426   K 3.2 (L) 03/21/2012 1256   CL  101 01/03/2017 1426   CL 102 03/15/2012 1210   CO2 29 01/03/2017 1426   CO2 33 (H) 03/15/2012 1210   BUN 20 01/03/2017 1426   BUN 15 04/25/2015 1210   BUN 10 03/15/2012 1210   CREATININE 1.20 (H) 01/03/2017 1426   CREATININE 0.75 03/15/2012 1210      Component Value Date/Time   CALCIUM 9.6 01/03/2017 1426   CALCIUM 9.9 03/15/2012 1210   ALKPHOS 96 01/03/2017 1426   AST 21 01/03/2017 1426   ALT 19 01/03/2017 1426   BILITOT 0.6 01/03/2017 1426   BILITOT 0.3 04/25/2015 1210     IMPRESSION: No mammographic evidence of malignancy. A result letter of this screening mammogram will be mailed directly to the patient.  RECOMMENDATION: Screening mammogram in one year. (Code:SM-B-01Y)  BI-RADS CATEGORY  1: Negative.   Electronically Signed   By: Altamese Cabal M.D.   On: 06/08/2016 11:21   RADIOGRAPHIC STUDIES: I have personally reviewed the radiological images as listed and agreed with the findings in the report. No results found.   ASSESSMENT & PLAN:  Carcinoma of overlapping sites of right breast in female, estrogen receptor negative ([place]) #  right breast ipsilateral breast recurrence triple negative [Nov 2016] s/p Lumpectomy; no adjuvant chemotherapy- clinically NED. Mammogram left [dr.sanakr] in nov 2018.   # right post mastectomy neuropathic pain- stable; poor tolerance to Neurontin.  # slight increase in creatinin- 1.2;recommend increased fluid intake..  # follow-up in 6 months  with labs.   CC; Dr.Hedrick, Jeneen Rinks    Orders Placed This Encounter  Procedures  . CBC with Differential/Platelet    Standing Status:   Future    Standing Expiration Date:   01/03/2018  . Comprehensive metabolic panel    Standing Status:   Future    Standing Expiration Date:   01/03/2018  . Cancer antigen 27.29    Standing Status:   Future    Standing Expiration Date:   01/03/2018   All questions were answered. The patient knows to call the clinic with any problems, questions or concerns.      Cammie Sickle, MD 01/03/2017 7:39 PM

## 2017-01-13 DIAGNOSIS — E7800 Pure hypercholesterolemia, unspecified: Secondary | ICD-10-CM | POA: Diagnosis not present

## 2017-01-13 DIAGNOSIS — I495 Sick sinus syndrome: Secondary | ICD-10-CM | POA: Diagnosis not present

## 2017-01-13 DIAGNOSIS — I10 Essential (primary) hypertension: Secondary | ICD-10-CM | POA: Diagnosis not present

## 2017-01-14 DIAGNOSIS — R1013 Epigastric pain: Secondary | ICD-10-CM | POA: Diagnosis not present

## 2017-01-14 DIAGNOSIS — R112 Nausea with vomiting, unspecified: Secondary | ICD-10-CM | POA: Diagnosis not present

## 2017-01-14 DIAGNOSIS — R10816 Epigastric abdominal tenderness: Secondary | ICD-10-CM | POA: Diagnosis not present

## 2017-01-24 DIAGNOSIS — E538 Deficiency of other specified B group vitamins: Secondary | ICD-10-CM | POA: Diagnosis not present

## 2017-02-25 DIAGNOSIS — E538 Deficiency of other specified B group vitamins: Secondary | ICD-10-CM | POA: Diagnosis not present

## 2017-02-28 DIAGNOSIS — H40003 Preglaucoma, unspecified, bilateral: Secondary | ICD-10-CM | POA: Diagnosis not present

## 2017-03-07 DIAGNOSIS — H40053 Ocular hypertension, bilateral: Secondary | ICD-10-CM | POA: Diagnosis not present

## 2017-04-01 DIAGNOSIS — E538 Deficiency of other specified B group vitamins: Secondary | ICD-10-CM | POA: Diagnosis not present

## 2017-04-05 ENCOUNTER — Other Ambulatory Visit: Payer: Self-pay

## 2017-04-05 DIAGNOSIS — Z1231 Encounter for screening mammogram for malignant neoplasm of breast: Secondary | ICD-10-CM

## 2017-05-06 DIAGNOSIS — E538 Deficiency of other specified B group vitamins: Secondary | ICD-10-CM | POA: Diagnosis not present

## 2017-05-06 DIAGNOSIS — Z23 Encounter for immunization: Secondary | ICD-10-CM | POA: Diagnosis not present

## 2017-05-24 DIAGNOSIS — I495 Sick sinus syndrome: Secondary | ICD-10-CM | POA: Diagnosis not present

## 2017-05-24 DIAGNOSIS — E785 Hyperlipidemia, unspecified: Secondary | ICD-10-CM | POA: Diagnosis not present

## 2017-05-24 DIAGNOSIS — I10 Essential (primary) hypertension: Secondary | ICD-10-CM | POA: Diagnosis not present

## 2017-06-10 ENCOUNTER — Ambulatory Visit
Admission: RE | Admit: 2017-06-10 | Discharge: 2017-06-10 | Disposition: A | Discharge status: Home / Self Care | Payer: PPO | Source: Ambulatory Visit | Attending: General Surgery | Admitting: General Surgery

## 2017-06-10 DIAGNOSIS — Z1231 Encounter for screening mammogram for malignant neoplasm of breast: Secondary | ICD-10-CM | POA: Diagnosis not present

## 2017-06-10 DIAGNOSIS — E538 Deficiency of other specified B group vitamins: Secondary | ICD-10-CM | POA: Diagnosis not present

## 2017-06-21 ENCOUNTER — Ambulatory Visit (INDEPENDENT_AMBULATORY_CARE_PROVIDER_SITE_OTHER): Payer: PPO | Admitting: General Surgery

## 2017-06-21 ENCOUNTER — Encounter: Payer: Self-pay | Admitting: General Surgery

## 2017-06-21 VITALS — BP 128/62 | HR 58 | Resp 12 | Ht 62.0 in | Wt 176.0 lb

## 2017-06-21 DIAGNOSIS — C50911 Malignant neoplasm of unspecified site of right female breast: Secondary | ICD-10-CM

## 2017-06-21 NOTE — Patient Instructions (Signed)
  Patient will be asked to return to the office in one year with a left screening mammogram with Dr. Bary Castilla. The patient is aware to call back for any questions or concerns.

## 2017-06-21 NOTE — Progress Notes (Signed)
Patient ID: Julie [name], female   DOB: 06-Aug-1940, 76 y.o.   MRN: [id number]  Chief Complaint  Patient presents with  . Follow-up    HPI Julie [name] is a 76 y.o. female who presents for a breast cancer follow up.The most recent mammogram was done on 06/10/2017.  Patient does perform regular self breast checks and gets regular mammograms done.  She has had some tightness in the right mastectomy site which she says actually seems to be calming down a little    HPI  Past Medical History:  Diagnosis Date  . Anemia   . Arthritis   . Breast cancer ([employer]) 2007   right breast cancer  . Breast cancer ([place]) 2016   recurrent right breast cancer, triple negative  . Cancer [facility]) 2007   Right breast  . Cancer of breast ([place]) 04/30/2015  . GERD (gastroesophageal reflux disease)   . Hypertension   . Personal history of malignant neoplasm of breast   . Shortness of breath dyspnea     Past Surgical History:  Procedure Laterality Date  . ABDOMINAL HYSTERECTOMY    . APPENDECTOMY    . BREAST BIOPSY Right 2007   positive  . BREAST EXCISIONAL BIOPSY Left    neg  . BREAST LUMPECTOMY Right 2007  . CATARACT EXTRACTION, BILATERAL Bilateral   . JOINT REPLACEMENT Left 2011   knee  . JOINT REPLACEMENT Right 12/2011   knee  . KNEE SURGERY Right 03/2012  . MASTECTOMY Left 05/17/2015  . MASTECTOMY MODIFIED RADICAL Right 05/22/2015   Procedure: MASTECTOMY MODIFIED RADICAL;  Surgeon: Christene Lye, MD;  Location: [facility] ORS;  Service: General;  Laterality: Right;    Family History  Problem Relation Age of Onset  . Breast cancer Mother     Social History Social History   Tobacco Use  . Smoking status: Former Smoker    Types: Cigarettes    Last attempt to quit: 05/15/1980    Years since quitting: 37.1  . Smokeless tobacco: Never Used  Substance Use Topics  . Alcohol use: No  . Drug use: No    Allergies  Allergen Reactions  . Gabapentin Other (See Comments)    dizziness     Current Outpatient Medications  Medication Sig Dispense Refill  . amLODipine (NORVASC) 10 MG tablet Take 1 tablet by mouth daily.    Marland Kitchen aspirin EC 81 MG tablet Take 1 tablet by mouth daily.    . cyanocobalamin (,VITAMIN B-12,) 1000 MCG/ML injection Inject 1,000 mcg into the muscle every 30 (thirty) days.    Marland Kitchen KLOR-CON M20 20 MEQ tablet Take 20 mEq by mouth once.     . metoprolol tartrate (LOPRESSOR) 25 MG tablet Take 25 mg by mouth daily.     . simvastatin (ZOCOR) 20 MG tablet Take 20 mg by mouth daily at 6 PM.     . triamterene-hydrochlorothiazide (MAXZIDE-25) 37.5-25 MG per tablet Take 1 tablet by mouth daily.      No current facility-administered medications for this visit.     Review of Systems Review of Systems  Constitutional: Negative.   Cardiovascular: Negative.     Blood pressure 128/62, pulse (!) 58, resp. rate 12, height 5\' 2"  (1.575 m), weight 176 lb (79.8 kg).  Physical Exam Physical Exam  Constitutional: She is oriented to person, place, and time. She appears well-developed and well-nourished.  Eyes: Conjunctivae are normal. No scleral icterus.  Neck: Neck supple.  Cardiovascular: Normal rate, regular rhythm and normal heart sounds.  Pulmonary/Chest: Effort normal and breath sounds normal. Left breast exhibits no inverted nipple, no mass, no nipple discharge, no skin change and no tenderness.  Well-healed right mastectomy incision with no evidence of local recurrence  Abdominal: Soft. Normal appearance and bowel sounds are normal. There is no hepatomegaly. There is no tenderness.  Lymphadenopathy:    She has no cervical adenopathy.    She has no axillary adenopathy.  Neurological: She is alert and oriented to person, place, and time.  Skin: Skin is warm and dry.    Data Reviewed Mammogram and prior notes reviewed   Assessment    History of recurrence of triple negative right breast cancer/2.5 yr status post right mastectomy.    Plan    Patient will be  asked to return to the office in one year with a left screening mammogram with Dr. Bary Castilla. The patient is aware to call back for any questions or concerns.  She is to continue follow-up with oncology   HPI, Physical Exam, Assessment and Plan have been scribed under the direction and in the presence of Mckinley Jewel, MD  Gaspar Cola, CMA  I have completed the exam and reviewed the above documentation for accuracy and completeness.  I agree with the above.  Haematologist has been used and any errors in dictation or transcription are unintentional.  Seeplaputhur G. Jamal Collin, M.D., F.A.C.S.   Junie Panning G 06/21/2017, 11:19 AM

## 2017-06-27 ENCOUNTER — Encounter: Payer: Self-pay | Admitting: Internal Medicine

## 2017-06-27 ENCOUNTER — Other Ambulatory Visit: Payer: Self-pay

## 2017-06-27 ENCOUNTER — Inpatient Hospital Stay: Payer: PPO | Attending: Internal Medicine

## 2017-06-27 ENCOUNTER — Inpatient Hospital Stay (HOSPITAL_BASED_OUTPATIENT_CLINIC_OR_DEPARTMENT_OTHER): Payer: PPO | Admitting: Nurse Practitioner

## 2017-06-27 VITALS — BP 167/67 | HR 71 | Temp 97.5°F | Resp 20 | Ht 62.0 in | Wt 176.2 lb

## 2017-06-27 DIAGNOSIS — Z853 Personal history of malignant neoplasm of breast: Secondary | ICD-10-CM | POA: Insufficient documentation

## 2017-06-27 DIAGNOSIS — M129 Arthropathy, unspecified: Secondary | ICD-10-CM | POA: Insufficient documentation

## 2017-06-27 DIAGNOSIS — K219 Gastro-esophageal reflux disease without esophagitis: Secondary | ICD-10-CM

## 2017-06-27 DIAGNOSIS — D649 Anemia, unspecified: Secondary | ICD-10-CM | POA: Insufficient documentation

## 2017-06-27 DIAGNOSIS — Z9013 Acquired absence of bilateral breasts and nipples: Secondary | ICD-10-CM | POA: Insufficient documentation

## 2017-06-27 DIAGNOSIS — Z7982 Long term (current) use of aspirin: Secondary | ICD-10-CM

## 2017-06-27 DIAGNOSIS — R0602 Shortness of breath: Secondary | ICD-10-CM

## 2017-06-27 DIAGNOSIS — Z923 Personal history of irradiation: Secondary | ICD-10-CM

## 2017-06-27 DIAGNOSIS — E876 Hypokalemia: Secondary | ICD-10-CM | POA: Insufficient documentation

## 2017-06-27 DIAGNOSIS — I10 Essential (primary) hypertension: Secondary | ICD-10-CM | POA: Insufficient documentation

## 2017-06-27 DIAGNOSIS — Z87891 Personal history of nicotine dependence: Secondary | ICD-10-CM

## 2017-06-27 DIAGNOSIS — Z79899 Other long term (current) drug therapy: Secondary | ICD-10-CM | POA: Insufficient documentation

## 2017-06-27 DIAGNOSIS — C50811 Malignant neoplasm of overlapping sites of right female breast: Secondary | ICD-10-CM

## 2017-06-27 DIAGNOSIS — Z171 Estrogen receptor negative status [ER-]: Secondary | ICD-10-CM

## 2017-06-27 LAB — CBC WITH DIFFERENTIAL/PLATELET
Basophils Absolute: 0 10*3/uL (ref 0–0.1)
Basophils Relative: 1 %
EOS PCT: 5 %
Eosinophils Absolute: 0.2 10*3/uL (ref 0–0.7)
HCT: 39.9 % (ref 35.0–47.0)
Hemoglobin: 13.1 g/dL (ref 12.0–16.0)
LYMPHS ABS: 1 10*3/uL (ref 1.0–3.6)
LYMPHS PCT: 24 %
MCH: 27.5 pg (ref 26.0–34.0)
MCHC: 32.8 g/dL (ref 32.0–36.0)
MCV: 83.8 fL (ref 80.0–100.0)
MONO ABS: 0.3 10*3/uL (ref 0.2–0.9)
Monocytes Relative: 9 %
Neutro Abs: 2.4 10*3/uL (ref 1.4–6.5)
Neutrophils Relative %: 61 %
PLATELETS: 182 10*3/uL (ref 150–440)
RBC: 4.76 MIL/uL (ref 3.80–5.20)
RDW: 15.4 % — AB (ref 11.5–14.5)
WBC: 4 10*3/uL (ref 3.6–11.0)

## 2017-06-27 LAB — COMPREHENSIVE METABOLIC PANEL
ALT: 15 U/L (ref 14–54)
AST: 19 U/L (ref 15–41)
Albumin: 4.3 g/dL (ref 3.5–5.0)
Alkaline Phosphatase: 92 U/L (ref 38–126)
Anion gap: 8 (ref 5–15)
BUN: 18 mg/dL (ref 6–20)
CHLORIDE: 102 mmol/L (ref 101–111)
CO2: 27 mmol/L (ref 22–32)
CREATININE: 0.65 mg/dL (ref 0.44–1.00)
Calcium: 9.4 mg/dL (ref 8.9–10.3)
GFR calc Af Amer: >60 mL/min (ref >60)
Glucose, Bld: 133 mg/dL — ABNORMAL HIGH (ref 65–99)
POTASSIUM: 3.1 mmol/L — AB (ref 3.5–5.1)
Sodium: 137 mmol/L (ref 135–145)
Total Bilirubin: 0.6 mg/dL (ref 0.3–1.2)
Total Protein: 7.3 g/dL (ref 6.5–8.1)

## 2017-06-27 NOTE — Assessment & Plan Note (Addendum)
#   right breast ipsilateral breast recurrence triple negative [Nov 2016] s/p Lumpectomy; no adjuvant chemotherapy. Mammogram [dr.sanakr] in nov 2018 NED. CA 27.29 pending.   # right post mastectomy neuropathic pain- improving. Continue prn Aleve.   # Creatinine improved from previous visit.  0.65 today.  Continue monitoring.  # Hypokalemia-continue oral potassium 40 mEq daily.  Encouraged increased intake of potassium rich foods.  # follow-up in 6 months with labs.   CC: Dr.Hedrick, Jeneen Rinks

## 2017-06-27 NOTE — Progress Notes (Signed)
[place] OFFICE PROGRESS NOTE  Patient Care Team: [place] Pink, [name], MD (General Surgery)  Cancer Staging No matching staging information was found for the patient.    Oncology History   1. Carcinoma of the breast, status post lumpectomy and sentinel lymph node biopsy. June 2007ER/PR negative, Her-2 negative. PT2N1(mic)M0, STAGE IIb 2. Completed 6 cycles Cytoxan/Taxotere October 2007. Right breast XRT. 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 4. Right mastectomy, May 22, 2015. Location of mass was at 9:00. All the margins were clear. 4 lymph nodes were negative for any tumor. Invasive mammary carcinoma with apocrine features. Margins are negative. Informal vascular invasion present, rpT2 pNO cMO (triple negative).     Carcinoma of overlapping sites of right breast in female, estrogen receptor negative ([place])   04/05/2016 Initial Diagnosis    Cancer of overlapping sites of right female breast [facility])          INTERVAL HISTORY:  Julie [name] 76 y.o.  female pleasant patient above history of Breast cancer is here for follow-up.  Patient previously had mild pain in right breast at site of mastectomy. She did not tolerate neurontin (dizziness) and has used Aleve for pain which resolved pain.  Denies any lumps or bumps.  Denies hot flashes.  Denies chest pain or shortness of breath.  Denies cough, nausea, vomiting.  No headaches or bone pain.  Last seen by Dr. Jamal Collin 06/21/17.  Most recent mammogram 06/10/17.  Patient performs self breast exams regularly.  Says that overall she feels well.    REVIEW OF SYSTEMS:  A complete 10 point review of system is done which is negative except mentioned above/history of present illness.   PAST MEDICAL HISTORY :  Past Medical History:  Diagnosis Date   . Anemia   . Arthritis   . Breast cancer ([place]) 2007   right breast cancer  . Breast cancer ([place]) 2016   recurrent right breast cancer, triple negative  . Cancer [facility]) 2007   Right breast  . Cancer of breast ([place]) 04/30/2015  . GERD (gastroesophageal reflux disease)   . Hypertension   . Personal history of malignant neoplasm of breast   . Shortness of breath dyspnea     PAST SURGICAL HISTORY :   Past Surgical History:  Procedure Laterality Date  . ABDOMINAL HYSTERECTOMY    . APPENDECTOMY    . BREAST BIOPSY Right 2007   positive  . BREAST EXCISIONAL BIOPSY Left    neg  . BREAST LUMPECTOMY Right 2007  . CATARACT EXTRACTION, BILATERAL Bilateral   . JOINT REPLACEMENT Left 2011   knee  . JOINT REPLACEMENT Right 12/2011   knee  . KNEE SURGERY Right 03/2012  . MASTECTOMY Left 05/17/2015  . MASTECTOMY MODIFIED RADICAL Right 05/22/2015   Procedure: MASTECTOMY MODIFIED RADICAL;  Surgeon: Christene Lye, MD;  Location: [facility] ORS;  Service: General;  Laterality: Right;    FAMILY HISTORY :   Family History  Problem Relation Age of Onset  . Breast cancer Mother     SOCIAL HISTORY:   Social History   Tobacco Use  . Smoking status: Former Smoker    Types: Cigarettes    Last attempt to quit: 05/15/1980    Years since quitting: 37.1  . Smokeless tobacco: Never Used  Substance Use Topics  . Alcohol  use: No  . Drug use: No    ALLERGIES:  is allergic to gabapentin.  MEDICATIONS:  Current Outpatient Medications  Medication Sig Dispense Refill  . amLODipine (NORVASC) 10 MG tablet Take 1 tablet by mouth daily.    Marland Kitchen aspirin EC 81 MG tablet Take 1 tablet by mouth daily.    . cyanocobalamin (,VITAMIN B-12,) 1000 MCG/ML injection Inject 1,000 mcg into the muscle every 30 (thirty) days.    Marland Kitchen KLOR-CON M20 20 MEQ tablet Take 40 mEq by mouth once.    . simvastatin (ZOCOR) 20 MG tablet Take 20 mg by mouth daily at 6 PM.     . triamterene-hydrochlorothiazide (MAXZIDE-25) 37.5-25  MG per tablet Take 1 tablet by mouth daily.      No current facility-administered medications for this visit.     PHYSICAL EXAMINATION: ECOG PERFORMANCE STATUS: 0 - Asymptomatic  BP (!) 167/67 (BP Location: Left Arm, Patient Position: Sitting)   Pulse 71   Temp (!) 97.5 F (36.4 C) (Tympanic)   Resp 20   Ht _0  (1.575 m)   Wt 176 lb 3.2 oz (79.9 kg)   BMI 32.23 kg/m   Filed Weights   06/27/17 1149  Weight: 176 lb 3.2 oz (79.9 kg)    GENERAL: Well-nourished well-developed; Alert, no distress and comfortable. Alone.  EYES: no pallor or icterus OROPHARYNX: no thrush or ulceration; good dentition  NECK: supple, no masses felt LYMPH:  no palpable lymphadenopathy in the cervical, axillary or inguinal regions LUNGS: clear to auscultation and  No wheeze or crackles HEART/CVS: regular rate & rhythm and no murmurs; No lower extremity edema ABDOMEN:abdomen soft, non-tender and normal bowel sounds Musculoskeletal:no cyanosis of digits and no clubbing  PSYCH: alert & oriented x 3 with fluent speech NEURO: no focal motor/sensory deficits SKIN:  no rashes or significant lesions Breast: Exam deferred   LABORATORY DATA:  I have reviewed the data as listed    Component Value Date/Time   NA 137 06/27/2017 1105   NA 138 04/25/2015 1210   NA 141 03/15/2012 1210   K 3.1 (L) 06/27/2017 1105   K 3.2 (L) 03/21/2012 1256   CL 102 06/27/2017 1105   CL 102 03/15/2012 1210   CO2 27 06/27/2017 1105   CO2 33 (H) 03/15/2012 1210   GLUCOSE 133 (H) 06/27/2017 1105   GLUCOSE 113 (H) 03/15/2012 1210   BUN 18 06/27/2017 1105   BUN 15 04/25/2015 1210   BUN 10 03/15/2012 1210   CREATININE 0.65 06/27/2017 1105   CREATININE 0.75 03/15/2012 1210   CALCIUM 9.4 06/27/2017 1105   CALCIUM 9.9 03/15/2012 1210   PROT 7.3 06/27/2017 1105   PROT 7.1 04/25/2015 1210   ALBUMIN 4.3 06/27/2017 1105   ALBUMIN 4.7 04/25/2015 1210   AST 19 06/27/2017 1105   ALT 15 06/27/2017 1105   ALKPHOS 92 06/27/2017  1105   BILITOT 0.6 06/27/2017 1105   BILITOT 0.3 04/25/2015 1210   GFRNONAA >60 06/27/2017 1105   GFRNONAA >60 03/15/2012 1210   GFRAA >60 06/27/2017 1105   GFRAA >60 03/15/2012 1210    No results found for: SPEP, UPEP  Lab Results  Component Value Date   WBC 4.0 06/27/2017   NEUTROABS 2.4 06/27/2017   HGB 13.1 06/27/2017   HCT 39.9 06/27/2017   MCV 83.8 06/27/2017   PLT 182 06/27/2017      Chemistry      Component Value Date/Time   NA 137 06/27/2017 1105   NA 138 04/25/2015  1210   NA 141 03/15/2012 1210   K 3.1 (L) 06/27/2017 1105   K 3.2 (L) 03/21/2012 1256   CL 102 06/27/2017 1105   CL 102 03/15/2012 1210   CO2 27 06/27/2017 1105   CO2 33 (H) 03/15/2012 1210   BUN 18 06/27/2017 1105   BUN 15 04/25/2015 1210   BUN 10 03/15/2012 1210   CREATININE 0.65 06/27/2017 1105   CREATININE 0.75 03/15/2012 1210      Component Value Date/Time   CALCIUM 9.4 06/27/2017 1105   CALCIUM 9.9 03/15/2012 1210   ALKPHOS 92 06/27/2017 1105   AST 19 06/27/2017 1105   ALT 15 06/27/2017 1105   BILITOT 0.6 06/27/2017 1105   BILITOT 0.3 04/25/2015 1210     IMPRESSION: No mammographic evidence of malignancy. A result letter of this screening mammogram will be mailed directly to the patient.  RECOMMENDATION: Screening mammogram in one year.  (Code:SM-L-61M)  BI-RADS CATEGORY  1: Negative.   Electronically Signed   By: Ammie Ferrier M.D.   On: 06/10/2017 09:49   RADIOGRAPHIC STUDIES: I have personally reviewed the radiological images as listed and agreed with the findings in the report. No results found.   ASSESSMENT & PLAN:  Carcinoma of overlapping sites of right breast in female, estrogen receptor negative ([place]) # right breast ipsilateral breast recurrence triple negative [Nov 2016] s/p Lumpectomy; no adjuvant chemotherapy. Mammogram [dr.[email or web address]] in nov 2018 NED. CA 27.29 pending.   # right post mastectomy neuropathic pain- improving. Continue prn Aleve.   #  Creatinine improved from previous visit.  0.65 today.  Continue monitoring.  # Hypokalemia-continue oral potassium 40 mEq daily.  Encouraged increased intake of potassium rich foods.  # follow-up in 6 months with labs.   CC: Dr.Hedrick, Jeneen Rinks     Orders Placed This Encounter  Procedures  . CBC with Differential    Standing Status:   Future    Standing Expiration Date:   06/27/2018  . Comprehensive metabolic panel    Standing Status:   Future    Standing Expiration Date:   06/27/2018  . Cancer antigen 27.29    Standing Status:   Future    Standing Expiration Date:   06/27/2018   All questions were answered. The patient knows to call the clinic with any problems, questions or concerns.      Verlon Au, NP 06/27/2017 2:22 PM

## 2017-06-27 NOTE — Progress Notes (Signed)
Patient here for follow-up breast cancer (right). Patient had a breast exam performed by Dr.Sankar last week. She reports improvement in neuropathic pains in right mastectomy site. She no longer requires any aleve for pain to manage this neuropathic pain.Julie [name]

## 2017-06-28 LAB — CANCER ANTIGEN 27.29: CA 27.29: 12 U/mL (ref 0.0–38.6)

## 2017-07-15 DIAGNOSIS — E538 Deficiency of other specified B group vitamins: Secondary | ICD-10-CM | POA: Diagnosis not present

## 2017-12-26 ENCOUNTER — Other Ambulatory Visit: Payer: Self-pay

## 2017-12-26 ENCOUNTER — Inpatient Hospital Stay: Payer: Medicare HMO | Attending: Nurse Practitioner | Admitting: Nurse Practitioner

## 2017-12-26 ENCOUNTER — Encounter: Payer: Self-pay | Admitting: Nurse Practitioner

## 2017-12-26 ENCOUNTER — Inpatient Hospital Stay: Payer: Medicare HMO

## 2017-12-26 VITALS — BP 161/81 | HR 72 | Temp 97.8°F | Resp 16 | Ht 62.0 in | Wt 180.2 lb

## 2017-12-26 DIAGNOSIS — C50811 Malignant neoplasm of overlapping sites of right female breast: Secondary | ICD-10-CM | POA: Diagnosis not present

## 2017-12-26 DIAGNOSIS — E876 Hypokalemia: Secondary | ICD-10-CM | POA: Insufficient documentation

## 2017-12-26 DIAGNOSIS — Z87891 Personal history of nicotine dependence: Secondary | ICD-10-CM | POA: Insufficient documentation

## 2017-12-26 DIAGNOSIS — R52 Pain, unspecified: Secondary | ICD-10-CM | POA: Insufficient documentation

## 2017-12-26 DIAGNOSIS — Z171 Estrogen receptor negative status [ER-]: Secondary | ICD-10-CM

## 2017-12-26 LAB — CBC WITH DIFFERENTIAL/PLATELET
Basophils Absolute: 0 10*3/uL (ref 0–0.1)
Basophils Relative: 1 %
EOS ABS: 0.2 10*3/uL (ref 0–0.7)
EOS PCT: 5 %
HCT: 38 % (ref 35.0–47.0)
Hemoglobin: 12.8 g/dL (ref 12.0–16.0)
LYMPHS ABS: 1.5 10*3/uL (ref 1.0–3.6)
Lymphocytes Relative: 33 %
MCH: 27.9 pg (ref 26.0–34.0)
MCHC: 33.7 g/dL (ref 32.0–36.0)
MCV: 82.6 fL (ref 80.0–100.0)
MONO ABS: 0.6 10*3/uL (ref 0.2–0.9)
Monocytes Relative: 12 %
Neutro Abs: 2.3 10*3/uL (ref 1.4–6.5)
Neutrophils Relative %: 49 %
PLATELETS: 189 10*3/uL (ref 150–440)
RBC: 4.6 MIL/uL (ref 3.80–5.20)
RDW: 15.5 % — AB (ref 11.5–14.5)
WBC: 4.6 10*3/uL (ref 3.6–11.0)

## 2017-12-26 LAB — COMPREHENSIVE METABOLIC PANEL
ALT: 16 U/L (ref 14–54)
ANION GAP: 9 (ref 5–15)
AST: 20 U/L (ref 15–41)
Albumin: 4.2 g/dL (ref 3.5–5.0)
Alkaline Phosphatase: 98 U/L (ref 38–126)
BUN: 14 mg/dL (ref 6–20)
CHLORIDE: 105 mmol/L (ref 101–111)
CO2: 26 mmol/L (ref 22–32)
Calcium: 9.3 mg/dL (ref 8.9–10.3)
Creatinine, Ser: 0.94 mg/dL (ref 0.44–1.00)
GFR calc non Af Amer: 57 mL/min — ABNORMAL LOW (ref >60)
Glucose, Bld: 115 mg/dL — ABNORMAL HIGH (ref 65–99)
POTASSIUM: 3.4 mmol/L — AB (ref 3.5–5.1)
SODIUM: 140 mmol/L (ref 135–145)
Total Bilirubin: 0.6 mg/dL (ref 0.3–1.2)
Total Protein: 7.1 g/dL (ref 6.5–8.1)

## 2017-12-26 NOTE — Progress Notes (Signed)
Patient here for follow up she reports no changes since last appointment.

## 2017-12-26 NOTE — Progress Notes (Signed)
[place] OFFICE PROGRESS NOTE  Patient Care Team: [place] Pink, [name], [name], [name]  Cancer Staging No matching staging information was found for the patient.   Oncology History   1. Carcinoma of the breast, status post lumpectomy and sentinel lymph node biopsy. June 2007ER/PR negative, Her-2 negative. PT2N1(mic)M0, STAGE IIb 2. Completed 6 cycles Cytoxan/Taxotere October 2007. Right breast XRT. 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 4. Right mastectomy, May 22, 2015. Location of mass was at 9:00. All the margins were clear. 4 lymph nodes were negative for any tumor. Invasive mammary carcinoma with apocrine features. Margins are negative. Informal vascular invasion present, rpT2 pNO cMO (triple negative).     Carcinoma of overlapping sites of right breast in female, estrogen receptor negative ([place])   04/05/2016 Initial Diagnosis    Cancer of overlapping sites of right female breast [facility])        INTERVAL HISTORY:  Julie [name] 77 y.o.  female pleasant patient above history of Breast cancer returns to clinic today for follow up.   Patient was last seen in clinic on 06/27/17 with negative exam. She was found to have decreased serum potassium and was encouraged to increase intake of potassium rich foods and continue supplementation of 40 meq daily. Last seen by Dr. Jamal Collin 06/21/17.  Most recent mammogram 06/10/17.   Today, she reports feeling well. She denies any new lumps or bumps. She performs self breast exams regularly and reports that overall she feels well. She is continuing her potassium supplementation. She denies hot flashes, chest pain or shortness of breath. She denies cough, nausea, vomiting, headaches,  and/or bone pain. Her appetite is stable and good. She denies significant fatigue, night sweats, or unexplained weight loss.    REVIEW OF SYSTEMS:  A complete 10 point review of system is done which is negative except mentioned above/history of present illness.   PAST MEDICAL HISTORY :  Past Medical History:  Diagnosis Date  . Anemia   . Arthritis   . Breast cancer ([place]) 2007   right breast cancer  . Breast cancer ([place]) 2016   recurrent right breast cancer, triple negative  . Cancer [facility]) 2007   Right breast  . Cancer of breast ([place]) 04/30/2015  . GERD (gastroesophageal reflux disease)   . Hypertension   . Personal history of malignant neoplasm of breast   . Shortness of breath dyspnea     PAST SURGICAL HISTORY :   Past Surgical History:  Procedure Laterality Date  . ABDOMINAL HYSTERECTOMY    . APPENDECTOMY    . BREAST BIOPSY Right 2007   positive  . BREAST EXCISIONAL BIOPSY Left    neg  . BREAST LUMPECTOMY Right 2007  . CATARACT EXTRACTION, BILATERAL Bilateral   . JOINT REPLACEMENT Left 2011   knee  . JOINT REPLACEMENT Right 12/2011   knee  . KNEE SURGERY Right 03/2012  . MASTECTOMY Left 05/17/2015  . MASTECTOMY MODIFIED RADICAL Right 05/22/2015   Procedure: MASTECTOMY MODIFIED RADICAL;  Surgeon: Christene Lye, MD;  Location: [facility] ORS;  Service: General;  Laterality: Right;    FAMILY HISTORY :   Family History  Problem Relation Age of Onset  . Breast cancer Mother     SOCIAL HISTORY:   Social History  Tobacco Use  . Smoking status: Former Smoker    Types: Cigarettes    Last attempt to quit: 05/15/1980    Years since quitting: 37.6  . Smokeless tobacco: Never Used  Substance Use Topics  . Alcohol use: No  . Drug use: No    ALLERGIES:  is allergic to gabapentin.  MEDICATIONS:  Current Outpatient Medications  Medication Sig Dispense Refill  . amLODipine (NORVASC) 10 MG tablet Take 1 tablet by mouth daily.    Marland Kitchen aspirin EC 81 MG tablet Take 1  tablet by mouth daily.    . cyanocobalamin (,VITAMIN B-12,) 1000 MCG/ML injection Inject 1,000 mcg into the muscle every 30 (thirty) days.    Marland Kitchen KLOR-CON M20 20 MEQ tablet Take 40 mEq by mouth once.    . simvastatin (ZOCOR) 20 MG tablet Take 20 mg by mouth daily at 6 PM.     . triamterene-hydrochlorothiazide (MAXZIDE-25) 37.5-25 MG per tablet Take 1 tablet by mouth daily.      No current facility-administered medications for this visit.     PHYSICAL EXAMINATION: ECOG PERFORMANCE STATUS: 0 - Asymptomatic  BP (!) 161/81 (BP Location: Left Arm, Patient Position: Sitting)   Pulse 72   Temp 97.8 F (36.6 C)   Resp 16   Ht 5' 2"  (1.575 m)   Wt 180 lb 3.2 oz (81.7 kg)   BMI 32.96 kg/m   Filed Weights   12/26/17 1357  Weight: 180 lb 3.2 oz (81.7 kg)    GENERAL: Well-nourished well-developed; Alert, no distress and comfortable. Alone.   EYES: no pallor or icterus OROPHARYNX: no thrush or ulceration NECK: supple; no lymph nodes felt LYMPH: no palpable lymphadenopathy in the axillary or inguinal regions LUNGS: Decreased breath sounds auscultation bilaterally. No wheeze or crackles HEART/CVS: regular rate & rhythm and no murmurs; No lower extremity edema ABDOMEN: abdomen soft, non-tender and normal bowel sounds. No hepatomegaly or splenomegaly.  Musculoskeletal: no cyanosis of digits and no clubbing  PSYCH: alert & oriented x 3 with fluent speech NEURO: no focal motor/sensory deficits SKIN: no rashes or significant lesions BREAST: exam deferred per patient request   LABORATORY DATA:  I have reviewed the data as listed    Component Value Date/Time   NA 140 12/26/2017 1342   NA 138 04/25/2015 1210   NA 141 03/15/2012 1210   K 3.4 (L) 12/26/2017 1342   K 3.2 (L) 03/21/2012 1256   CL 105 12/26/2017 1342   CL 102 03/15/2012 1210   CO2 26 12/26/2017 1342   CO2 33 (H) 03/15/2012 1210   GLUCOSE 115 (H) 12/26/2017 1342   GLUCOSE 113 (H) 03/15/2012 1210   BUN 14 12/26/2017 1342    BUN 15 04/25/2015 1210   BUN 10 03/15/2012 1210   CREATININE 0.94 12/26/2017 1342   CREATININE 0.75 03/15/2012 1210   CALCIUM 9.3 12/26/2017 1342   CALCIUM 9.9 03/15/2012 1210   PROT 7.1 12/26/2017 1342   PROT 7.1 04/25/2015 1210   ALBUMIN 4.2 12/26/2017 1342   ALBUMIN 4.7 04/25/2015 1210   AST 20 12/26/2017 1342   ALT 16 12/26/2017 1342   ALKPHOS 98 12/26/2017 1342   BILITOT 0.6 12/26/2017 1342   BILITOT 0.3 04/25/2015 1210   GFRNONAA 57 (L) 12/26/2017 1342   GFRNONAA >60 03/15/2012 1210   GFRAA >60 12/26/2017 1342   GFRAA >60 03/15/2012 1210    No results found for: SPEP, UPEP  Lab Results  Component Value Date   WBC 4.6 12/26/2017   NEUTROABS 2.3  12/26/2017   HGB 12.8 12/26/2017   HCT 38.0 12/26/2017   MCV 82.6 12/26/2017   PLT 189 12/26/2017      Chemistry      Component Value Date/Time   NA 140 12/26/2017 1342   NA 138 04/25/2015 1210   NA 141 03/15/2012 1210   K 3.4 (L) 12/26/2017 1342   K 3.2 (L) 03/21/2012 1256   CL 105 12/26/2017 1342   CL 102 03/15/2012 1210   CO2 26 12/26/2017 1342   CO2 33 (H) 03/15/2012 1210   BUN 14 12/26/2017 1342   BUN 15 04/25/2015 1210   BUN 10 03/15/2012 1210   CREATININE 0.94 12/26/2017 1342   CREATININE 0.75 03/15/2012 1210      Component Value Date/Time   CALCIUM 9.3 12/26/2017 1342   CALCIUM 9.9 03/15/2012 1210   ALKPHOS 98 12/26/2017 1342   AST 20 12/26/2017 1342   ALT 16 12/26/2017 1342   BILITOT 0.6 12/26/2017 1342   BILITOT 0.3 04/25/2015 1210     IMPRESSION: No mammographic evidence of malignancy. A result letter of this screening mammogram will be mailed directly to the patient.  RECOMMENDATION: Screening mammogram in one year.  (Code:SM-L-43M)  BI-RADS CATEGORY  1: Negative.   Electronically Signed   By: Ammie Ferrier M.D.   On: 06/10/2017 09:49   RADIOGRAPHIC STUDIES: I have personally reviewed the radiological images as listed and agreed with the findings in the report. No results  found.   ASSESSMENT & PLAN:   1. Right breast ipsilateral breast recurrence- triple negative (nov 2016), s/p lumpectomy; no adjuvant chemotherapy. Mammogram (Dr. Jamal Collin) in nov 2018. NED. CA 27.29 12.0 (06/27/17), results pending today. Follow up as scheduled with Dr. Bary Castilla (Dr. Jamal Collin has retired).   2. Neuropathic pain- r/t mastectomy- failed gabapentin. Continue Aleve prn.   3. Hypokalemia- K 3.4 today. Improved and stable. Continue oral potassium 40 mEq daily and encouraged increased intake of potassium rich foods.   rtc in 6 months for labs and re-evaluation with Dr. Rogue Bussing   Orders Placed This Encounter  Procedures  . CBC with Differential/Platelet    Standing Status:   Future    Standing Expiration Date:   12/27/2018  . Comprehensive metabolic panel    Standing Status:   Future    Standing Expiration Date:   12/27/2018  . Cancer antigen 27.29    Standing Status:   Future    Standing Expiration Date:   12/27/2018   All questions were answered. The patient knows to call the clinic with any problems, questions or concerns.      Beckey Rutter, DNP, AGNP-C [place] [facility] [phone] (work cell) [phone] (office) 12/26/17 3:01 PM

## 2017-12-27 ENCOUNTER — Telehealth: Payer: Self-pay | Admitting: *Deleted

## 2017-12-27 LAB — CANCER ANTIGEN 27.29: CA 27.29: 9.9 U/mL (ref 0.0–38.6)

## 2017-12-27 NOTE — Telephone Encounter (Signed)
Informed patient of her normal CA 27.29 lab results, per Beckey Rutter, NP's request. Patient verbalized understanding.     dhs

## 2018-01-20 ENCOUNTER — Encounter: Payer: Medicare HMO | Attending: Family Medicine | Admitting: *Deleted

## 2018-01-20 ENCOUNTER — Encounter: Payer: Self-pay | Admitting: *Deleted

## 2018-01-20 VITALS — BP 132/70 | Ht 62.0 in | Wt 180.5 lb

## 2018-01-20 DIAGNOSIS — E119 Type 2 diabetes mellitus without complications: Secondary | ICD-10-CM | POA: Insufficient documentation

## 2018-01-20 DIAGNOSIS — Z713 Dietary counseling and surveillance: Secondary | ICD-10-CM | POA: Diagnosis not present

## 2018-01-20 NOTE — Progress Notes (Signed)
Diabetes Self-Management Education  Visit Type: First/Initial  Appt. Start Time: 0845 Appt. End Time: 0935  01/20/2018  [name], identified by name and date of birth, is a 77 y.o. female with a diagnosis of Diabetes: Type 2.   ASSESSMENT  Blood pressure 132/70, height 5\' 2"  (1.575 m), weight 180 lb 8 oz (81.9 kg). Body mass index is 33.01 kg/m.  Diabetes Self-Management Education - 01/20/18 0943      Visit Information   Visit Type  First/Initial      Initial Visit   Diabetes Type  Type 2    Are you currently following a meal plan?  No    Are you taking your medications as prescribed?  Yes    Date Diagnosed  per A1C - pt had diabetes in May 2017      Health Coping   How would you rate your overall health?  Good      Psychosocial Assessment   Patient Belief/Attitude about Diabetes  Other (comment) "Dr Julie [name] never told me I had diabetes"    Self-care barriers  None    Self-management support  Family    Patient Concerns  Nutrition/Meal planning;Weight Control;Glycemic Control;Medication;Monitoring    Special Needs  None    Preferred Learning Style  Visual    Learning Readiness  Ready    How often do you need to have someone help you when you read instructions, pamphlets, or other written materials from your doctor or pharmacy?  1 - Never    What is the last grade level you completed in school?  High school      Pre-Education Assessment   Patient understands the diabetes disease and treatment process.  Needs Instruction    Patient understands incorporating nutritional management into lifestyle.  Needs Instruction    Patient undertands incorporating physical activity into lifestyle.  Needs Instruction    Patient understands using medications safely.  Needs Instruction    Patient understands monitoring blood glucose, interpreting and using results  Needs Instruction    Patient understands prevention, detection, and treatment of acute complications.  Needs  Instruction    Patient understands prevention, detection, and treatment of chronic complications.  Needs Instruction    Patient understands how to develop strategies to address psychosocial issues.  Needs Instruction    Patient understands how to develop strategies to promote health/change behavior.  Needs Instruction      Complications   Last HgB A1C per patient/outside source  6.8 % 11/24/17    How often do you check your blood sugar?  Not recommended by provider    Have you had a dilated eye exam in the past 12 months?  Yes    Have you had a dental exam in the past 12 months?  No    Are you checking your feet?  Yes    How many days per week are you checking your feet?  7      Dietary Intake   Breakfast  oatmeal, toast    Lunch  skips    Snack (afternoon)  fruit    Dinner  chicken, beef, fish, occasional pork with pasta, potatoes, pinto beans, green beans, broccoli, cabbage, corn, greens onions, tomatoes, cuccumbers    Snack (evening)  popcorn    Beverage(s)  sugar sweetened tea, water, coffee      Exercise   Exercise Type  Light (walking / raking leaves)    How many days per week to you exercise?  5  How many minutes per day do you exercise?  30    Total minutes per week of exercise  150      Patient Education   Previous Diabetes Education  No    Disease state   Definition of diabetes, type 1 and 2, and the diagnosis of diabetes    Nutrition management   Role of diet in the treatment of diabetes and the relationship between the three main macronutrients and blood glucose level    Physical activity and exercise   Role of exercise on diabetes management, blood pressure control and cardiac health.    Monitoring  Identified appropriate SMBG and/or A1C goals.    Chronic complications  Relationship between chronic complications and blood glucose control    Psychosocial adjustment  Role of stress on diabetes;Identified and addressed patients feelings and concerns about diabetes       Individualized Goals (developed by patient)   Reducing Risk  Improve blood sugars Decrease medications Prevent diabetes complications Lose weight     Outcomes   Expected Outcomes  Demonstrated interest in learning. Expect positive outcomes    Future DMSE  PRN    Program Status  Not Completed       Individualized Plan for Diabetes Self-Management Training:   Learning Objective:  Patient will have a greater understanding of diabetes self-management. Patient education plan is to attend individual and/or group sessions per assessed needs and concerns.   Plan:   Patient Instructions  Exercise: Continue walking for  30  minutes   5 days a week Eat 3 meals day,  1 snack a day Space meals 4-6 hours apart Don't skip meals Avoid sugar sweetened drinks (tea) Call back if you want to schedule diabetes classes or an appointment with the dietitian  Expected Outcomes:  Demonstrated interest in learning. Expect positive outcomes  Education material provided:  General Meal Planning Guidelines Simple Meal Plan  If problems or questions, patient to contact team via:  Johny Drilling, RN, CCM, CDE [phone]  Future DSME appointment: PRN  Patient will call back if she wants to schedule diabetes classes or an appointment with the dietitian

## 2018-01-20 NOTE — Patient Instructions (Signed)
Exercise: Continue walking for  30  minutes   5 days a week  Eat 3 meals day,  1 snack a day Space meals 4-6 hours apart Don't skip meals Avoid sugar sweetened drinks (tea)  Call back if you want to schedule diabetes classes or an appointment with the dietitian

## 2018-03-28 DIAGNOSIS — R0602 Shortness of breath: Secondary | ICD-10-CM | POA: Insufficient documentation

## 2018-03-28 DIAGNOSIS — R600 Localized edema: Secondary | ICD-10-CM | POA: Insufficient documentation

## 2018-04-07 ENCOUNTER — Other Ambulatory Visit: Payer: Self-pay

## 2018-04-07 DIAGNOSIS — Z1231 Encounter for screening mammogram for malignant neoplasm of breast: Secondary | ICD-10-CM

## 2018-06-12 ENCOUNTER — Other Ambulatory Visit: Payer: Self-pay | Admitting: General Surgery

## 2018-06-12 ENCOUNTER — Ambulatory Visit
Admission: RE | Admit: 2018-06-12 | Discharge: 2018-06-12 | Disposition: A | Discharge status: Home / Self Care | Payer: Medicare HMO | Source: Ambulatory Visit | Attending: General Surgery | Admitting: General Surgery

## 2018-06-12 DIAGNOSIS — R921 Mammographic calcification found on diagnostic imaging of breast: Secondary | ICD-10-CM

## 2018-06-12 DIAGNOSIS — Z1231 Encounter for screening mammogram for malignant neoplasm of breast: Secondary | ICD-10-CM | POA: Insufficient documentation

## 2018-06-12 DIAGNOSIS — R928 Other abnormal and inconclusive findings on diagnostic imaging of breast: Secondary | ICD-10-CM

## 2018-06-14 ENCOUNTER — Ambulatory Visit: Payer: Medicare HMO | Admitting: Anesthesiology

## 2018-06-14 ENCOUNTER — Encounter
Admission: RE | Disposition: A | Discharge status: Home / Self Care | Payer: Self-pay | Source: Ambulatory Visit | Attending: Internal Medicine

## 2018-06-14 ENCOUNTER — Encounter: Payer: Self-pay | Admitting: *Deleted

## 2018-06-14 ENCOUNTER — Ambulatory Visit
Admission: RE | Admit: 2018-06-14 | Discharge: 2018-06-14 | Disposition: A | Discharge status: Home / Self Care | Payer: Medicare HMO | Source: Ambulatory Visit | Attending: Internal Medicine | Admitting: Internal Medicine

## 2018-06-14 DIAGNOSIS — K5520 Angiodysplasia of colon without hemorrhage: Secondary | ICD-10-CM | POA: Insufficient documentation

## 2018-06-14 DIAGNOSIS — E119 Type 2 diabetes mellitus without complications: Secondary | ICD-10-CM | POA: Insufficient documentation

## 2018-06-14 DIAGNOSIS — Z86010 Personal history of colonic polyps: Secondary | ICD-10-CM | POA: Insufficient documentation

## 2018-06-14 DIAGNOSIS — Z1211 Encounter for screening for malignant neoplasm of colon: Secondary | ICD-10-CM | POA: Diagnosis not present

## 2018-06-14 DIAGNOSIS — Z79899 Other long term (current) drug therapy: Secondary | ICD-10-CM | POA: Diagnosis not present

## 2018-06-14 DIAGNOSIS — K640 First degree hemorrhoids: Secondary | ICD-10-CM | POA: Diagnosis not present

## 2018-06-14 DIAGNOSIS — Z7982 Long term (current) use of aspirin: Secondary | ICD-10-CM | POA: Insufficient documentation

## 2018-06-14 HISTORY — DX: Prediabetes: R73.03

## 2018-06-14 HISTORY — PX: COLONOSCOPY WITH PROPOFOL: SHX5780

## 2018-06-14 SURGERY — COLONOSCOPY WITH PROPOFOL
Anesthesia: General

## 2018-06-14 MED ORDER — SODIUM CHLORIDE 0.9 % IV SOLN
INTRAVENOUS | Status: DC
  Administered 2018-06-14: 13:00:00 via INTRAVENOUS
  Administered 2018-06-14: 1000 mL via INTRAVENOUS

## 2018-06-14 MED ORDER — PROPOFOL 500 MG/50ML IV EMUL
INTRAVENOUS | Status: DC | PRN
  Administered 2018-06-14: 100 ug/kg/min via INTRAVENOUS

## 2018-06-14 NOTE — Interval H&P Note (Signed)
History and Physical Interval Note:  06/14/2018 1:21 PM  Julie [name]  has presented today for surgery, with the diagnosis of personal hx colon polyps  The various methods of treatment have been discussed with the patient and family. After consideration of risks, benefits and other options for treatment, the patient has consented to  Procedure(s): COLONOSCOPY WITH PROPOFOL (N/A) as a surgical intervention .  The patient's history has been reviewed, patient examined, no change in status, stable for surgery.  I have reviewed the patient's chart and labs.  Questions were answered to the patient's satisfaction.     [place], [place]

## 2018-06-14 NOTE — Anesthesia Preprocedure Evaluation (Signed)
Anesthesia Evaluation  Patient identified by MRN, date of birth, ID band Patient awake    Reviewed: Allergy & Precautions, H&P , NPO status , Patient's Chart, lab work & pertinent test results, reviewed documented beta blocker date and time   History of Anesthesia Complications Negative for: history of anesthetic complications  Airway Mallampati: III  TM Distance: >3 FB Neck ROM: full    Dental no notable dental hx. (+) Partial Upper, Missing, Partial Lower   Pulmonary shortness of breath and with exertion, asthma , neg sleep apnea, neg COPD, Recent URI , Resolved, former smoker,           Cardiovascular Exercise Tolerance: Good hypertension, On Medications and On Home Beta Blockers (-) angina(-) CAD, (-) Past MI, (-) Cardiac Stents and (-) CABG (-) dysrhythmias (-) Valvular Problems/Murmurs     Neuro/Psych negative neurological ROS  negative psych ROS   GI/Hepatic Neg liver ROS, GERD  Medicated and Controlled,  Endo/Other  negative endocrine ROSdiabetes  Renal/GU negative Renal ROS  negative genitourinary   Musculoskeletal   Abdominal   Peds  Hematology  (+) Blood dyscrasia, anemia ,   Anesthesia Other Findings Past Medical History:   Hypertension                                                 Arthritis                                                    Personal history of malignant neoplasm of brea*              Cancer ([place])                                    2007           Comment:Right breast   Breast cancer ([place])                                          Cancer of breast ([place])                          04/30/2015   Anemia                                                       Shortness of breath dyspnea                                  GERD (gastroesophageal reflux disease)                       Reproductive/Obstetrics negative OB ROS  Anesthesia  Physical  Anesthesia Plan  ASA: III  Anesthesia Plan: General   Post-op Pain Management:    Induction: Intravenous  PONV Risk Score and Plan: 3 and Propofol infusion and TIVA  Airway Management Planned: Natural Airway and Nasal Cannula  Additional Equipment:   Intra-op Plan:   Post-operative Plan:   Informed Consent: I have reviewed the patients History and Physical, chart, labs and discussed the procedure including the risks, benefits and alternatives for the proposed anesthesia with the patient or authorized representative who has indicated his/her understanding and acceptance.   Dental Advisory Given  Plan Discussed with: Anesthesiologist, CRNA and Surgeon  Anesthesia Plan Comments:         Anesthesia Quick Evaluation

## 2018-06-14 NOTE — H&P (Signed)
Outpatient short stay form Pre-procedure 06/14/2018 1:20 PM Teodoro K. Alice Reichert, M.D.  Primary Physician: [place] Pink, M.D.  Reason for visit:  Personal hx of colon polyps.  History of present illness:                            Patient presents for colonoscopy for a personal hx of colon polyps. The patient denies abdominal pain, abnormal weight loss or rectal bleeding.     Current Facility-Administered Medications:  .  0.9 %  sodium chloride infusion, , Intravenous, Continuous, [place], Benay Pike, MD, Last Rate: 20 mL/hr at 06/14/18 1307, 1,000 mL at 06/14/18 1307  Medications Prior to Admission  Medication Sig Dispense Refill Last Dose  . amLODipine (NORVASC) 10 MG tablet Take 1 tablet by mouth daily.   06/14/2018 at 1000am  . KLOR-CON M20 20 MEQ tablet Take 60 mEq by mouth once.    06/13/2018 at Unknown time  . omeprazole (PRILOSEC) 20 MG capsule Take 20 mg by mouth daily.   06/13/2018 at Unknown time  . simvastatin (ZOCOR) 20 MG tablet Take 20 mg by mouth daily at 6 PM.    06/13/2018 at Unknown time  . Triamcinolone Acetonide (NASACORT AQ NA) Place 2 sprays into the nose daily.   Past Week at Unknown time  . triamterene-hydrochlorothiazide (MAXZIDE-25) 37.5-25 MG per tablet Take 1 tablet by mouth daily.    06/14/2018 at 1000amnknown time  . aspirin EC 81 MG tablet Take 1 tablet by mouth daily.   Not Taking at Unknown time  . cyanocobalamin (,VITAMIN B-12,) 1000 MCG/ML injection Inject 1,000 mcg into the muscle every 30 (thirty) days.   Taking     Allergies  Allergen Reactions  . Gabapentin Other (See Comments)    dizziness  . Penicillin G Rash     Past Medical History:  Diagnosis Date  . Anemia   . Arthritis   . Breast cancer ([place]) 2007   right breast cancer  . Breast cancer ([place]) 2016   recurrent right breast cancer, triple negative  . Cancer [facility]) 2007   Right breast  . Cancer of breast ([place]) 04/30/2015  . Diabetes mellitus without complication ([place])   . GERD  (gastroesophageal reflux disease)   . Hypertension   . Personal history of malignant neoplasm of breast   . Pre-diabetes   . Shortness of breath dyspnea     Review of systems:  Otherwise negative.    Physical Exam  Gen: Alert, oriented. Appears stated age.  HEENT: [place]/AT. PERRLA. Lungs: CTA, no wheezes. CV: RR nl S1, S2. Abd: soft, benign, no masses. BS+ Ext: No edema. Pulses 2+    Planned procedures: Proceed with colonoscopy. The patient understands the nature of the planned procedure, indications, risks, alternatives and potential complications including but not limited to bleeding, infection, perforation, damage to internal organs and possible oversedation/side effects from anesthesia. The patient agrees and gives consent to proceed.  Please refer to procedure notes for findings, recommendations and patient disposition/instructions.     Teodoro K. Alice Reichert, M.D. Gastroenterology 06/14/2018  1:20 PM

## 2018-06-14 NOTE — Anesthesia Post-op Follow-up Note (Signed)
Anesthesia QCDR form completed.        

## 2018-06-14 NOTE — Transfer of Care (Signed)
Immediate Anesthesia Transfer of Care Note  Patient: Julie [name]  Procedure(s) Performed: COLONOSCOPY WITH PROPOFOL (N/A )  Patient Location: PACU  Anesthesia Type:MAC  Level of Consciousness: awake  Airway & Oxygen Therapy: Patient Spontanous Breathing  Post-op Assessment: Post -op Vital signs reviewed and stable  Post vital signs: Reviewed  Last Vitals:  Vitals Value Taken Time  BP 135/65 06/14/2018  2:01 PM  Temp    Pulse 53 06/14/2018  2:03 PM  Resp 14 06/14/2018  2:03 PM  SpO2 98 % 06/14/2018  2:03 PM  Vitals shown include unvalidated device data.  Last Pain:  Vitals:   06/14/18 1401  TempSrc:   PainSc: 0-No pain      Patients Stated Pain Goal: 0 ([id number])  Complications: No apparent anesthesia complications

## 2018-06-15 ENCOUNTER — Encounter: Payer: Self-pay | Admitting: Internal Medicine

## 2018-06-15 NOTE — Anesthesia Postprocedure Evaluation (Signed)
Anesthesia Post Note  Patient: Julie [name]  Procedure(s) Performed: COLONOSCOPY WITH PROPOFOL (N/A )  Patient location during evaluation: Endoscopy Anesthesia Type: General Level of consciousness: awake and alert Pain management: pain level controlled Vital Signs Assessment: post-procedure vital signs reviewed and stable Respiratory status: spontaneous breathing, nonlabored ventilation, respiratory function stable and patient connected to nasal cannula oxygen Cardiovascular status: blood pressure returned to baseline and stable Postop Assessment: no apparent nausea or vomiting Anesthetic complications: no     Last Vitals:  Vitals:   06/14/18 1401 06/14/18 1410  BP: 135/65 (!) 147/60  Pulse: (!) 59 (!) 54  Resp: 11 12  Temp:    SpO2: 99% 99%    Last Pain:  Vitals:   06/15/18 0737  TempSrc:   PainSc: 0-No pain                 Martha Clan

## 2018-06-15 NOTE — Op Note (Signed)
[facility] Gastroenterology Patient Name: Julie [name] Procedure Date: 06/14/2018 1:14 PM MRN: [id number] Account #: [id number] Date of Birth: 11/13/1940 Admit Type: Outpatient Age: 77 Room: [facility] ENDO ROOM 3 Gender: Female Note Status: Finalized Procedure:            Colonoscopy Indications:          High risk colon cancer surveillance: Personal history                        of colonic polyps Providers:            Benay Pike. Alice Reichert MD, MD Referring MD:         Irven Easterly. Kary Kos, MD (Referring MD) Medicines:            Propofol per Anesthesia Complications:        No immediate complications. Procedure:            Pre-Anesthesia Assessment:                       - The risks and benefits of the procedure and the                        sedation options and risks were discussed with the                        patient. All questions were answered and informed                        consent was obtained.                       - The risks and benefits of the procedure and the                        sedation options and risks were discussed with the                        patient. All questions were answered and informed                        consent was obtained.                       - Patient identification and proposed procedure were                        verified prior to the procedure by the nurse. The                        procedure was verified in the procedure room.                       - ASA Grade Assessment: III - A patient with severe                        systemic disease.                       - After reviewing the risks and benefits, the patient  was deemed in satisfactory condition to undergo the                        procedure.                       After obtaining informed consent, the colonoscope was                        passed under direct vision. Throughout the procedure,                        the patient's blood  pressure, pulse, and oxygen                        saturations were monitored continuously. The                        Colonoscope was introduced through the anus and                        advanced to the the cecum, identified by appendiceal                        orifice and ileocecal valve. The colonoscopy was                        performed without difficulty. The patient tolerated the                        procedure well. The quality of the bowel preparation                        was good. Findings:      The perianal and digital rectal examinations were normal. Pertinent       negatives include normal sphincter tone and no palpable rectal lesions.      Two medium-sized localized angiodysplastic lesions without bleeding were       found in the transverse colon and in the cecum.      Non-bleeding internal hemorrhoids were found during retroflexion. The       hemorrhoids were Grade I (internal hemorrhoids that do not prolapse).      The exam was otherwise without abnormality. Impression:           - Two non-bleeding colonic angiodysplastic lesions.                       - Non-bleeding internal hemorrhoids.                       - The examination was otherwise normal.                       - No specimens collected. Recommendation:       - Patient has a contact number available for                        emergencies. The signs and symptoms of potential                        delayed complications were discussed with the patient.  Return to normal activities tomorrow. Written discharge                        instructions were provided to the patient.                       - Resume previous diet.                       - Continue present medications.                       - No repeat colonoscopy due to current age (48 years or                        older).                       - Return to GI office PRN.                       - The findings and recommendations  were discussed with                        the patient and their family. Procedure Code(s):    --- Professional ---                       A1287, Colorectal cancer screening; colonoscopy on                        individual at high risk Diagnosis Code(s):    --- Professional ---                       K64.0, First degree hemorrhoids                       K55.20, Angiodysplasia of colon without hemorrhage                       Z86.010, Personal history of colonic polyps CPT copyright 2018 American Medical Association. All rights reserved. The codes documented in this report are preliminary and upon coder review may  be revised to meet current compliance requirements. Efrain Sella MD, MD 06/14/2018 1:48:02 PM This report has been signed electronically. Number of Addenda: 0 Note Initiated On: 06/14/2018 1:14 PM Scope Withdrawal Time: 0 hours 7 minutes 12 seconds  Total Procedure Duration: 0 hours 13 minutes 1 second       [facility]

## 2018-06-20 ENCOUNTER — Encounter: Payer: Self-pay | Admitting: General Surgery

## 2018-06-20 ENCOUNTER — Ambulatory Visit: Payer: Medicare HMO | Admitting: General Surgery

## 2018-06-20 ENCOUNTER — Other Ambulatory Visit: Payer: Self-pay

## 2018-06-20 VITALS — BP 147/81 | HR 72 | Temp 97.8°F | Resp 16 | Ht 64.0 in | Wt 176.0 lb

## 2018-06-20 DIAGNOSIS — C50811 Malignant neoplasm of overlapping sites of right female breast: Secondary | ICD-10-CM

## 2018-06-20 DIAGNOSIS — Z171 Estrogen receptor negative status [ER-]: Secondary | ICD-10-CM | POA: Diagnosis not present

## 2018-06-20 NOTE — Patient Instructions (Addendum)
Patient to have a left diagnotic mammogram. Samantha at [facility] will call the patient to schedule this. The patient is aware to call back for any questions or concerns.

## 2018-06-20 NOTE — Progress Notes (Signed)
Patient ID: Julie [name], female   DOB: 08-Dec-1940, 77 y.o.   MRN: [id number]  Chief Complaint  Patient presents with  . Follow-up    HPI Julie [name] is a 77 y.o. female who presents for a breast evaluation. The most recent left screening mammogram was done on 06/12/2018.  Patient does perform regular self breast checks and gets regular mammograms done.    HPI  Past Medical History:  Diagnosis Date  . Anemia   . Arthritis   . Breast cancer ([place]) 2007   right breast cancer  . Breast cancer ([place]) 2016   recurrent right breast cancer, triple negative  . Cancer [facility]) 2007   Right breast  . Cancer of breast ([place]) 04/30/2015  . Diabetes mellitus without complication ([place])   . GERD (gastroesophageal reflux disease)   . Hypertension   . Personal history of malignant neoplasm of breast   . Pre-diabetes   . Shortness of breath dyspnea     Past Surgical History:  Procedure Laterality Date  . ABDOMINAL HYSTERECTOMY    . APPENDECTOMY    . BREAST BIOPSY Right 2007 and 2016   positive  . BREAST EXCISIONAL BIOPSY Left    neg  . BREAST LUMPECTOMY Right 2007  . CATARACT EXTRACTION, BILATERAL Bilateral   . COLONOSCOPY WITH PROPOFOL N/A 06/14/2018   Procedure: COLONOSCOPY WITH PROPOFOL;  Surgeon: [place], Benay Pike, MD;  Location: [facility] ENDOSCOPY;  Service: Gastroenterology;  Laterality: N/A;  . JOINT REPLACEMENT Left 2011   knee  . JOINT REPLACEMENT Right 12/2011   knee  . KNEE SURGERY Right 03/2012  . MASTECTOMY MODIFIED RADICAL Right 05/22/2015   Procedure: MASTECTOMY MODIFIED RADICAL;  Surgeon: Christene Lye, MD;  Location: [facility] ORS;  Service: General;  Laterality: Right;    Family History  Problem Relation Age of Onset  . Breast cancer Mother     Social History Social History   Tobacco Use  . Smoking status: Former Smoker    Packs/day: 0.75    Years: 2.00    Pack years: 1.50    Types: Cigarettes    Last attempt to quit: 05/15/1980    Years since quitting:  38.1  . Smokeless tobacco: Never Used  Substance Use Topics  . Alcohol use: No  . Drug use: No    Allergies  Allergen Reactions  . Gabapentin Other (See Comments)    dizziness  . Penicillin G Rash    Current Outpatient Medications  Medication Sig Dispense Refill  . amLODipine (NORVASC) 10 MG tablet Take 1 tablet by mouth daily.    . cyanocobalamin (,VITAMIN B-12,) 1000 MCG/ML injection Inject 1,000 mcg into the muscle every 30 (thirty) days.    Marland Kitchen KLOR-CON M20 20 MEQ tablet Take 60 mEq by mouth once.     . simvastatin (ZOCOR) 20 MG tablet Take 20 mg by mouth daily at 6 PM.     . triamterene-hydrochlorothiazide (MAXZIDE-25) 37.5-25 MG per tablet Take 1 tablet by mouth daily.      No current facility-administered medications for this visit.     Review of Systems Review of Systems  Constitutional: Negative.   Respiratory: Negative.   Cardiovascular: Negative.     Blood pressure (!) 147/81, pulse 72, temperature 97.8 F (36.6 C), temperature source Skin, resp. rate 16, height 5\' 4"  (1.626 m), weight 176 lb (79.8 kg), SpO2 97 %.  Physical Exam Physical Exam  Constitutional: She is oriented to person, place, and time. She appears well-developed and well-nourished.  Eyes: Conjunctivae are normal. No scleral icterus.  Neck: Neck supple.  Cardiovascular: Normal rate, regular rhythm and normal heart sounds.  Pulmonary/Chest: Effort normal and breath sounds normal. Left breast exhibits no inverted nipple, no mass, no nipple discharge, no skin change and no tenderness.  Right mastectomy site is clean and well healed.   Lymphadenopathy:    She has no cervical adenopathy.    She has no axillary adenopathy.  Neurological: She is alert and oriented to person, place, and time.  Skin: Skin is warm and dry.    Data Reviewed Left screening mammogram of June 12, 2018 was reviewed.  BI-RADS-1.  Assessment    No evidence of recurrent disease.   Plan     Patient to have a left  screening mammogram in 1 year. The patient is aware to call back for any questions or concerns.    HPI, Physical Exam, Assessment and Plan have been scribed under the direction and in the presence of Julie Ard, MD.  Gaspar Cola, CMA  I have completed the exam and reviewed the above documentation for accuracy and completeness.  I agree with the above.  Haematologist has been used and any errors in dictation or transcription are unintentional.  Julie [name], M.D., F.A.C.S.  Forest Gleason Morelia Cassells 06/22/2018, 7:44 AM

## 2018-06-21 ENCOUNTER — Ambulatory Visit
Admission: RE | Admit: 2018-06-21 | Discharge: 2018-06-21 | Disposition: A | Discharge status: Home / Self Care | Payer: Medicare HMO | Source: Ambulatory Visit | Attending: General Surgery | Admitting: General Surgery

## 2018-06-21 DIAGNOSIS — R928 Other abnormal and inconclusive findings on diagnostic imaging of breast: Secondary | ICD-10-CM | POA: Diagnosis not present

## 2018-06-21 DIAGNOSIS — R921 Mammographic calcification found on diagnostic imaging of breast: Secondary | ICD-10-CM

## 2018-06-26 ENCOUNTER — Other Ambulatory Visit: Payer: Self-pay

## 2018-06-26 ENCOUNTER — Inpatient Hospital Stay: Payer: Medicare HMO

## 2018-06-26 ENCOUNTER — Inpatient Hospital Stay: Payer: Medicare HMO | Attending: Internal Medicine | Admitting: Internal Medicine

## 2018-06-26 VITALS — BP 148/79 | HR 62 | Temp 97.3°F

## 2018-06-26 DIAGNOSIS — E876 Hypokalemia: Secondary | ICD-10-CM | POA: Diagnosis not present

## 2018-06-26 DIAGNOSIS — Z171 Estrogen receptor negative status [ER-]: Principal | ICD-10-CM

## 2018-06-26 DIAGNOSIS — G8928 Other chronic postprocedural pain: Secondary | ICD-10-CM

## 2018-06-26 DIAGNOSIS — C50811 Malignant neoplasm of overlapping sites of right female breast: Secondary | ICD-10-CM | POA: Diagnosis not present

## 2018-06-26 DIAGNOSIS — Z803 Family history of malignant neoplasm of breast: Secondary | ICD-10-CM | POA: Diagnosis not present

## 2018-06-26 DIAGNOSIS — Z87891 Personal history of nicotine dependence: Secondary | ICD-10-CM | POA: Diagnosis not present

## 2018-06-26 DIAGNOSIS — Z9011 Acquired absence of right breast and nipple: Secondary | ICD-10-CM

## 2018-06-26 DIAGNOSIS — Z809 Family history of malignant neoplasm, unspecified: Secondary | ICD-10-CM

## 2018-06-26 LAB — COMPREHENSIVE METABOLIC PANEL
ALT: 17 U/L (ref 0–44)
AST: 22 U/L (ref 15–41)
Albumin: 4.5 g/dL (ref 3.5–5.0)
Alkaline Phosphatase: 87 U/L (ref 38–126)
Anion gap: 11 (ref 5–15)
BUN: 13 mg/dL (ref 8–23)
CO2: 27 mmol/L (ref 22–32)
Calcium: 9.7 mg/dL (ref 8.9–10.3)
Chloride: 103 mmol/L (ref 98–111)
Creatinine, Ser: 0.96 mg/dL (ref 0.44–1.00)
GFR calc Af Amer: >60 mL/min (ref >60)
GFR calc non Af Amer: 57 mL/min — ABNORMAL LOW (ref >60)
GLUCOSE: 113 mg/dL — AB (ref 70–99)
Potassium: 3.5 mmol/L (ref 3.5–5.1)
Sodium: 141 mmol/L (ref 135–145)
Total Bilirubin: 0.5 mg/dL (ref 0.3–1.2)
Total Protein: 7.2 g/dL (ref 6.5–8.1)

## 2018-06-26 LAB — CBC WITH DIFFERENTIAL/PLATELET
Abs Immature Granulocytes: 0.01 10*3/uL (ref 0.00–0.07)
Basophils Absolute: 0 10*3/uL (ref 0.0–0.1)
Basophils Relative: 1 %
Eosinophils Absolute: 0.2 10*3/uL (ref 0.0–0.5)
Eosinophils Relative: 5 %
HCT: 40.5 % (ref 36.0–46.0)
Hemoglobin: 12.7 g/dL (ref 12.0–15.0)
Immature Granulocytes: 0 %
Lymphocytes Relative: 28 %
Lymphs Abs: 1.1 10*3/uL (ref 0.7–4.0)
MCH: 26.6 pg (ref 26.0–34.0)
MCHC: 31.4 g/dL (ref 30.0–36.0)
MCV: 84.7 fL (ref 80.0–100.0)
Monocytes Absolute: 0.4 10*3/uL (ref 0.1–1.0)
Monocytes Relative: 9 %
Neutro Abs: 2.2 10*3/uL (ref 1.7–7.7)
Neutrophils Relative %: 57 %
Platelets: 235 10*3/uL (ref 150–400)
RBC: 4.78 MIL/uL (ref 3.87–5.11)
RDW: 14.6 % (ref 11.5–15.5)
WBC: 3.9 10*3/uL — AB (ref 4.0–10.5)
nRBC: 0 % (ref 0.0–0.2)

## 2018-06-26 NOTE — Assessment & Plan Note (Addendum)
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  No evidence of recurrence.  Clinically stable.  #Left breast calcifications -followed by diagnostic mammogram December 2019 ; awaiting repeat mammogram in 6 months.   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 Prostate cancer at 64y; mother- died of breast cancer at 45y- genetic cousnelling-discussed regarding genetic counseling.  Patient interested.  # Hypokalemia-stable.  Continue potassium.  Encouraged increased intake of potassium rich foods.  # DISPOSITION: # referral to [facility] # follow-up in 6 months with labs-cbc/cmp/ca-27-29-Dr.B.   CC: Dr.Hedrick, Jeneen Rinks

## 2018-06-26 NOTE — Progress Notes (Signed)
[place] OFFICE PROGRESS NOTE  Patient Care Team: [place] Pink, [name], [name], [name]  Cancer Staging No matching staging information was found for the patient.    Oncology History   1. Carcinoma of the breast, status post lumpectomy and sentinel lymph node biopsy. June 2007ER/PR negative, Her-2 negative. PT2N1(mic)M0, STAGE IIb 2. Completed 6 cycles Cytoxan/Taxotere October 2007. Right breast XRT. 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 4. Right mastectomy, May 22, 2015. Location of mass was at 9:00. All the margins were clear. 4 lymph nodes were negative for any tumor. Invasive mammary carcinoma with apocrine features. Margins are negative. Informal vascular invasion present, rpT2 pNO cMO (triple negative). --------------------------------------------   DIAGNOSIS: Breast cancer  STAGE:  II   ;GOALS: cure  CURRENT/MOST RECENT THERAPY : survielleince      Carcinoma of overlapping sites of right breast in female, estrogen receptor negative ([place])   04/05/2016 Initial Diagnosis    Cancer of overlapping sites of right female breast [facility])        INTERVAL HISTORY:  Julie [name] 77 y.o.  female pleasant patient above history of Breast cancer is here for follow-up.  Patient states that she had a recent abnormal mammogram on the left side for which she had a diagnostic mammogram.  She has been anxious about this.  Denies any new lumps or bumps.  Denies any unusual cough or shortness of breath or chest pain.  Chronic right-sided mastectomy pain.  No headaches.  No bone pain.  Review of Systems  Constitutional: Negative for chills, diaphoresis, fever, malaise/fatigue and weight loss.  HENT: Negative for  nosebleeds and sore throat.   Eyes: Negative for double vision.  Respiratory: Negative for cough, hemoptysis, sputum production, shortness of breath and wheezing.   Cardiovascular: Negative for chest pain, palpitations, orthopnea and leg swelling.  Gastrointestinal: Negative for abdominal pain, blood in stool, constipation, diarrhea, heartburn, melena, nausea and vomiting.  Genitourinary: Negative for dysuria, frequency and urgency.  Musculoskeletal: Negative for back pain and joint pain.  Skin: Negative.  Negative for itching and rash.  Neurological: Negative for dizziness, tingling, focal weakness, weakness and headaches.  Endo/Heme/Allergies: Does not bruise/bleed easily.  Psychiatric/Behavioral: Negative for depression. The patient is not nervous/anxious and does not have insomnia.      PAST MEDICAL HISTORY :  Past Medical History:  Diagnosis Date  . Anemia   . Arthritis   . Breast cancer ([place]) 2007   right breast cancer  . Breast cancer ([place]) 2016   recurrent right breast cancer, triple negative  . Cancer [facility]) 2007   Right breast  . Cancer of breast ([place]) 04/30/2015  . Diabetes mellitus without complication ([place])   . GERD (gastroesophageal reflux disease)   . Hypertension   . Personal history of malignant neoplasm of breast   . Pre-diabetes   . Shortness of breath dyspnea     PAST SURGICAL HISTORY :   Past Surgical History:  Procedure Laterality Date  . ABDOMINAL HYSTERECTOMY    . APPENDECTOMY    . BREAST BIOPSY Right 2007 and 2016   positive  . BREAST EXCISIONAL BIOPSY Left    neg  . BREAST LUMPECTOMY Right 2007  . CATARACT EXTRACTION, BILATERAL Bilateral   . COLONOSCOPY WITH PROPOFOL N/A 06/14/2018  Procedure: COLONOSCOPY WITH PROPOFOL;  Surgeon: Toledo, Benay Pike, MD;  Location: [facility] ENDOSCOPY;  Service: Gastroenterology;  Laterality: N/A;  . JOINT REPLACEMENT Left 2011   knee  . JOINT REPLACEMENT Right 12/2011   knee  . KNEE SURGERY Right 03/2012  .  MASTECTOMY MODIFIED RADICAL Right 05/22/2015   Procedure: MASTECTOMY MODIFIED RADICAL;  Surgeon: Christene Lye, MD;  Location: [facility] ORS;  Service: General;  Laterality: Right;    FAMILY HISTORY :   Family History  Problem Relation Age of Onset  . Breast cancer Mother     SOCIAL HISTORY:   Social History   Tobacco Use  . Smoking status: Former Smoker    Packs/day: 0.75    Years: 2.00    Pack years: 1.50    Types: Cigarettes    Last attempt to quit: 05/15/1980    Years since quitting: 38.1  . Smokeless tobacco: Never Used  Substance Use Topics  . Alcohol use: No  . Drug use: No    ALLERGIES:  is allergic to gabapentin and penicillin g.  MEDICATIONS:  Current Outpatient Medications  Medication Sig Dispense Refill  . amLODipine (NORVASC) 10 MG tablet Take 1 tablet by mouth daily.    . cyanocobalamin (,VITAMIN B-12,) 1000 MCG/ML injection Inject 1,000 mcg into the muscle every 30 (thirty) days.    Marland Kitchen KLOR-CON M20 20 MEQ tablet Take 60 mEq by mouth once.     . simvastatin (ZOCOR) 20 MG tablet Take 20 mg by mouth daily at 6 PM.     . triamterene-hydrochlorothiazide (MAXZIDE-25) 37.5-25 MG per tablet Take 1 tablet by mouth daily.      No current facility-administered medications for this visit.     PHYSICAL EXAMINATION: ECOG PERFORMANCE STATUS: 0 - Asymptomatic  BP (!) 148/79 (BP Location: Left Arm, Patient Position: Sitting)   Pulse 62   Temp (!) 97.3 F (36.3 C) (Tympanic)   There were no vitals filed for this visit.  Physical Exam  Constitutional: She is oriented to person, place, and time and well-developed, well-nourished, and in no distress.  HENT:  Head: Normocephalic and atraumatic.  Mouth/Throat: Oropharynx is clear and moist. No oropharyngeal exudate.  Eyes: Pupils are equal, round, and reactive to light.  Neck: Normal range of motion. Neck supple.  Cardiovascular: Normal rate and regular rhythm.  Pulmonary/Chest: No respiratory distress. She has no  wheezes.  Abdominal: Soft. Bowel sounds are normal. She exhibits no distension and no mass. There is no abdominal tenderness. There is no rebound and no guarding.  Musculoskeletal: Normal range of motion.        General: No tenderness or edema.  Neurological: She is alert and oriented to person, place, and time.  Skin: Skin is warm.  Psychiatric: Affect normal.      LABORATORY DATA:  I have reviewed the data as listed    Component Value Date/Time   NA 141 06/26/2018 1400   NA 138 04/25/2015 1210   NA 141 03/15/2012 1210   K 3.5 06/26/2018 1400   K 3.2 (L) 03/21/2012 1256   CL 103 06/26/2018 1400   CL 102 03/15/2012 1210   CO2 27 06/26/2018 1400   CO2 33 (H) 03/15/2012 1210   GLUCOSE 113 (H) 06/26/2018 1400   GLUCOSE 113 (H) 03/15/2012 1210   BUN 13 06/26/2018 1400   BUN 15 04/25/2015 1210   BUN 10 03/15/2012 1210   CREATININE 0.96 06/26/2018 1400   CREATININE 0.75 03/15/2012 1210   CALCIUM 9.7 06/26/2018 1400  CALCIUM 9.9 03/15/2012 1210   PROT 7.2 06/26/2018 1400   PROT 7.1 04/25/2015 1210   ALBUMIN 4.5 06/26/2018 1400   ALBUMIN 4.7 04/25/2015 1210   AST 22 06/26/2018 1400   ALT 17 06/26/2018 1400   ALKPHOS 87 06/26/2018 1400   BILITOT 0.5 06/26/2018 1400   BILITOT 0.3 04/25/2015 1210   GFRNONAA 57 (L) 06/26/2018 1400   GFRNONAA >60 03/15/2012 1210   GFRAA >60 06/26/2018 1400   GFRAA >60 03/15/2012 1210    No results found for: SPEP, UPEP  Lab Results  Component Value Date   WBC 3.9 (L) 06/26/2018   NEUTROABS 2.2 06/26/2018   HGB 12.7 06/26/2018   HCT 40.5 06/26/2018   MCV 84.7 06/26/2018   PLT 235 06/26/2018      Chemistry      Component Value Date/Time   NA 141 06/26/2018 1400   NA 138 04/25/2015 1210   NA 141 03/15/2012 1210   K 3.5 06/26/2018 1400   K 3.2 (L) 03/21/2012 1256   CL 103 06/26/2018 1400   CL 102 03/15/2012 1210   CO2 27 06/26/2018 1400   CO2 33 (H) 03/15/2012 1210   BUN 13 06/26/2018 1400   BUN 15 04/25/2015 1210   BUN 10  03/15/2012 1210   CREATININE 0.96 06/26/2018 1400   CREATININE 0.75 03/15/2012 1210      Component Value Date/Time   CALCIUM 9.7 06/26/2018 1400   CALCIUM 9.9 03/15/2012 1210   ALKPHOS 87 06/26/2018 1400   AST 22 06/26/2018 1400   ALT 17 06/26/2018 1400   BILITOT 0.5 06/26/2018 1400   BILITOT 0.3 04/25/2015 1210       RADIOGRAPHIC STUDIES: I have personally reviewed the radiological images as listed and agreed with the findings in the report. No results found.   ASSESSMENT & PLAN:  Carcinoma of overlapping sites of right breast in female, estrogen receptor negative ([place]) # Right breast ipsilateral breast recurrence triple negative [Nov 2016] s/p Lumpectomy; no adjuvant chemotherapy.  No evidence of recurrence.  Clinically stable.  #Left breast calcifications -followed by diagnostic mammogram December 2019 ; awaiting repeat mammogram in 6 months.   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 Prostate cancer at 67y; mother- died of breast cancer at 58y- genetic cousnelling-discussed regarding genetic counseling.  Patient interested.  # Hypokalemia-stable.  Continue potassium.  Encouraged increased intake of potassium rich foods.  # DISPOSITION: # referral to [facility] # follow-up in 6 months with labs-cbc/cmp/ca-27-29-Dr.B.   CC: Dr.Hedrick, Jeneen Rinks    No orders of the defined types were placed in this encounter.  All questions were answered. The patient knows to call the clinic with any problems, questions or concerns.      Cammie Sickle, MD 06/26/2018 3:23 PM

## 2018-06-27 LAB — CANCER ANTIGEN 27.29: CA 27.29: 5.7 U/mL (ref 0.0–38.6)

## 2018-09-29 ENCOUNTER — Telehealth: Payer: Self-pay | Admitting: Licensed Clinical Social Worker

## 2018-09-29 NOTE — Telephone Encounter (Signed)
Rescheduled genetics appointment  from 3/26 to 8/27 at 9 am

## 2018-10-05 ENCOUNTER — Inpatient Hospital Stay: Payer: Medicare HMO | Admitting: Licensed Clinical Social Worker

## 2018-11-13 ENCOUNTER — Telehealth: Payer: Self-pay | Admitting: *Deleted

## 2018-11-13 NOTE — Telephone Encounter (Signed)
Patient contacted the office stating that she received a letter stating she needed to have a mammogram.   The patient was last seen in 06-2018 by Dr. Bary Castilla. His office note states for patient to follow up in one year with a mammogram.   Patient's mammogram report states she needs a mammogram in 6 months- June 2020.  Note to Dr. Bary Castilla to please review.

## 2018-11-14 NOTE — Telephone Encounter (Signed)
At the patient's last visit I was comfortable with a one year f/u mammogram. If she wants to follow the radiologists recommendation for an early mamogram in June, this will be scheduled, otherwise will repeat the mammogram in December 2020.

## 2018-11-15 NOTE — Telephone Encounter (Signed)
Patient contacted today and notified per Dr. Dwyane Luo previous note.   The patient would like to proceed with mammogram in June 2020.  Patient notified that this may be delayed with COVID-19 restrictions and mammography's reduced hours. She verbalizes understanding.   Patient placed in the recalls for 12-2018.

## 2018-12-22 ENCOUNTER — Telehealth: Payer: Self-pay | Admitting: Internal Medicine

## 2018-12-22 ENCOUNTER — Other Ambulatory Visit: Payer: Self-pay | Admitting: *Deleted

## 2018-12-22 DIAGNOSIS — C50811 Malignant neoplasm of overlapping sites of right female breast: Secondary | ICD-10-CM

## 2018-12-22 NOTE — Telephone Encounter (Signed)
Called pt for appt pre-screen but got no answer and no vm msg option. I called home #, got no answer, no vm; called mobile #and woman answered saying it was the wrong number

## 2018-12-25 ENCOUNTER — Other Ambulatory Visit: Payer: Self-pay

## 2018-12-25 ENCOUNTER — Inpatient Hospital Stay: Payer: Medicare HMO | Attending: Internal Medicine

## 2018-12-25 ENCOUNTER — Inpatient Hospital Stay (HOSPITAL_BASED_OUTPATIENT_CLINIC_OR_DEPARTMENT_OTHER): Payer: Medicare HMO | Admitting: Internal Medicine

## 2018-12-25 DIAGNOSIS — Z87891 Personal history of nicotine dependence: Secondary | ICD-10-CM | POA: Insufficient documentation

## 2018-12-25 DIAGNOSIS — C50811 Malignant neoplasm of overlapping sites of right female breast: Secondary | ICD-10-CM | POA: Insufficient documentation

## 2018-12-25 DIAGNOSIS — E876 Hypokalemia: Secondary | ICD-10-CM | POA: Diagnosis not present

## 2018-12-25 DIAGNOSIS — Z803 Family history of malignant neoplasm of breast: Secondary | ICD-10-CM | POA: Diagnosis not present

## 2018-12-25 DIAGNOSIS — Z171 Estrogen receptor negative status [ER-]: Secondary | ICD-10-CM | POA: Insufficient documentation

## 2018-12-25 DIAGNOSIS — M542 Cervicalgia: Secondary | ICD-10-CM | POA: Insufficient documentation

## 2018-12-25 DIAGNOSIS — Z9011 Acquired absence of right breast and nipple: Secondary | ICD-10-CM

## 2018-12-25 DIAGNOSIS — Z8042 Family history of malignant neoplasm of prostate: Secondary | ICD-10-CM

## 2018-12-25 DIAGNOSIS — R920 Mammographic microcalcification found on diagnostic imaging of breast: Secondary | ICD-10-CM

## 2018-12-25 DIAGNOSIS — R921 Mammographic calcification found on diagnostic imaging of breast: Secondary | ICD-10-CM

## 2018-12-25 LAB — CBC WITH DIFFERENTIAL/PLATELET
Abs Immature Granulocytes: 0.01 10*3/uL (ref 0.00–0.07)
Basophils Absolute: 0 10*3/uL (ref 0.0–0.1)
Basophils Relative: 0 %
Eosinophils Absolute: 0.1 10*3/uL (ref 0.0–0.5)
Eosinophils Relative: 2 %
HCT: 39.2 % (ref 36.0–46.0)
Hemoglobin: 12.4 g/dL (ref 12.0–15.0)
Immature Granulocytes: 0 %
Lymphocytes Relative: 20 %
Lymphs Abs: 1.1 10*3/uL (ref 0.7–4.0)
MCH: 26.2 pg (ref 26.0–34.0)
MCHC: 31.6 g/dL (ref 30.0–36.0)
MCV: 82.7 fL (ref 80.0–100.0)
Monocytes Absolute: 0.5 10*3/uL (ref 0.1–1.0)
Monocytes Relative: 10 %
Neutro Abs: 3.9 10*3/uL (ref 1.7–7.7)
Neutrophils Relative %: 68 %
Platelets: 219 10*3/uL (ref 150–400)
RBC: 4.74 MIL/uL (ref 3.87–5.11)
RDW: 13.9 % (ref 11.5–15.5)
WBC: 5.7 10*3/uL (ref 4.0–10.5)
nRBC: 0 % (ref 0.0–0.2)

## 2018-12-25 LAB — COMPREHENSIVE METABOLIC PANEL
ALT: 16 U/L (ref 0–44)
AST: 19 U/L (ref 15–41)
Albumin: 4.4 g/dL (ref 3.5–5.0)
Alkaline Phosphatase: 101 U/L (ref 38–126)
Anion gap: 11 (ref 5–15)
BUN: 15 mg/dL (ref 8–23)
CO2: 26 mmol/L (ref 22–32)
Calcium: 9.5 mg/dL (ref 8.9–10.3)
Chloride: 102 mmol/L (ref 98–111)
Creatinine, Ser: 0.76 mg/dL (ref 0.44–1.00)
GFR calc Af Amer: >60 mL/min (ref >60)
GFR calc non Af Amer: >60 mL/min (ref >60)
Glucose, Bld: 146 mg/dL — ABNORMAL HIGH (ref 70–99)
Potassium: 2.9 mmol/L — ABNORMAL LOW (ref 3.5–5.1)
Sodium: 139 mmol/L (ref 135–145)
Total Bilirubin: 0.6 mg/dL (ref 0.3–1.2)
Total Protein: 7.3 g/dL (ref 6.5–8.1)

## 2018-12-25 NOTE — Progress Notes (Signed)
Julie [name]  Patient Care Team: [place] Pink, [name], [name], [name]  Cancer Staging No matching staging information was found for the patient.    Oncology History Overview [name]  1. Carcinoma of the breast, status post lumpectomy and sentinel lymph node biopsy. June 2007ER/PR negative, Her-2 negative. PT2N1(mic)M0, STAGE IIb 2. Completed 6 cycles Cytoxan/Taxotere October 2007. Right breast XRT. 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 4. Right mastectomy, May 22, 2015. Location of mass was at 9:00. All the margins were clear. 4 lymph nodes were negative for any tumor. Invasive mammary carcinoma with apocrine features. Margins are negative. Informal vascular invasion present, rpT2 pNO cMO (triple negative). --------------------------------------------   DIAGNOSIS: Breast cancer  STAGE:  II   ;GOALS: cure  CURRENT/MOST RECENT THERAPY : survielleince    Carcinoma of overlapping sites of right breast in female, estrogen receptor negative ([place])  04/05/2016 Initial Diagnosis   Cancer of overlapping sites of right female breast [facility])     INTERVAL HISTORY:  Julie [name] 78 y.o.  female pleasant patient above history of Breast cancer is here for follow-up.  Patient denies any new lumps or bumps.  Appetite is good.  No weight loss.  She complains of neck pain over the last day or so.  States that she might have slept in a different position.  She is started on Mobic and Flexeril by PCP.  She denies any headaches.  Denies any nausea vomiting.  Review of Systems  Constitutional: Negative for chills, diaphoresis, fever, malaise/fatigue and weight loss.  HENT: Negative for nosebleeds and sore throat.    Eyes: Negative for double vision.  Respiratory: Negative for cough, hemoptysis, sputum production, shortness of breath and wheezing.   Cardiovascular: Negative for chest pain, palpitations, orthopnea and leg swelling.  Gastrointestinal: Negative for abdominal pain, blood in stool, constipation, diarrhea, heartburn, melena, nausea and vomiting.  Genitourinary: Negative for dysuria, frequency and urgency.  Musculoskeletal: Positive for neck pain. Negative for back pain and joint pain.  Skin: Negative.  Negative for itching and rash.  Neurological: Negative for dizziness, tingling, focal weakness, weakness and headaches.  Endo/Heme/Allergies: Does not bruise/bleed easily.  Psychiatric/Behavioral: Negative for depression. The patient is not nervous/anxious and does not have insomnia.      PAST MEDICAL HISTORY :  Past Medical History:  Diagnosis Date  . Anemia   . Arthritis   . Breast cancer ([place]) 2007   right breast cancer  . Breast cancer ([place]) 2016   recurrent right breast cancer, triple negative  . Cancer [facility]) 2007   Right breast  . Cancer of breast ([place]) 04/30/2015  . Diabetes mellitus without complication ([place])   . GERD (gastroesophageal reflux disease)   . Hypertension   . Personal history of malignant neoplasm of breast   . Pre-diabetes   . Shortness of breath dyspnea     PAST SURGICAL HISTORY :   Past Surgical History:  Procedure Laterality Date  . ABDOMINAL HYSTERECTOMY    . APPENDECTOMY    . BREAST BIOPSY Right 2007 and 2016   positive  . BREAST EXCISIONAL BIOPSY Left    neg  . BREAST LUMPECTOMY Right 2007  . CATARACT EXTRACTION, BILATERAL Bilateral   . COLONOSCOPY WITH PROPOFOL N/A 06/14/2018  Procedure: COLONOSCOPY WITH PROPOFOL;  Surgeon: Toledo, Benay Pike, MD;  Location: [facility] ENDOSCOPY;  Service: Gastroenterology;  Laterality: N/A;  . JOINT REPLACEMENT Left 2011   knee  . JOINT REPLACEMENT Right 12/2011   knee  . KNEE SURGERY Right 03/2012  . MASTECTOMY  MODIFIED RADICAL Right 05/22/2015   Procedure: MASTECTOMY MODIFIED RADICAL;  Surgeon: Christene Lye, MD;  Location: [facility] ORS;  Service: General;  Laterality: Right;    FAMILY HISTORY :   Family History  Problem Relation Age of Onset  . Breast cancer Mother     SOCIAL HISTORY:   Social History   Tobacco Use  . Smoking status: Former Smoker    Packs/day: 0.75    Years: 2.00    Pack years: 1.50    Types: Cigarettes    Quit date: 05/15/1980    Years since quitting: 38.6  . Smokeless tobacco: Never Used  Substance Use Topics  . Alcohol use: No  . Drug use: No    ALLERGIES:  is allergic to gabapentin and penicillin g.  MEDICATIONS:  Current Outpatient Medications  Medication Sig Dispense Refill  . amLODipine (NORVASC) 10 MG tablet Take 1 tablet by mouth daily.    . cyanocobalamin (,VITAMIN B-12,) 1000 MCG/ML injection Inject 1,000 mcg into the muscle every 30 (thirty) days.    . cyclobenzaprine (FLEXERIL) 5 MG tablet Take 1 tablet by mouth daily.    Marland Kitchen KLOR-CON M20 20 MEQ tablet Take 60 mEq by mouth once.     . meloxicam (MOBIC) 15 MG tablet Take 1 tablet by mouth daily.    . simvastatin (ZOCOR) 20 MG tablet Take 20 mg by mouth daily at 6 PM.     . triamterene-hydrochlorothiazide (MAXZIDE-25) 37.5-25 MG per tablet Take 1 tablet by mouth daily.      No current facility-administered medications for this visit.     PHYSICAL EXAMINATION: ECOG PERFORMANCE STATUS: 0 - Asymptomatic  BP (!) 142/74   Pulse 80   Temp (!) 97.3 F (36.3 C) (Tympanic)   Resp 20   Ht _0  (1.626 m)   Wt 167 lb (75.8 kg)   BMI 28.67 kg/m   Filed Weights   12/25/18 1338  Weight: 167 lb (75.8 kg)    Physical Exam  Constitutional: She is oriented to person, place, and time and well-developed, well-nourished, and in no distress.  HENT:  Head: Normocephalic and atraumatic.  Mouth/Throat: Oropharynx is clear and moist. No oropharyngeal exudate.  Eyes: Pupils are equal, round, and  reactive to light.  Neck: Normal range of motion. Neck supple.  Cardiovascular: Normal rate and regular rhythm.  Pulmonary/Chest: No respiratory distress. She has no wheezes.  Abdominal: Soft. Bowel sounds are normal. She exhibits no distension and no mass. There is no abdominal tenderness. There is no rebound and no guarding.  Musculoskeletal: Normal range of motion.        General: No tenderness or edema.  Neurological: She is alert and oriented to person, place, and time.  Skin: Skin is warm.  Psychiatric: Affect normal.      LABORATORY DATA:  I have reviewed the data as listed    Component Value Date/Time   NA 139 12/25/2018 1319   NA 138 04/25/2015 1210   NA 141 03/15/2012 1210   K 2.9 (L) 12/25/2018 1319   K 3.2 (L) 03/21/2012 1256   CL 102 12/25/2018 1319   CL 102 03/15/2012 1210   CO2 26 12/25/2018 1319   CO2 33 (H) 03/15/2012  1210   GLUCOSE 146 (H) 12/25/2018 1319   GLUCOSE 113 (H) 03/15/2012 1210   BUN 15 12/25/2018 1319   BUN 15 04/25/2015 1210   BUN 10 03/15/2012 1210   CREATININE 0.76 12/25/2018 1319   CREATININE 0.75 03/15/2012 1210   CALCIUM 9.5 12/25/2018 1319   CALCIUM 9.9 03/15/2012 1210   PROT 7.3 12/25/2018 1319   PROT 7.1 04/25/2015 1210   ALBUMIN 4.4 12/25/2018 1319   ALBUMIN 4.7 04/25/2015 1210   AST 19 12/25/2018 1319   ALT 16 12/25/2018 1319   ALKPHOS 101 12/25/2018 1319   BILITOT 0.6 12/25/2018 1319   BILITOT 0.3 04/25/2015 1210   GFRNONAA >60 12/25/2018 1319   GFRNONAA >60 03/15/2012 1210   GFRAA >60 12/25/2018 1319   GFRAA >60 03/15/2012 1210    No results found for: SPEP, UPEP  Lab Results  Component Value Date   WBC 5.7 12/25/2018   NEUTROABS 3.9 12/25/2018   HGB 12.4 12/25/2018   HCT 39.2 12/25/2018   MCV 82.7 12/25/2018   PLT 219 12/25/2018      Chemistry      Component Value Date/Time   NA 139 12/25/2018 1319   NA 138 04/25/2015 1210   NA 141 03/15/2012 1210   K 2.9 (L) 12/25/2018 1319   K 3.2 (L) 03/21/2012 1256    CL 102 12/25/2018 1319   CL 102 03/15/2012 1210   CO2 26 12/25/2018 1319   CO2 33 (H) 03/15/2012 1210   BUN 15 12/25/2018 1319   BUN 15 04/25/2015 1210   BUN 10 03/15/2012 1210   CREATININE 0.76 12/25/2018 1319   CREATININE 0.75 03/15/2012 1210      Component Value Date/Time   CALCIUM 9.5 12/25/2018 1319   CALCIUM 9.9 03/15/2012 1210   ALKPHOS 101 12/25/2018 1319   AST 19 12/25/2018 1319   ALT 16 12/25/2018 1319   BILITOT 0.6 12/25/2018 1319   BILITOT 0.3 04/25/2015 1210       RADIOGRAPHIC STUDIES: I have personally reviewed the radiological images as listed and agreed with the findings in the report. No results found.   ASSESSMENT & PLAN:  Carcinoma of overlapping sites of right breast in female, estrogen receptor negative ([place]) # Right breast ipsilateral breast recurrence triple negative [Nov 2016] s/p Lumpectomy; no adjuvant chemotherapy.  No evidence of recurrence.  Stable  #Left breast calcifications -followed by diagnostic mammogram December 2019 ; awaiting repeat mammogram this June 2020.  Discussed with Dr. Tollie Pizza.  His office will arrange follow-up mammogram.  # neck pain- ? Postural- on mobic/ felexil.  Monitor closely if not improved recommend call us for further work-up.  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 Prostate cancer at 49y; mother- died of breast cancer at 26y-awaiting  genetic cousnelling  # severe Hypokalemia- 2.9; worse; seen by PCP this AM; continue follow up with PCP.   # DISPOSITION: # follow-up in 6 months with labs-MD-cbc/cmp/ca-27-29-Dr.B.   CC: Dr.Hedrick, [place]/ Dr.Brynett.    Orders Placed This Encounter  Procedures  . CBC with Differential    Standing Status:   Future    Standing Expiration Date:   12/25/2019  . Comprehensive metabolic panel    Standing Status:   Future    Standing Expiration Date:   12/25/2019  . Cancer antigen 27.29    Standing Status:   Future    Standing  Expiration Date:   12/25/2019   All questions were answered. The patient knows  to call the clinic with any problems, questions or concerns.      Cammie Sickle, MD 12/25/2018 5:24 PM

## 2018-12-25 NOTE — Assessment & Plan Note (Addendum)
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  No evidence of recurrence.  Stable  #Left breast calcifications -followed by diagnostic mammogram December 2019 ; awaiting repeat mammogram this June 2020.  Discussed with Dr. Tollie Pizza.  His office will arrange follow-up mammogram.  # neck pain- ? Postural- on mobic/ felexil.  Monitor closely if not improved recommend call us for further work-up.  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 Prostate cancer at 32y; mother- died of breast cancer at 55y-awaiting  genetic cousnelling  # severe Hypokalemia- 2.9; worse; seen by PCP this AM; continue follow up with PCP.   # DISPOSITION: # follow-up in 6 months with labs-MD-cbc/cmp/ca-27-29-Dr.B.   CC: Dr.Hedrick, [place]/ Dr.Brynett.

## 2018-12-26 LAB — CANCER ANTIGEN 27.29: CA 27.29: 9.9 U/mL (ref 0.0–38.6)

## 2019-01-02 ENCOUNTER — Ambulatory Visit
Admission: RE | Admit: 2019-01-02 | Discharge: 2019-01-02 | Disposition: A | Discharge status: Home / Self Care | Payer: Medicare HMO | Source: Ambulatory Visit | Attending: General Surgery | Admitting: General Surgery

## 2019-01-02 ENCOUNTER — Other Ambulatory Visit: Payer: Self-pay

## 2019-01-02 DIAGNOSIS — R920 Mammographic microcalcification found on diagnostic imaging of breast: Secondary | ICD-10-CM

## 2019-01-09 ENCOUNTER — Ambulatory Visit: Payer: Medicare HMO | Admitting: General Surgery

## 2019-01-09 ENCOUNTER — Other Ambulatory Visit: Payer: Self-pay

## 2019-01-09 ENCOUNTER — Encounter: Payer: Self-pay | Admitting: General Surgery

## 2019-01-09 VITALS — BP 140/68 | Temp 97.7°F | Resp 18 | Ht 62.0 in | Wt 167.0 lb

## 2019-01-09 DIAGNOSIS — R920 Mammographic microcalcification found on diagnostic imaging of breast: Secondary | ICD-10-CM

## 2019-01-09 DIAGNOSIS — C801 Malignant (primary) neoplasm, unspecified: Secondary | ICD-10-CM | POA: Insufficient documentation

## 2019-01-09 NOTE — Progress Notes (Signed)
Patient ID: Julie [name], female   DOB: Jan 21, 1941, 78 y.o.   MRN: [id number]  Chief Complaint  Patient presents with  . Follow-up    Left diagnostic mammogram    HPI Julie [name] is a 78 y.o. female.  Here today for follow up left diagnostic mammogram for previously identified microcalcification. No complaints.  The patient needs a new prescription for her breast prosthesis and surgical bra. HPI  Past Medical History:  Diagnosis Date  . Anemia   . Arthritis   . Breast cancer ([place]) 2007   right breast cancer  . Breast cancer ([place]) 2016   recurrent right breast cancer, triple negative  . Cancer [facility]) 2007   Right breast  . Cancer of breast ([place]) 04/30/2015  . Diabetes mellitus without complication ([place])   . GERD (gastroesophageal reflux disease)   . Hypertension   . Personal history of malignant neoplasm of breast   . Pre-diabetes   . Shortness of breath dyspnea     Past Surgical History:  Procedure Laterality Date  . ABDOMINAL HYSTERECTOMY    . APPENDECTOMY    . BREAST BIOPSY Right 2007 and 2016   positive  . BREAST EXCISIONAL BIOPSY Left    neg  . BREAST LUMPECTOMY Right 2007  . CATARACT EXTRACTION, BILATERAL Bilateral   . COLONOSCOPY WITH PROPOFOL N/A 06/14/2018   Procedure: COLONOSCOPY WITH PROPOFOL;  Surgeon: Toledo, Benay Pike, MD;  Location: [facility] ENDOSCOPY;  Service: Gastroenterology;  Laterality: N/A;  . JOINT REPLACEMENT Left 2011   knee  . JOINT REPLACEMENT Right 12/2011   knee  . KNEE SURGERY Right 03/2012  . MASTECTOMY    . MASTECTOMY MODIFIED RADICAL Right 05/22/2015   Procedure: MASTECTOMY MODIFIED RADICAL;  Surgeon: Christene Lye, MD;  Location: [facility] ORS;  Service: General;  Laterality: Right;    Family History  Problem Relation Age of Onset  . Breast cancer Mother     Social History Social History   Tobacco Use  . Smoking status: Former Smoker    Packs/day: 0.75    Years: 2.00    Pack years: 1.50    Types: Cigarettes    Quit  date: 05/15/1980    Years since quitting: 38.6  . Smokeless tobacco: Never Used  Substance Use Topics  . Alcohol use: No  . Drug use: No    Allergies  Allergen Reactions  . Gabapentin Other (See Comments)    dizziness  . Penicillin G Rash    Current Outpatient Medications  Medication Sig Dispense Refill  . amLODipine (NORVASC) 10 MG tablet Take 1 tablet by mouth daily.    . cyanocobalamin (,VITAMIN B-12,) 1000 MCG/ML injection Inject 1,000 mcg into the muscle every 30 (thirty) days.    . cyclobenzaprine (FLEXERIL) 5 MG tablet Take 1 tablet by mouth daily.    Marland Kitchen KLOR-CON M20 20 MEQ tablet Take 60 mEq by mouth once.     . meloxicam (MOBIC) 15 MG tablet Take 1 tablet by mouth daily.    . simvastatin (ZOCOR) 20 MG tablet Take 20 mg by mouth daily at 6 PM.     . triamterene-hydrochlorothiazide (MAXZIDE-25) 37.5-25 MG per tablet Take 1 tablet by mouth daily.      No current facility-administered medications for this visit.     Review of Systems Review of Systems  Constitutional: Negative.   Respiratory: Negative.   Cardiovascular: Negative.     Blood pressure 140/68, temperature 97.7 F (36.5 C), temperature source Temporal, resp. rate 18, height  5\' 2"  (1.575 m), weight 167 lb (75.8 kg), SpO2 96 %.  Physical Exam Physical Exam Exam conducted with a chaperone present.  Constitutional:      Appearance: Normal appearance. She is well-developed.  Eyes:     Conjunctiva/sclera: Conjunctivae normal.  Neck:     Musculoskeletal: Normal range of motion.  Cardiovascular:     Rate and Rhythm: Normal rate and regular rhythm.     Heart sounds: Normal heart sounds.  Pulmonary:     Effort: Pulmonary effort is normal.     Breath sounds: Normal breath sounds.  Chest:     Breasts:        Right: Absent.        Left: Normal.    Lymphadenopathy:     Upper Body:     Right upper body: No supraclavicular or axillary adenopathy.     Left upper body: No axillary adenopathy.  Skin:     General: Skin is warm and dry.  Neurological:     Mental Status: She is alert and oriented to person, place, and time.     Data Reviewed Left breast diagnostic mammogram of January 02, 2019 was independently reviewed.  Stable microcalcifications.  BI-RADS 3.  Assessment Stable left breast microcalcifications.  Six-month follow-up recommended.  Plan We will contact you in 6 months December 2020 to schedule your follow up mammogram.   A new prescription for her breast prosthesis and surgical bras x6 was provided.  HPI, Physical Exam, Assessment and Plan have been scribed under the direction and in the presence of Robert Bellow, MD. Jonnie Finner, CMA  I have completed the exam and reviewed the above documentation for accuracy and completeness.  I agree with the above.  Haematologist has been used and any errors in dictation or transcription are unintentional.  Hervey Ard, M.D., F.A.C.S.  Forest Gleason Cong Hightower 01/10/2019, 8:45 PM

## 2019-01-09 NOTE — Patient Instructions (Addendum)
We will contact you in 6 months December 2020 to schedule your follow up mammogram.

## 2019-02-02 ENCOUNTER — Encounter: Payer: Self-pay | Admitting: General Surgery

## 2019-03-05 ENCOUNTER — Telehealth: Payer: Self-pay | Admitting: Licensed Clinical Social Worker

## 2019-03-06 ENCOUNTER — Telehealth: Payer: Self-pay

## 2019-03-06 NOTE — Telephone Encounter (Signed)
[facility] Mastectomy bra and bra prosthesis at this time.

## 2019-03-07 ENCOUNTER — Other Ambulatory Visit: Payer: Self-pay

## 2019-03-07 DIAGNOSIS — C50811 Malignant neoplasm of overlapping sites of right female breast: Secondary | ICD-10-CM

## 2019-03-07 NOTE — Telephone Encounter (Signed)
Attempt to call Julie [name] about upcoming genetics appt, left voicemail.

## 2019-03-08 ENCOUNTER — Other Ambulatory Visit: Payer: Self-pay

## 2019-03-08 ENCOUNTER — Encounter: Payer: Self-pay | Admitting: Licensed Clinical Social Worker

## 2019-03-08 ENCOUNTER — Inpatient Hospital Stay: Payer: Medicare HMO | Attending: Internal Medicine | Admitting: Licensed Clinical Social Worker

## 2019-03-08 ENCOUNTER — Inpatient Hospital Stay: Payer: Medicare HMO

## 2019-03-08 DIAGNOSIS — C50811 Malignant neoplasm of overlapping sites of right female breast: Secondary | ICD-10-CM

## 2019-03-08 DIAGNOSIS — Z8042 Family history of malignant neoplasm of prostate: Secondary | ICD-10-CM | POA: Diagnosis not present

## 2019-03-08 DIAGNOSIS — Z803 Family history of malignant neoplasm of breast: Secondary | ICD-10-CM | POA: Diagnosis not present

## 2019-03-08 DIAGNOSIS — Z171 Estrogen receptor negative status [ER-]: Secondary | ICD-10-CM

## 2019-03-08 NOTE — Progress Notes (Signed)
REFERRING PROVIDER: Cammie Sickle, MD [place],  [place] [address]  PRIMARY PROVIDER:  [place] Pink, MD  PRIMARY REASON FOR VISIT:  1. Carcinoma of overlapping sites of right breast in female, estrogen receptor negative ([place])   2. Family history of breast cancer   3. Family history of prostate cancer      HISTORY OF PRESENT ILLNESS:   Julie [name], a 78 y.o. female, was seen for a [facility] cancer genetics consultation at the request of Dr. Rogue Bussing due to a personal and family history of cancer.  Julie [name] presents to clinic today to discuss the possibility of a hereditary predisposition to cancer, genetic testing, and to further clarify her future cancer risks, as well as potential cancer risks for family members.   At the age of 30, Julie [name] was diagnosed with IDC of the right breast, triple negative. This was treated with lumpectomy, chemotherapy and radiation. At the age of 89, she had a recurrence of this cancer and had a right mastectomy.   CANCER HISTORY:  Oncology History Overview Note  1. Carcinoma of the breast, status post lumpectomy and sentinel lymph node biopsy. June 2007ER/PR negative, Her-2 negative. PT2N1(mic)M0, STAGE IIb 2. Completed 6 cycles Cytoxan/Taxotere October 2007. Right breast XRT. 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 4. Right mastectomy, May 22, 2015. Location of mass was at 9:00. All the margins were clear. 4 lymph nodes were negative for any tumor. Invasive mammary carcinoma with apocrine features. Margins are negative. Informal vascular invasion present, rpT2 pNO cMO (triple negative). --------------------------------------------   DIAGNOSIS: Breast cancer  STAGE:  II   ;GOALS: cure  CURRENT/MOST RECENT THERAPY : survielleince    Carcinoma of overlapping sites of right breast in female, estrogen  receptor negative ([place])  04/05/2016 Initial Diagnosis   Cancer of overlapping sites of right female breast ([place])      RISK FACTORS:  Menarche was at age 39.  First live birth at age 28.  OCP use for approximately 5 years.  Ovaries intact: Yes  Hysterectomy: Yes Menopausal status: postmenopausal Colonoscopy: Yes, polyps  Mammogram within last year: yes, 12/2018   Past Medical History:  Diagnosis Date  . Anemia   . Arthritis   . Breast cancer ([place]) 2007   right breast cancer  . Breast cancer ([place]) 2016   recurrent right breast cancer, triple negative  . Cancer [facility]) 2007   Right breast  . Cancer of breast ([place]) 04/30/2015  . Diabetes mellitus without complication ([place])   . Family history of breast cancer   . Family history of prostate cancer   . GERD (gastroesophageal reflux disease)   . Hypertension   . Personal history of malignant neoplasm of breast   . Pre-diabetes   . Shortness of breath dyspnea     Past Surgical History:  Procedure Laterality Date  . ABDOMINAL HYSTERECTOMY    . APPENDECTOMY    . BREAST BIOPSY Right 2007 and 2016   positive  . BREAST EXCISIONAL BIOPSY Left    neg  . BREAST LUMPECTOMY Right 2007  . CATARACT EXTRACTION, BILATERAL Bilateral   . COLONOSCOPY WITH PROPOFOL N/A 06/14/2018   Procedure: COLONOSCOPY WITH PROPOFOL;  Surgeon: Toledo, Benay Pike, MD;  Location: [facility] ENDOSCOPY;  Service: Gastroenterology;  Laterality: N/A;  . JOINT REPLACEMENT Left 2011   knee  . JOINT REPLACEMENT Right 12/2011  knee  . KNEE SURGERY Right 03/2012  . MASTECTOMY    . MASTECTOMY MODIFIED RADICAL Right 05/22/2015   Procedure: MASTECTOMY MODIFIED RADICAL;  Surgeon: Christene Lye, MD;  Location: [facility] ORS;  Service: General;  Laterality: Right;    Social History   Socioeconomic History  . Marital status: Married    Spouse name: Not on file  . Number of children: Not on file  . Years of education: Not on file  . Highest education level: Not on file   Occupational History  . Not on file  Social Needs  . Financial resource strain: Not on file  . Food insecurity    Worry: Not on file    Inability: Not on file  . Transportation needs    Medical: Not on file    Non-medical: Not on file  Tobacco Use  . Smoking status: Former Smoker    Packs/day: 0.75    Years: 2.00    Pack years: 1.50    Types: Cigarettes    Quit date: 05/15/1980    Years since quitting: 38.8  . Smokeless tobacco: Never Used  Substance and Sexual Activity  . Alcohol use: No  . Drug use: No  . Sexual activity: Not on file  Lifestyle  . Physical activity    Days per week: Not on file    Minutes per session: Not on file  . Stress: Not on file  Relationships  . Social Herbalist on phone: Not on file    Gets together: Not on file    Attends religious service: Not on file    Active member of club or organization: Not on file    Attends meetings of clubs or organizations: Not on file    Relationship status: Not on file  Other Topics Concern  . Not on file  Social History Narrative  . Not on file     FAMILY HISTORY:  We obtained a detailed, 4-generation family history.  Significant diagnoses are listed below: Family History  Problem Relation Age of Onset  . Breast cancer Mother        dx late 43s  . Heart attack Father   . Heart attack Sister 49  . Prostate cancer Brother        dx 49s  . Dementia Paternal Grandfather     Julie [name] has one daughter, 34, and one son, 63. No cancers. She has 4 brothers and 1 sister. Her sister died at 37 of a heart attack. One of her brothers recently passed from prostate cancer at 15. Her other brothers are living. No cancers in her nieces/nephews.  Julie [name] mother was diagnosed with breast cancer in her 56s and died at 15. Patient had 3 maternal aunts, all are deceased, no known cancers. No known cancers in maternal cousins. Her maternal grandmother died in her 2s, she does not have information about her  maternal grandfather.   Julie [name] father died in his 43s due to a heart attack, no history of cancer. Patient had 5 paternal uncles, 1 paternal aunt, no known cancers. No known cancers in paternal cousins. Her paternal grandmother died at 66, grandfather died due to dementia.   Ms. Reimann is unaware of previous family history of genetic testing for hereditary cancer risks.  There is no reported Ashkenazi Jewish ancestry. There is no known consanguinity.  GENETIC COUNSELING ASSESSMENT: Ms. Mcclintic is a 78 y.o. female with a personal and family history which is somewhat suggestive  of a hereditary cancer syndrome and predisposition to cancer. We, therefore, discussed and recommended the following at today's visit.   DISCUSSION: We discussed that 5 - 10% of breast cancer is hereditary, with most cases associated with BRCA1/BRCA2 mutations.  There are other genes that can be associated with hereditary breast cancer syndromes.  These include PALB2, CHEK2, ATM.   We discussed that testing is beneficial for several reasons including knowing how to follow individuals for their cancer screenings, and understand if other family members could be at risk for cancer and allow them to undergo genetic testing.   We reviewed the characteristics, features and inheritance patterns of hereditary cancer syndromes. We also discussed genetic testing, including the appropriate family members to test, the process of testing, insurance coverage and turn-around-time for results. We discussed the implications of a negative, positive and/or variant of uncertain significant result. We recommended Ms. Korber pursue genetic testing for the Common Hereditary Cancers Panel gene panel.   The Common Hereditary Cancers Panel offered by [facility] includes sequencing and/or deletion duplication testing of the following 48 genes: APC, ATM, AXIN2, BARD1, BMPR1A, BRCA1, BRCA2, BRIP1, CDH1, CDKN2A (p14ARF), CDKN2A (p16INK4a), CKD4, CHEK2, CTNNA1,  DICER1, EPCAM (Deletion/duplication testing only), GREM1 (promoter region deletion/duplication testing only), KIT, MEN1, MLH1, MSH2, MSH3, MSH6, MUTYH, NBN, NF1, NHTL1, PALB2, PDGFRA, PMS2, POLD1, POLE, PTEN, RAD50, RAD51C, RAD51D, RNF43, SDHB, SDHC, SDHD, SMAD4, SMARCA4. STK11, TP53, TSC1, TSC2, and VHL.  The following genes were evaluated for sequence changes only: SDHA and HOXB13 c.251G>A variant only.  Based on Ms. Selbe's personal and family history of cancer, she does not quite meet NCCN criteria for genetic testing, but she is close to meeting and still wanted testing. She may have an out of pocket cost.   PLAN: After considering the risks, benefits, and limitations, Ms. Rochefort provided informed consent to pursue genetic testing and the blood sample was sent to [facility] for analysis of the Common Hereditary Cancers Panel. Results should be available within approximately 2-3 weeks' time, at which point they will be disclosed by telephone to Ms. Dekay, as will any additional recommendations warranted by these results. Ms. Huberty will receive a summary of her genetic counseling visit and a copy of her results once available. This information will also be available in Epic.   Lastly, we encouraged Ms. Kasprzak to remain in contact with cancer genetics annually so that we can continuously update the family history and inform her of any changes in cancer genetics and testing that may be of benefit for this family.   Ms. Crewe questions were answered to her satisfaction today. Our contact information was provided should additional questions or concerns arise. Thank you for the referral and allowing [place] to share in the care of your patient.   Faith Rogue, MS, [facility] Genetic Counselor [email or web address] Phone: [phone]  The patient was seen for a total of 30 minutes in face-to-face genetic counseling.  Dr. Grayland Ormond was available for discussion regarding this case.    _______________________________________________________________________ For Office Staff:  Number of people involved in session: 1 Was an Intern/ student involved with case: no

## 2019-03-23 ENCOUNTER — Ambulatory Visit: Payer: Self-pay | Admitting: Licensed Clinical Social Worker

## 2019-03-23 ENCOUNTER — Encounter: Payer: Self-pay | Admitting: Licensed Clinical Social Worker

## 2019-03-23 ENCOUNTER — Telehealth: Payer: Self-pay | Admitting: Licensed Clinical Social Worker

## 2019-03-23 DIAGNOSIS — C50811 Malignant neoplasm of overlapping sites of right female breast: Secondary | ICD-10-CM

## 2019-03-23 DIAGNOSIS — Z1379 Encounter for other screening for genetic and chromosomal anomalies: Secondary | ICD-10-CM

## 2019-03-23 DIAGNOSIS — Z803 Family history of malignant neoplasm of breast: Secondary | ICD-10-CM

## 2019-03-23 DIAGNOSIS — Z8042 Family history of malignant neoplasm of prostate: Secondary | ICD-10-CM

## 2019-03-23 NOTE — Progress Notes (Signed)
HPI:  [name] was previously seen in the [facility] clinic due to a personal and family history of cancer and concerns regarding a hereditary predisposition to cancer. Please refer to our prior cancer genetics clinic note for more information regarding our discussion, assessment and recommendations, at the time. [name] recent genetic test results were disclosed to her, as were recommendations warranted by these results. These results and recommendations are discussed in more detail below.  CANCER HISTORY:  Oncology History Overview Note  1. Carcinoma of the breast, status post lumpectomy and sentinel lymph node biopsy. June 2007ER/PR negative, Her-2 negative. PT2N1(mic)M0, STAGE IIb 2. Completed 6 cycles Cytoxan/Taxotere October 2007. Right breast XRT. 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 4. Right mastectomy, May 22, 2015. Location of mass was at 9:00. All the margins were clear. 4 lymph nodes were negative for any tumor. Invasive mammary carcinoma with apocrine features. Margins are negative. Informal vascular invasion present, rpT2 pNO cMO (triple negative). --------------------------------------------   DIAGNOSIS: Breast cancer  STAGE:  II   ;GOALS: cure  CURRENT/MOST RECENT THERAPY : survielleince    Carcinoma of overlapping sites of right breast in female, estrogen receptor negative ([place])  04/05/2016 Initial Diagnosis   Cancer of overlapping sites of right female breast [facility])     FAMILY HISTORY:  We obtained a detailed, 4-generation family history.  Significant diagnoses are listed below: Family History  Problem Relation Age of Onset   Breast cancer Mother        dx late 25s   Heart attack Father    Heart attack Sister 17   Prostate cancer Brother        dx 32s   Dementia Paternal Grandfather        [name] has one  daughter, 63, and one son, 45. No cancers. She has 4 brothers and 1 sister. Her sister died at 67 of a heart attack. One of her brothers recently passed from prostate cancer at 17. Her other brothers are living. No cancers in her nieces/nephews.  [name] mother was diagnosed with breast cancer in her 49s and died at 29. Patient had 3 maternal aunts, all are deceased, no known cancers. No known cancers in maternal cousins. Her maternal grandmother died in her 78s, she does not have information about her maternal grandfather.   [name] father died in his 18s due to a heart attack, no history of cancer. Patient had 5 paternal uncles, 1 paternal aunt, no known cancers. No known cancers in paternal cousins. Her paternal grandmother died at 47, grandfather died due to dementia.   [name] is unaware of previous family history of genetic testing for hereditary cancer risks.  There is no reported Ashkenazi Jewish ancestry. There is no known consanguinity.   GENETIC TEST RESULTS: Genetic testing reported out on 03/21/2019 through the [facility] Common Hereditary  cancer panel found no pathogenic mutations. The Common Hereditary Cancers Panel offered by [facility] includes sequencing and/or deletion duplication testing of the following 48 genes: APC, ATM, AXIN2, BARD1, BMPR1A, BRCA1, BRCA2, BRIP1, CDH1, CDKN2A (p14ARF), CDKN2A (p16INK4a), CKD4, CHEK2, CTNNA1, DICER1, EPCAM (Deletion/duplication testing only), GREM1 (promoter region deletion/duplication testing only), KIT, MEN1, MLH1, MSH2, MSH3, MSH6, MUTYH, NBN, NF1, NHTL1, PALB2, PDGFRA, PMS2, POLD1, POLE, PTEN, RAD50, RAD51C, RAD51D, RNF43, SDHB, SDHC, SDHD, SMAD4, SMARCA4. STK11, TP53, TSC1, TSC2, and VHL.  The following genes  were evaluated for sequence changes only: SDHA and HOXB13 c.251G>A variant only. The test report has been scanned into EPIC and is located under the Molecular Pathology section of the Results Review tab.  A portion of the result report  is included below for reference.     We discussed with [name] that because current genetic testing is not perfect, it is possible there may be a gene mutation in one of these genes that current testing cannot detect, but that chance is small.  We also discussed, that there could be another gene that has not yet been discovered, or that we have not yet tested, that is responsible for the cancer diagnoses in the family. It is also possible there is a hereditary cause for the cancer in the family that [name] did not inherit and therefore was not identified in her testing.  Therefore, it is important to remain in touch with cancer genetics in the future so that we can continue to offer [name] the most up to date genetic testing.   ADDITIONAL GENETIC TESTING: We discussed with [name] that her genetic testing was fairly extensive.  If there are genes identified to increase cancer risk that can be analyzed in the future, we would be happy to discuss and coordinate this testing at that time.    CANCER SCREENING RECOMMENDATIONS: [name] test result is considered negative (normal).  This means that we have not identified a hereditary cause for her personal and family history of cancer at this time. Most cancers happen by chance and this negative test suggests that her cancer may fall into this category.    While reassuring, this does not definitively rule out a hereditary predisposition to cancer. It is still possible that there could be genetic mutations that are undetectable by current technology. There could be genetic mutations in genes that have not been tested or identified to increase cancer risk.  Therefore, it is recommended she continue to follow the cancer management and screening guidelines provided by her oncology and primary healthcare provider.   An individual's cancer risk and medical management are not determined by genetic test results alone. Overall cancer risk assessment  incorporates additional factors, including personal medical history, family history, and any available genetic information that may result in a personalized plan for cancer prevention and surveillance.  RECOMMENDATIONS FOR FAMILY MEMBERS:  Relatives in this family might be at some increased risk of developing cancer, over the general population risk, simply due to the family history of cancer.  We recommended female relatives in this family have a yearly mammogram beginning at age 71, or 62 years younger than the earliest onset of cancer, an annual clinical breast exam, and perform monthly breast self-exams. Female relatives in this family should also have a gynecological exam as recommended by their primary provider. All family members should have a colonoscopy by age 48, or as directed by their physicians.  FOLLOW-UP: Lastly, we discussed with [name] that cancer genetics is a rapidly advancing field and it is possible that new genetic tests will be appropriate for her and/or her family members in the future. We encouraged her to remain in contact with cancer genetics on an annual basis so we can update her personal and family histories and let her know of advances in cancer genetics that may benefit this family.   Our contact number was provided. [name] questions were answered to her satisfaction, and she knows she is welcome to call us  at anytime with additional questions or concerns.   Faith Rogue, MS, [facility] Genetic Counselor [email or web address] Phone: [phone]

## 2019-03-23 NOTE — Telephone Encounter (Signed)
Revealed negative genetic testing.  This normal result is reassuring and indicates that it is unlikely Julie [name]'s cancer is due to a hereditary cause.  It is unlikely that there is an increased risk of another cancer due to a mutation in one of these genes.  However, genetic testing is not perfect, and cannot definitively rule out a hereditary cause.  It will be important for her to keep in contact with genetics to learn if any additional testing may be needed in the future.      

## 2019-03-26 NOTE — Progress Notes (Signed)
Opened in error

## 2019-05-03 ENCOUNTER — Other Ambulatory Visit: Payer: Self-pay

## 2019-05-03 DIAGNOSIS — R920 Mammographic microcalcification found on diagnostic imaging of breast: Secondary | ICD-10-CM

## 2019-05-23 ENCOUNTER — Telehealth: Payer: Self-pay

## 2019-05-23 NOTE — Telephone Encounter (Signed)
Patient called stating she received a letter from our office yesterday about her upcoming appointment for her mammogram. I informed the patient that she is scheduled with [facility] on December 14th at 2:20pm. Patient verbalizes understanding.

## 2019-06-25 ENCOUNTER — Ambulatory Visit
Admission: RE | Admit: 2019-06-25 | Discharge: 2019-06-25 | Disposition: A | Discharge status: Home / Self Care | Payer: Medicare HMO | Source: Ambulatory Visit | Attending: Surgery | Admitting: Surgery

## 2019-06-25 ENCOUNTER — Telehealth: Payer: Self-pay

## 2019-06-25 DIAGNOSIS — R920 Mammographic microcalcification found on diagnostic imaging of breast: Secondary | ICD-10-CM | POA: Insufficient documentation

## 2019-06-25 NOTE — Telephone Encounter (Signed)
-----   Message from Jules Husbands, MD sent at 06/25/2019  4:44 PM EST ----- Please let her know mammo did not show anything new , which is good. Repeat mammo in 1 year ----- Message ----- From: Interface, Rad Results In Sent: 06/25/2019   4:41 PM EST To: Jules Husbands, MD

## 2019-06-26 ENCOUNTER — Inpatient Hospital Stay: Payer: Medicare HMO | Admitting: Internal Medicine

## 2019-06-26 ENCOUNTER — Inpatient Hospital Stay: Payer: Medicare HMO | Attending: Internal Medicine

## 2019-06-26 NOTE — Telephone Encounter (Signed)
Per Dr.Pabon advised me to inform patient "Please let her know mammo did not show anything new , which is good. Repeat mammo in 1 year." Patient also notified of her follow up appointment on 07/04/19 at [place]. Patient notified and verbalized understanding.

## 2019-06-26 NOTE — Assessment & Plan Note (Deleted)
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  No evidence of recurrence.  Stable  #Left breast calcifications -followed by diagnostic mammogram December 2019 ; awaiting repeat mammogram this June 2020.  Discussed with Dr. Tollie Pizza.  His office will arrange follow-up mammogram.  # neck pain- ? Postural- on mobic/ felexil.  Monitor closely if not improved recommend call us for further work-up.  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 Prostate cancer at 63y; mother- died of breast cancer at 65y-awaiting  genetic cousnelling  # severe Hypokalemia- 2.9; worse; seen by PCP this AM; continue follow up with PCP.   # DISPOSITION: # follow-up in 6 months with labs-MD-cbc/cmp/ca-27-29-Dr.B.   CC: Dr.Hedrick, James/ Dr.Brynett.

## 2019-06-26 NOTE — Progress Notes (Deleted)
[facility] OFFICE PROGRESS NOTE  Patient Care Team: [place] Pink, [name], [name], [name]  Cancer Staging No matching staging information was found for the patient.    Oncology History Overview Note  1. Carcinoma of the breast, status post lumpectomy and sentinel lymph node biopsy. June 2007ER/PR negative, Her-2 negative. PT2N1(mic)M0, STAGE IIb 2. Completed 6 cycles Cytoxan/Taxotere October 2007. Right breast XRT. 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 4. Right mastectomy, May 22, 2015. Location of mass was at 9:00. All the margins were clear. 4 lymph nodes were negative for any tumor. Invasive mammary carcinoma with apocrine features. Margins are negative. Informal vascular invasion present, rpT2 pNO cMO (triple negative). --------------------------------------------   DIAGNOSIS: Breast cancer  STAGE:  II   ;GOALS: cure  CURRENT/MOST RECENT THERAPY : survielleince    Carcinoma of overlapping sites of right breast in female, estrogen receptor negative ([place])  04/05/2016 Initial Diagnosis   Cancer of overlapping sites of right female breast [facility])     INTERVAL HISTORY:  Julie [name] 78 y.o.  female pleasant patient above history of Breast cancer is here for follow-up.  Patient denies any new lumps or bumps.  Appetite is good.  No weight loss.  She complains of neck pain over the last day or so.  States that she might have slept in a different position.  She is started on Mobic and Flexeril by PCP.  She denies any headaches.  Denies any nausea vomiting.  Review of Systems  Constitutional: Negative for chills, diaphoresis, fever, malaise/fatigue and weight loss.  HENT: Negative for nosebleeds and sore throat.    Eyes: Negative for double vision.  Respiratory: Negative for cough, hemoptysis, sputum production, shortness of breath and wheezing.   Cardiovascular: Negative for chest pain, palpitations, orthopnea and leg swelling.  Gastrointestinal: Negative for abdominal pain, blood in stool, constipation, diarrhea, heartburn, melena, nausea and vomiting.  Genitourinary: Negative for dysuria, frequency and urgency.  Musculoskeletal: Positive for neck pain. Negative for back pain and joint pain.  Skin: Negative.  Negative for itching and rash.  Neurological: Negative for dizziness, tingling, focal weakness, weakness and headaches.  Endo/Heme/Allergies: Does not bruise/bleed easily.  Psychiatric/Behavioral: Negative for depression. The patient is not nervous/anxious and does not have insomnia.      PAST MEDICAL HISTORY :  Past Medical History:  Diagnosis Date  . Anemia   . Arthritis   . Breast cancer ([place]) 2007   right breast cancer  . Breast cancer ([place]) 2016   recurrent right breast cancer, triple negative  . Cancer [facility]) 2007   Right breast  . Cancer of breast ([place]) 04/30/2015  . Diabetes mellitus without complication ([place])   . Family history of breast cancer   . Family history of prostate cancer   . GERD (gastroesophageal reflux disease)   . Hypertension   . Personal history of malignant neoplasm of breast   . Pre-diabetes   . Shortness of breath dyspnea     PAST SURGICAL HISTORY :   Past Surgical History:  Procedure Laterality Date  . ABDOMINAL HYSTERECTOMY    . APPENDECTOMY    . BREAST BIOPSY Right 2007 and 2016   positive  . BREAST EXCISIONAL BIOPSY Left    neg  . BREAST LUMPECTOMY Right 2007  .  CATARACT EXTRACTION, BILATERAL Bilateral   . COLONOSCOPY WITH PROPOFOL N/A 06/14/2018   Procedure: COLONOSCOPY WITH PROPOFOL;  Surgeon: Toledo, Benay Pike, MD;  Location: [facility] ENDOSCOPY;  Service: Gastroenterology;  Laterality: N/A;  . JOINT REPLACEMENT Left 2011   knee  . JOINT  REPLACEMENT Right 12/2011   knee  . KNEE SURGERY Right 03/2012  . MASTECTOMY Right 2016  . MASTECTOMY MODIFIED RADICAL Right 05/22/2015   Procedure: MASTECTOMY MODIFIED RADICAL;  Surgeon: Christene Lye, MD;  Location: [facility] ORS;  Service: General;  Laterality: Right;    FAMILY HISTORY :   Family History  Problem Relation Age of Onset  . Breast cancer Mother        dx late 82s  . Heart attack Father   . Heart attack Sister 91  . Prostate cancer Brother        dx 48s  . Dementia Paternal Grandfather     SOCIAL HISTORY:   Social History   Tobacco Use  . Smoking status: Former Smoker    Packs/day: 0.75    Years: 2.00    Pack years: 1.50    Types: Cigarettes    Quit date: 05/15/1980    Years since quitting: 39.1  . Smokeless tobacco: Never Used  Substance Use Topics  . Alcohol use: No  . Drug use: No    ALLERGIES:  is allergic to gabapentin and penicillin g.  MEDICATIONS:  Current Outpatient Medications  Medication Sig Dispense Refill  . amLODipine (NORVASC) 10 MG tablet Take 1 tablet by mouth daily.    . cyanocobalamin (,VITAMIN B-12,) 1000 MCG/ML injection Inject 1,000 mcg into the muscle every 30 (thirty) days.    . cyclobenzaprine (FLEXERIL) 5 MG tablet Take 1 tablet by mouth daily.    Marland Kitchen KLOR-CON M20 20 MEQ tablet Take 60 mEq by mouth once.     . meloxicam (MOBIC) 15 MG tablet Take 1 tablet by mouth daily.    . simvastatin (ZOCOR) 20 MG tablet Take 20 mg by mouth daily at 6 PM.     . triamterene-hydrochlorothiazide (MAXZIDE-25) 37.5-25 MG per tablet Take 1 tablet by mouth daily.      No current facility-administered medications for this visit.    PHYSICAL EXAMINATION: ECOG PERFORMANCE STATUS: 0 - Asymptomatic  There were no vitals taken for this visit.  There were no vitals filed for this visit.  Physical Exam  Constitutional: She is oriented to person, place, and time and well-developed, well-nourished, and in no distress.  HENT:  Head: Normocephalic  and atraumatic.  Mouth/Throat: Oropharynx is clear and moist. No oropharyngeal exudate.  Eyes: Pupils are equal, round, and reactive to light.  Cardiovascular: Normal rate and regular rhythm.  Pulmonary/Chest: No respiratory distress. She has no wheezes.  Abdominal: Soft. Bowel sounds are normal. She exhibits no distension and no mass. There is no abdominal tenderness. There is no rebound and no guarding.  Musculoskeletal:        General: No tenderness or edema. Normal range of motion.     Cervical back: Normal range of motion and neck supple.  Neurological: She is alert and oriented to person, place, and time.  Skin: Skin is warm.  Psychiatric: Affect normal.      LABORATORY DATA:  I have reviewed the data as listed    Component Value Date/Time   NA 139 12/25/2018 1319   NA 138 04/25/2015 1210   NA 141 03/15/2012 1210   K 2.9 (L) 12/25/2018 1319   K 3.2 (L)  03/21/2012 1256   CL 102 12/25/2018 1319   CL 102 03/15/2012 1210   CO2 26 12/25/2018 1319   CO2 33 (H) 03/15/2012 1210   GLUCOSE 146 (H) 12/25/2018 1319   GLUCOSE 113 (H) 03/15/2012 1210   BUN 15 12/25/2018 1319   BUN 15 04/25/2015 1210   BUN 10 03/15/2012 1210   CREATININE 0.76 12/25/2018 1319   CREATININE 0.75 03/15/2012 1210   CALCIUM 9.5 12/25/2018 1319   CALCIUM 9.9 03/15/2012 1210   PROT 7.3 12/25/2018 1319   PROT 7.1 04/25/2015 1210   ALBUMIN 4.4 12/25/2018 1319   ALBUMIN 4.7 04/25/2015 1210   AST 19 12/25/2018 1319   ALT 16 12/25/2018 1319   ALKPHOS 101 12/25/2018 1319   BILITOT 0.6 12/25/2018 1319   BILITOT 0.3 04/25/2015 1210   GFRNONAA >60 12/25/2018 1319   GFRNONAA >60 03/15/2012 1210   GFRAA >60 12/25/2018 1319   GFRAA >60 03/15/2012 1210    No results found for: SPEP, UPEP  Lab Results  Component Value Date   WBC 5.7 12/25/2018   NEUTROABS 3.9 12/25/2018   HGB 12.4 12/25/2018   HCT 39.2 12/25/2018   MCV 82.7 12/25/2018   PLT 219 12/25/2018      Chemistry      Component Value  Date/Time   NA 139 12/25/2018 1319   NA 138 04/25/2015 1210   NA 141 03/15/2012 1210   K 2.9 (L) 12/25/2018 1319   K 3.2 (L) 03/21/2012 1256   CL 102 12/25/2018 1319   CL 102 03/15/2012 1210   CO2 26 12/25/2018 1319   CO2 33 (H) 03/15/2012 1210   BUN 15 12/25/2018 1319   BUN 15 04/25/2015 1210   BUN 10 03/15/2012 1210   CREATININE 0.76 12/25/2018 1319   CREATININE 0.75 03/15/2012 1210      Component Value Date/Time   CALCIUM 9.5 12/25/2018 1319   CALCIUM 9.9 03/15/2012 1210   ALKPHOS 101 12/25/2018 1319   AST 19 12/25/2018 1319   ALT 16 12/25/2018 1319   BILITOT 0.6 12/25/2018 1319   BILITOT 0.3 04/25/2015 1210       RADIOGRAPHIC STUDIES: I have personally reviewed the radiological images as listed and agreed with the findings in the report. MM DIAG BREAST TOMO UNI LEFT  Result Date: 06/25/2019 CLINICAL DATA:  Six-month follow-up for likely benign left breast calcifications. The patient has history of a right mastectomy. EXAM: DIGITAL DIAGNOSTIC UNILATERAL LEFT MAMMOGRAM WITH CAD AND TOMO COMPARISON:  Previous exam(s). ACR Breast Density Category c: The breast tissue is heterogeneously dense, which may obscure small masses. FINDINGS: Spot compression magnification images of the upper-outer left breast demonstrates stable coarse heterogeneous calcifications. No suspicious calcifications, masses or areas of distortion are seen in the left breast. Mammographic images were processed with CAD. IMPRESSION: 1. The likely benign left breast calcifications have demonstrated 1 year of stability. 2.  No evidence of malignancy in the left breast. 3.  History of right mastectomy. RECOMMENDATION: Diagnostic left breast mammogram in 1 year to document 2 years of stability of the left breast calcifications. I have discussed the findings and recommendations with the patient. If applicable, a reminder letter will be sent to the patient regarding the next appointment. BI-RADS CATEGORY  3: Probably  benign. Electronically Signed   By: Ammie Ferrier M.D.   On: 06/25/2019 14:41     ASSESSMENT & PLAN:  No problem-specific Assessment & Plan notes found for this encounter.   No orders of the defined types were  placed in this encounter.  All questions were answered. The patient knows to call the clinic with any problems, questions or concerns.      Cammie Sickle, MD 06/26/2019 7:36 AM

## 2019-07-02 ENCOUNTER — Ambulatory Visit: Payer: Medicare HMO | Admitting: Surgery

## 2019-07-04 ENCOUNTER — Other Ambulatory Visit: Payer: Self-pay

## 2019-07-04 ENCOUNTER — Ambulatory Visit: Payer: Medicare HMO | Admitting: Surgery

## 2019-07-04 ENCOUNTER — Encounter: Payer: Self-pay | Admitting: Surgery

## 2019-07-04 VITALS — Ht 62.0 in | Wt 159.7 lb

## 2019-07-04 DIAGNOSIS — Z171 Estrogen receptor negative status [ER-]: Secondary | ICD-10-CM

## 2019-07-04 DIAGNOSIS — R920 Mammographic microcalcification found on diagnostic imaging of breast: Secondary | ICD-10-CM

## 2019-07-04 DIAGNOSIS — C50811 Malignant neoplasm of overlapping sites of right female breast: Secondary | ICD-10-CM

## 2019-07-04 NOTE — Patient Instructions (Addendum)
Patient is advised to have a repeat mammogram in one year along with a physical exam.

## 2019-07-04 NOTE — Progress Notes (Signed)
Outpatient Surgical Follow Up  07/04/2019  Julie [name] is an 78 y.o. female.   No chief complaint on file.   HPI: Julie [name] is a 78 year old female with a history of right breast cancer in 2007 status post right mastectomy by Dr. Acquanetta Belling.  She is here for a physical exam and routine breast follow-up.  She denies any new complaints.  No fevers no chills no breast masses no nipple discharge.  She did have abnormal calcification of her left breast before and she is here for her 6 months close follow-up.  I have personally reviewed the mammogram showing stable calcification without any new concerning lesions.  She denies any weight loss or any other symptoms.  Past Medical History:  Diagnosis Date  . Anemia   . Arthritis   . Breast cancer ([place]) 2007   right breast cancer  . Breast cancer ([place]) 2016   recurrent right breast cancer, triple negative  . Cancer [facility]) 2007   Right breast  . Cancer of breast ([place]) 04/30/2015  . Diabetes mellitus without complication ([place])   . Family history of breast cancer   . Family history of prostate cancer   . GERD (gastroesophageal reflux disease)   . Hypertension   . Personal history of malignant neoplasm of breast   . Pre-diabetes   . Shortness of breath dyspnea     Past Surgical History:  Procedure Laterality Date  . ABDOMINAL HYSTERECTOMY    . APPENDECTOMY    . BREAST BIOPSY Right 2007 and 2016   positive  . BREAST EXCISIONAL BIOPSY Left    neg  . BREAST LUMPECTOMY Right 2007  . CATARACT EXTRACTION, BILATERAL Bilateral   . COLONOSCOPY WITH PROPOFOL N/A 06/14/2018   Procedure: COLONOSCOPY WITH PROPOFOL;  Surgeon: Toledo, Benay Pike, MD;  Location: [facility] ENDOSCOPY;  Service: Gastroenterology;  Laterality: N/A;  . JOINT REPLACEMENT Left 2011   knee  . JOINT REPLACEMENT Right 12/2011   knee  . KNEE SURGERY Right 03/2012  . MASTECTOMY Right 2016  . MASTECTOMY MODIFIED RADICAL Right 05/22/2015   Procedure: MASTECTOMY MODIFIED RADICAL;   Surgeon: Christene Lye, MD;  Location: [facility] ORS;  Service: General;  Laterality: Right;    Family History  Problem Relation Age of Onset  . Breast cancer Mother        dx late 48s  . Heart attack Father   . Heart attack Sister 51  . Prostate cancer Brother        dx 26s  . Dementia Paternal Grandfather     Social History:  reports that she quit smoking about 39 years ago. Her smoking use included cigarettes. She has a 1.50 pack-year smoking history. She has never used smokeless tobacco. She reports that she does not drink alcohol or use drugs.  Allergies:  Allergies  Allergen Reactions  . Gabapentin Other (See Comments)    dizziness  . Penicillin G Rash    Medications reviewed.    ROS Full ROS performed and is otherwise negative other than what is stated in HPI   There were no vitals taken for this visit.  Physical Exam Vitals and nursing note reviewed. Exam conducted with a chaperone present.  Constitutional:      Appearance: Normal appearance. She is normal weight.  Eyes:     General: No scleral icterus.       Right eye: No discharge.        Left eye: No discharge.  Cardiovascular:  Rate and Rhythm: Normal rate.     Pulses: Normal pulses.     Heart sounds: No murmur.  Pulmonary:     Effort: Pulmonary effort is normal. No respiratory distress.     Breath sounds: Normal breath sounds. No stridor.     Comments: BREAST: Right mastectomy scar, no new chest wall lesion, No masses on the left, no LAD Abdominal:     General: Abdomen is flat. There is no distension.     Palpations: There is no mass.     Tenderness: There is no abdominal tenderness.     Hernia: No hernia is present.  Musculoskeletal:     Cervical back: Normal range of motion and neck supple. No rigidity or tenderness.  Skin:    Capillary Refill: Capillary refill takes less than 2 seconds.  Neurological:     General: No focal deficit present.     Mental Status: She is alert and oriented  to person, place, and time.  Psychiatric:        Mood and Affect: Mood normal.        Thought Content: Thought content normal.        Judgment: Judgment normal.       Assessment/Plan: 78 year old female with a history of breast cancer and microcalcifications.  Currently there is no indication for further biopsies.  We will continue close follow-up of the microcalcifications.  Return to the office in 1 year with mammogram. Greater than 50% of the 25 minutes  visit was spent in counseling/coordination of care   Caroleen Hamman, MD [place] Surgeon

## 2019-10-24 ENCOUNTER — Other Ambulatory Visit: Payer: Self-pay

## 2019-10-24 ENCOUNTER — Inpatient Hospital Stay: Payer: Medicare HMO | Attending: Internal Medicine | Admitting: Internal Medicine

## 2019-10-24 ENCOUNTER — Encounter: Payer: Self-pay | Admitting: Internal Medicine

## 2019-10-24 ENCOUNTER — Inpatient Hospital Stay: Payer: Medicare HMO

## 2019-10-24 DIAGNOSIS — Z171 Estrogen receptor negative status [ER-]: Secondary | ICD-10-CM | POA: Diagnosis not present

## 2019-10-24 DIAGNOSIS — Z9011 Acquired absence of right breast and nipple: Secondary | ICD-10-CM | POA: Diagnosis not present

## 2019-10-24 DIAGNOSIS — C50811 Malignant neoplasm of overlapping sites of right female breast: Secondary | ICD-10-CM | POA: Insufficient documentation

## 2019-10-24 DIAGNOSIS — M25551 Pain in right hip: Secondary | ICD-10-CM | POA: Insufficient documentation

## 2019-10-24 DIAGNOSIS — Z87891 Personal history of nicotine dependence: Secondary | ICD-10-CM | POA: Insufficient documentation

## 2019-10-24 LAB — COMPREHENSIVE METABOLIC PANEL
ALT: 14 U/L (ref 0–44)
AST: 16 U/L (ref 15–41)
Albumin: 4.5 g/dL (ref 3.5–5.0)
Alkaline Phosphatase: 102 U/L (ref 38–126)
Anion gap: 12 (ref 5–15)
BUN: 17 mg/dL (ref 8–23)
CO2: 29 mmol/L (ref 22–32)
Calcium: 9.4 mg/dL (ref 8.9–10.3)
Chloride: 100 mmol/L (ref 98–111)
Creatinine, Ser: 0.66 mg/dL (ref 0.44–1.00)
GFR calc Af Amer: >60 mL/min (ref >60)
GFR calc non Af Amer: >60 mL/min (ref >60)
Glucose, Bld: 113 mg/dL — ABNORMAL HIGH (ref 70–99)
Potassium: 3.6 mmol/L (ref 3.5–5.1)
Sodium: 141 mmol/L (ref 135–145)
Total Bilirubin: 0.7 mg/dL (ref 0.3–1.2)
Total Protein: 7.7 g/dL (ref 6.5–8.1)

## 2019-10-24 LAB — CBC WITH DIFFERENTIAL/PLATELET
Abs Immature Granulocytes: 0.02 10*3/uL (ref 0.00–0.07)
Basophils Absolute: 0 10*3/uL (ref 0.0–0.1)
Basophils Relative: 0 %
Eosinophils Absolute: 0.2 10*3/uL (ref 0.0–0.5)
Eosinophils Relative: 4 %
HCT: 41.3 % (ref 36.0–46.0)
Hemoglobin: 13.3 g/dL (ref 12.0–15.0)
Immature Granulocytes: 0 %
Lymphocytes Relative: 21 %
Lymphs Abs: 1.3 10*3/uL (ref 0.7–4.0)
MCH: 27.1 pg (ref 26.0–34.0)
MCHC: 32.2 g/dL (ref 30.0–36.0)
MCV: 84.3 fL (ref 80.0–100.0)
Monocytes Absolute: 0.6 10*3/uL (ref 0.1–1.0)
Monocytes Relative: 10 %
Neutro Abs: 3.7 10*3/uL (ref 1.7–7.7)
Neutrophils Relative %: 65 %
Platelets: 220 10*3/uL (ref 150–400)
RBC: 4.9 MIL/uL (ref 3.87–5.11)
RDW: 14.1 % (ref 11.5–15.5)
WBC: 5.8 10*3/uL (ref 4.0–10.5)
nRBC: 0 % (ref 0.0–0.2)

## 2019-10-24 NOTE — Progress Notes (Signed)
RN Chaperoned provider with Breast Exam.   

## 2019-10-24 NOTE — Assessment & Plan Note (Addendum)
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  No evidence of recurrence.  STABLE.   #Left breast calcifications -followed by diagnostic mammogram December 2020; stable; awaiting repeat diagnostic mammo in dec 2021.  Today no masses felt.  # Right hip pain- x 3 days; recent x-ray negative any concerns for malignancy.  Continue NSAIDs; if not improved will get scans; bone scan.   # right post mastectomy neuropathic pain-stable.  Ontinue NSAIDs PRN  # Brother- died of ? Prostate cancer at 71y; mother- died of breast cancer at 28y; s/p Genetic counseling reviewed NEG for any deleterious mutations.  Reviewed with the patient.  # DISPOSITION: # follow-up in 6 months with labs-MD-cbc/cmp/ca-27-29-Dr.B.   CC: Dr.Hedrick, James/

## 2019-10-24 NOTE — Progress Notes (Signed)
[name], [name], [name], [name]  Cancer Staging No matching staging information was found for the patient.    Oncology History Overview Note  1. Carcinoma of the breast, status post lumpectomy and sentinel lymph node biopsy. June 2007ER/PR negative, Her-2 negative. PT2N1(mic)M0, STAGE IIb 2. Completed 6 cycles Cytoxan/Taxotere October 2007. Right breast XRT. 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 4. Right mastectomy, May 22, 2015. Location of mass was at 9:00. All the margins were clear. 4 lymph nodes were negative for any tumor. Invasive mammary carcinoma with apocrine features. Margins are negative. Informal vascular invasion present, rpT2 pNO cMO (triple negative). --------------------------------------------   DIAGNOSIS: Breast cancer  STAGE:  II   ;GOALS: cure  CURRENT/MOST RECENT THERAPY : survielleince    Carcinoma of overlapping sites of right breast in female, estrogen receptor negative ([name])  04/05/2016 Initial Diagnosis   Cancer of overlapping sites of right female breast [name]     INTERVAL HISTORY:  Julie [name] 79 y.o.  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  In the interim patient had a mammogram of the left side in December 2020-that shows stable calcifications.  She is recommended to repeat in mammogram in December 2021 to assess stability.  Patient complains of right hip pain for the last 3 days.  No falls no trauma.  States to have worsening pain with bending.  No radiation.  Denies any UTI or chills.  Currently improved on Aleve  once a day.  No headaches or nausea vomiting.  Review of Systems  Constitutional: Negative for chills, diaphoresis, fever, malaise/fatigue and weight loss.  HENT: Negative for nosebleeds and sore throat.   Eyes: Negative for double vision.  Respiratory: Negative for cough, hemoptysis, sputum production, shortness of breath and wheezing.   Cardiovascular: Negative for chest pain, palpitations, orthopnea and leg swelling.  Gastrointestinal: Negative for abdominal pain, blood in stool, constipation, diarrhea, heartburn, melena, nausea and vomiting.  Genitourinary: Negative for dysuria, frequency and urgency.  Musculoskeletal: Positive for back pain. Negative for joint pain.  Skin: Negative.  Negative for itching and rash.  Neurological: Negative for dizziness, tingling, focal weakness, weakness and headaches.  Endo/Heme/Allergies: Does not bruise/bleed easily.  Psychiatric/Behavioral: Negative for depression. The patient is not nervous/anxious and does not have insomnia.      PAST MEDICAL HISTORY :  Past Medical History:  Diagnosis Date  . Anemia   . Arthritis   . Breast cancer ([place]) 2007   right breast cancer  . Breast cancer ([place]) 2016   recurrent right breast cancer, triple negative  . Cancer [facility] 2007   Right breast  . Cancer of breast ([place]) 04/30/2015  . Diabetes mellitus without complication ([place])   . Family history of breast cancer   . Family history of prostate cancer   . GERD (gastroesophageal reflux disease)   . Hypertension   . Personal history of malignant neoplasm of breast   . Pre-diabetes   . Shortness of breath dyspnea     PAST SURGICAL HISTORY :   Past Surgical History:  Procedure Laterality Date  . ABDOMINAL HYSTERECTOMY    . APPENDECTOMY    .  BREAST BIOPSY Right 2007 and 2016   positive  . BREAST EXCISIONAL BIOPSY Left    neg  . BREAST LUMPECTOMY Right 2007  . CATARACT EXTRACTION, BILATERAL Bilateral   . COLONOSCOPY WITH PROPOFOL N/A 06/14/2018    Procedure: COLONOSCOPY WITH PROPOFOL;  Surgeon: Toledo, Benay Pike, MD;  Location: [facility] ENDOSCOPY;  Service: Gastroenterology;  Laterality: N/A;  . JOINT REPLACEMENT Left 2011   knee  . JOINT REPLACEMENT Right 12/2011   knee  . KNEE SURGERY Right 03/2012  . MASTECTOMY Right 2016  . MASTECTOMY MODIFIED RADICAL Right 05/22/2015   Procedure: MASTECTOMY MODIFIED RADICAL;  Surgeon: Christene Lye, MD;  Location: [facility] ORS;  Service: General;  Laterality: Right;    FAMILY HISTORY :   Family History  Problem Relation Age of Onset  . Breast cancer Mother        dx late 19s  . Heart attack Father   . Heart attack Sister 56  . Prostate cancer Brother        dx 15s  . Dementia Paternal Grandfather     SOCIAL HISTORY:   Social History   Tobacco Use  . Smoking status: Former Smoker    Packs/day: 0.75    Years: 2.00    Pack years: 1.50    Types: Cigarettes    Quit date: 05/15/1980    Years since quitting: 39.4  . Smokeless tobacco: Never Used  Substance Use Topics  . Alcohol use: No  . Drug use: No    ALLERGIES:  is allergic to gabapentin and penicillin g.  MEDICATIONS:  Current Outpatient Medications  Medication Sig Dispense Refill  . amLODipine (NORVASC) 10 MG tablet Take 1 tablet by mouth daily.    . cyanocobalamin (,VITAMIN B-12,) 1000 MCG/ML injection Inject 1,000 mcg into the muscle every 30 (thirty) days.    Marland Kitchen KLOR-CON M20 20 MEQ tablet Take 60 mEq by mouth once.     . simvastatin (ZOCOR) 20 MG tablet Take 20 mg by mouth daily at 6 PM.     . triamterene-hydrochlorothiazide (MAXZIDE-25) 37.5-25 MG per tablet Take 1 tablet by mouth daily.     . cyclobenzaprine (FLEXERIL) 5 MG tablet Take 1 tablet by mouth daily.    . meloxicam (MOBIC) 15 MG tablet Take 1 tablet by mouth daily.     No current facility-administered medications for this visit.    PHYSICAL EXAMINATION: ECOG PERFORMANCE STATUS: 0 - Asymptomatic  BP (!) 148/55 (BP Location: Left Arm, Patient Position:  Sitting)   Pulse 62   Temp 97.6 F (36.4 C) (Tympanic)   Resp 18   Wt 162 lb 3.2 oz (73.6 kg)   SpO2 99%   BMI 29.67 kg/m   Filed Weights   10/24/19 1025  Weight: 162 lb 3.2 oz (73.6 kg)    Physical Exam  Constitutional: She is oriented to person, place, and time and well-developed, well-nourished, and in no distress.  HENT:  Head: Normocephalic and atraumatic.  Mouth/Throat: Oropharynx is clear and moist. No oropharyngeal exudate.  Eyes: Pupils are equal, round, and reactive to light.  Cardiovascular: Normal rate and regular rhythm.  Pulmonary/Chest: No respiratory distress. She has no wheezes.  Abdominal: Soft. Bowel sounds are normal. She exhibits no distension and no mass. There is no abdominal tenderness. There is no rebound and no guarding.  Musculoskeletal:        General: No tenderness or edema. Normal range of motion.     Cervical back: Normal range of motion and neck supple.  Neurological: She is alert and oriented to person, place, and time.  Skin: Skin is warm.  Psychiatric: Affect normal.      LABORATORY DATA:  I have reviewed the data as listed    Component Value Date/Time   NA 141 10/24/2019 1005   NA 138 04/25/2015 1210   NA 141 03/15/2012 1210   K 3.6 10/24/2019 1005   K 3.2 (L) 03/21/2012 1256   CL 100 10/24/2019 1005   CL 102 03/15/2012 1210   CO2 29 10/24/2019 1005   CO2 33 (H) 03/15/2012 1210   GLUCOSE 113 (H) 10/24/2019 1005   GLUCOSE 113 (H) 03/15/2012 1210   BUN 17 10/24/2019 1005   BUN 15 04/25/2015 1210   BUN 10 03/15/2012 1210   CREATININE 0.66 10/24/2019 1005   CREATININE 0.75 03/15/2012 1210   CALCIUM 9.4 10/24/2019 1005   CALCIUM 9.9 03/15/2012 1210   PROT 7.7 10/24/2019 1005   PROT 7.1 04/25/2015 1210   ALBUMIN 4.5 10/24/2019 1005   ALBUMIN 4.7 04/25/2015 1210   AST 16 10/24/2019 1005   ALT 14 10/24/2019 1005   ALKPHOS 102 10/24/2019 1005   BILITOT 0.7 10/24/2019 1005   BILITOT 0.3 04/25/2015 1210   GFRNONAA >60  10/24/2019 1005   GFRNONAA >60 03/15/2012 1210   GFRAA >60 10/24/2019 1005   GFRAA >60 03/15/2012 1210    No results found for: SPEP, UPEP  Lab Results  Component Value Date   WBC 5.8 10/24/2019   NEUTROABS 3.7 10/24/2019   HGB 13.3 10/24/2019   HCT 41.3 10/24/2019   MCV 84.3 10/24/2019   PLT 220 10/24/2019      Chemistry      Component Value Date/Time   NA 141 10/24/2019 1005   NA 138 04/25/2015 1210   NA 141 03/15/2012 1210   K 3.6 10/24/2019 1005   K 3.2 (L) 03/21/2012 1256   CL 100 10/24/2019 1005   CL 102 03/15/2012 1210   CO2 29 10/24/2019 1005   CO2 33 (H) 03/15/2012 1210   BUN 17 10/24/2019 1005   BUN 15 04/25/2015 1210   BUN 10 03/15/2012 1210   CREATININE 0.66 10/24/2019 1005   CREATININE 0.75 03/15/2012 1210      Component Value Date/Time   CALCIUM 9.4 10/24/2019 1005   CALCIUM 9.9 03/15/2012 1210   ALKPHOS 102 10/24/2019 1005   AST 16 10/24/2019 1005   ALT 14 10/24/2019 1005   BILITOT 0.7 10/24/2019 1005   BILITOT 0.3 04/25/2015 1210       RADIOGRAPHIC STUDIES: I have personally reviewed the radiological images as listed and agreed with the findings in the report. No results found.   ASSESSMENT & PLAN:  Carcinoma of overlapping sites of right breast in female, estrogen receptor negative ([place]) # Right breast ipsilateral breast recurrence triple negative [Nov 2016] s/p mastectomy no adjuvant chemotherapy.  No evidence of recurrence.  STABLE.   #Left breast calcifications -followed by diagnostic mammogram December 2020; stable; awaiting repeat diagnostic mammo in dec 2021.  Today no masses felt.  # Right hip pain- x 3 days; recent x-ray negative any concerns for malignancy.  Continue NSAIDs; if not improved will get scans; bone scan.   # right post mastectomy neuropathic pain-stable.  Ontinue NSAIDs PRN  # Brother- died of ? Prostate cancer at 37y; mother- died of breast cancer at 59y; s/p Genetic counseling reviewed NEG for any deleterious  mutations.  Reviewed with the patient.  # DISPOSITION: # follow-up in 6 months with  labs-MD-cbc/cmp/ca-27-29-Dr.B.   CC: Dr.Hedrick, James/    Orders Placed This Encounter  Procedures  . CBC with Differential    Standing Status:   Future    Standing Expiration Date:   10/23/2020  . Comprehensive metabolic panel    Standing Status:   Future    Standing Expiration Date:   10/23/2020  . Cancer antigen 27.29    Standing Status:   Future    Standing Expiration Date:   10/23/2020   All questions were answered. The patient knows to call the clinic with any problems, questions or concerns.      Cammie Sickle, MD 10/24/2019 10:48 AM

## 2019-10-24 NOTE — Progress Notes (Signed)
Pt in for follow up, reports was unaware of appt missed in December. Pt having some lower back pain on right side.

## 2019-10-25 LAB — CANCER ANTIGEN 27.29: CA 27.29: 14.7 U/mL (ref 0.0–38.6)

## 2020-04-23 ENCOUNTER — Other Ambulatory Visit: Payer: Self-pay

## 2020-04-23 ENCOUNTER — Inpatient Hospital Stay: Payer: Medicare HMO | Attending: Internal Medicine

## 2020-04-23 ENCOUNTER — Inpatient Hospital Stay (HOSPITAL_BASED_OUTPATIENT_CLINIC_OR_DEPARTMENT_OTHER): Payer: Medicare HMO | Admitting: Internal Medicine

## 2020-04-23 DIAGNOSIS — Z171 Estrogen receptor negative status [ER-]: Secondary | ICD-10-CM | POA: Insufficient documentation

## 2020-04-23 DIAGNOSIS — C50811 Malignant neoplasm of overlapping sites of right female breast: Secondary | ICD-10-CM

## 2020-04-23 DIAGNOSIS — Z87891 Personal history of nicotine dependence: Secondary | ICD-10-CM | POA: Diagnosis not present

## 2020-04-23 DIAGNOSIS — Z9011 Acquired absence of right breast and nipple: Secondary | ICD-10-CM | POA: Insufficient documentation

## 2020-04-23 LAB — COMPREHENSIVE METABOLIC PANEL
ALT: 16 U/L (ref 0–44)
AST: 19 U/L (ref 15–41)
Albumin: 4.3 g/dL (ref 3.5–5.0)
Alkaline Phosphatase: 91 U/L (ref 38–126)
Anion gap: 8 (ref 5–15)
BUN: 17 mg/dL (ref 8–23)
CO2: 29 mmol/L (ref 22–32)
Calcium: 9.1 mg/dL (ref 8.9–10.3)
Chloride: 102 mmol/L (ref 98–111)
Creatinine, Ser: 0.59 mg/dL (ref 0.44–1.00)
GFR, Estimated: >60 mL/min (ref >60)
Glucose, Bld: 110 mg/dL — ABNORMAL HIGH (ref 70–99)
Potassium: 3.4 mmol/L — ABNORMAL LOW (ref 3.5–5.1)
Sodium: 139 mmol/L (ref 135–145)
Total Bilirubin: 0.6 mg/dL (ref 0.3–1.2)
Total Protein: 7.4 g/dL (ref 6.5–8.1)

## 2020-04-23 LAB — CBC WITH DIFFERENTIAL/PLATELET
Abs Immature Granulocytes: 0.01 10*3/uL (ref 0.00–0.07)
Basophils Absolute: 0 10*3/uL (ref 0.0–0.1)
Basophils Relative: 1 %
Eosinophils Absolute: 0.2 10*3/uL (ref 0.0–0.5)
Eosinophils Relative: 5 %
HCT: 40.3 % (ref 36.0–46.0)
Hemoglobin: 13.1 g/dL (ref 12.0–15.0)
Immature Granulocytes: 0 %
Lymphocytes Relative: 26 %
Lymphs Abs: 1.2 10*3/uL (ref 0.7–4.0)
MCH: 27.1 pg (ref 26.0–34.0)
MCHC: 32.5 g/dL (ref 30.0–36.0)
MCV: 83.4 fL (ref 80.0–100.0)
Monocytes Absolute: 0.4 10*3/uL (ref 0.1–1.0)
Monocytes Relative: 10 %
Neutro Abs: 2.7 10*3/uL (ref 1.7–7.7)
Neutrophils Relative %: 58 %
Platelets: 242 10*3/uL (ref 150–400)
RBC: 4.83 MIL/uL (ref 3.87–5.11)
RDW: 14.3 % (ref 11.5–15.5)
WBC: 4.5 10*3/uL (ref 4.0–10.5)
nRBC: 0 % (ref 0.0–0.2)

## 2020-04-23 NOTE — Progress Notes (Signed)
[place] OFFICE PROGRESS NOTE  Patient Care Team: [place] Pink, [name], [name], [name]  Cancer Staging No matching staging information was found for the patient.    Oncology History Overview Note  1. Carcinoma of the breast, status post lumpectomy and sentinel lymph node biopsy. June 2007ER/PR negative, Her-2 negative. PT2N1(mic)M0, STAGE IIb 2. Completed 6 cycles Cytoxan/Taxotere October 2007. Right breast XRT. 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 4. Right mastectomy, May 22, 2015. Location of mass was at 9:00. All the margins were clear. 4 lymph nodes were negative for any tumor. Invasive mammary carcinoma with apocrine features. Margins are negative. Informal vascular invasion present, rpT2 pNO cMO (triple negative). --------------------------------------------   DIAGNOSIS: Breast cancer  STAGE:  II   ;GOALS: cure  CURRENT/MOST RECENT THERAPY : survielleince    Carcinoma of overlapping sites of right breast in female, estrogen receptor negative ([place])  04/05/2016 Initial Diagnosis   Cancer of overlapping sites of right female breast [facility])     INTERVAL HISTORY:  Julie [name] 79 y.o.  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  Patient denies any unusual lumps or bumps.  Appetite is good.  No weight loss but no headaches.  No nausea no vomiting.  Chronic joint pains  Review of Systems  Constitutional: Negative for chills, diaphoresis, fever, malaise/fatigue and weight loss.  HENT: Negative for nosebleeds and sore throat.   Eyes: Negative for double vision.  Respiratory: Negative for cough,  hemoptysis, sputum production, shortness of breath and wheezing.   Cardiovascular: Negative for chest pain, palpitations, orthopnea and leg swelling.  Gastrointestinal: Negative for abdominal pain, blood in stool, constipation, diarrhea, heartburn, melena, nausea and vomiting.  Genitourinary: Negative for dysuria, frequency and urgency.  Musculoskeletal: Positive for back pain. Negative for joint pain.  Skin: Negative.  Negative for itching and rash.  Neurological: Negative for dizziness, tingling, focal weakness, weakness and headaches.  Endo/Heme/Allergies: Does not bruise/bleed easily.  Psychiatric/Behavioral: Negative for depression. The patient is not nervous/anxious and does not have insomnia.      PAST MEDICAL HISTORY :  Past Medical History:  Diagnosis Date  . Anemia   . Arthritis   . Breast cancer ([place]) 2007   right breast cancer  . Breast cancer ([place]) 2016   recurrent right breast cancer, triple negative  . Cancer [facility]) 2007   Right breast  . Cancer of breast ([place]) 04/30/2015  . Diabetes mellitus without complication ([place])   . Family history of breast cancer   . Family history of prostate cancer   . GERD (gastroesophageal reflux disease)   . Hypertension   . Personal history of malignant neoplasm of breast   . Pre-diabetes   . Shortness of breath dyspnea     PAST SURGICAL HISTORY :   Past Surgical History:  Procedure Laterality Date  . ABDOMINAL HYSTERECTOMY    . APPENDECTOMY    . BREAST BIOPSY Right 2007 and 2016   positive  . BREAST EXCISIONAL BIOPSY Left    neg  . BREAST LUMPECTOMY Right 2007  . CATARACT EXTRACTION, BILATERAL Bilateral   . COLONOSCOPY WITH PROPOFOL N/A 06/14/2018   Procedure: COLONOSCOPY WITH PROPOFOL;  Surgeon: Fulton, Lyons,  MD;  Location: [facility] ENDOSCOPY;  Service: Gastroenterology;  Laterality: N/A;  . JOINT REPLACEMENT Left 2011   knee  . JOINT REPLACEMENT Right 12/2011   knee  . KNEE SURGERY Right 03/2012  . MASTECTOMY Right 2016   . MASTECTOMY MODIFIED RADICAL Right 05/22/2015   Procedure: MASTECTOMY MODIFIED RADICAL;  Surgeon: Christene Lye, MD;  Location: [facility] ORS;  Service: General;  Laterality: Right;    FAMILY HISTORY :   Family History  Problem Relation Age of Onset  . Breast cancer Mother        dx late 76s  . Heart attack Father   . Heart attack Sister 12  . Prostate cancer Brother        dx 5s  . Dementia Paternal Grandfather     SOCIAL HISTORY:   Social History   Tobacco Use  . Smoking status: Former Smoker    Packs/day: 0.75    Years: 2.00    Pack years: 1.50    Types: Cigarettes    Quit date: 05/15/1980    Years since quitting: 39.9  . Smokeless tobacco: Never Used  Vaping Use  . Vaping Use: Never used  Substance Use Topics  . Alcohol use: No  . Drug use: No    ALLERGIES:  is allergic to gabapentin and penicillin g.  MEDICATIONS:  Current Outpatient Medications  Medication Sig Dispense Refill  . amLODipine (NORVASC) 10 MG tablet Take 1 tablet by mouth daily.    . cyanocobalamin (,VITAMIN B-12,) 1000 MCG/ML injection Inject 1,000 mcg into the muscle every 30 (thirty) days.    Marland Kitchen KLOR-CON M20 20 MEQ tablet Take 60 mEq by mouth once.     . simvastatin (ZOCOR) 20 MG tablet Take 20 mg by mouth daily at 6 PM.     . triamterene-hydrochlorothiazide (MAXZIDE-25) 37.5-25 MG per tablet Take 1 tablet by mouth daily.      No current facility-administered medications for this visit.    PHYSICAL EXAMINATION: ECOG PERFORMANCE STATUS: 0 - Asymptomatic  BP 133/61 (BP Location: Left Arm, Patient Position: Sitting)   Pulse 71   Temp 97.9 F (36.6 C) (Tympanic)   Resp 18   Wt 166 lb (75.3 kg)   SpO2 100%   BMI 30.36 kg/m   Filed Weights   04/23/20 1005  Weight: 166 lb (75.3 kg)    Physical Exam HENT:     Head: Normocephalic and atraumatic.     Mouth/Throat:     Pharynx: No oropharyngeal exudate.  Eyes:     Pupils: Pupils are equal, round, and reactive to light.   Cardiovascular:     Rate and Rhythm: Normal rate and regular rhythm.  Pulmonary:     Effort: No respiratory distress.     Breath sounds: No wheezing.  Abdominal:     General: Bowel sounds are normal. There is no distension.     Palpations: Abdomen is soft. There is no mass.     Tenderness: There is no abdominal tenderness. There is no guarding or rebound.  Musculoskeletal:        General: No tenderness. Normal range of motion.     Cervical back: Normal range of motion and neck supple.  Skin:    General: Skin is warm.  Neurological:     Mental Status: She is alert and oriented to person, place, and time.  Psychiatric:        Mood and Affect: Affect normal.       LABORATORY DATA:  I have  reviewed the data as listed    Component Value Date/Time   NA 139 04/23/2020 0952   NA 138 04/25/2015 1210   NA 141 03/15/2012 1210   K 3.4 (L) 04/23/2020 0952   K 3.2 (L) 03/21/2012 1256   CL 102 04/23/2020 0952   CL 102 03/15/2012 1210   CO2 29 04/23/2020 0952   CO2 33 (H) 03/15/2012 1210   GLUCOSE 110 (H) 04/23/2020 0952   GLUCOSE 113 (H) 03/15/2012 1210   BUN 17 04/23/2020 0952   BUN 15 04/25/2015 1210   BUN 10 03/15/2012 1210   CREATININE 0.59 04/23/2020 0952   CREATININE 0.75 03/15/2012 1210   CALCIUM 9.1 04/23/2020 0952   CALCIUM 9.9 03/15/2012 1210   PROT 7.4 04/23/2020 0952   PROT 7.1 04/25/2015 1210   ALBUMIN 4.3 04/23/2020 0952   ALBUMIN 4.7 04/25/2015 1210   AST 19 04/23/2020 0952   ALT 16 04/23/2020 0952   ALKPHOS 91 04/23/2020 0952   BILITOT 0.6 04/23/2020 0952   BILITOT 0.3 04/25/2015 1210   GFRNONAA >60 04/23/2020 0952   GFRNONAA >60 03/15/2012 1210   GFRAA >60 10/24/2019 1005   GFRAA >60 03/15/2012 1210    No results found for: SPEP, UPEP  Lab Results  Component Value Date   WBC 4.5 04/23/2020   NEUTROABS 2.7 04/23/2020   HGB 13.1 04/23/2020   HCT 40.3 04/23/2020   MCV 83.4 04/23/2020   PLT 242 04/23/2020      Chemistry      Component Value  Date/Time   NA 139 04/23/2020 0952   NA 138 04/25/2015 1210   NA 141 03/15/2012 1210   K 3.4 (L) 04/23/2020 0952   K 3.2 (L) 03/21/2012 1256   CL 102 04/23/2020 0952   CL 102 03/15/2012 1210   CO2 29 04/23/2020 0952   CO2 33 (H) 03/15/2012 1210   BUN 17 04/23/2020 0952   BUN 15 04/25/2015 1210   BUN 10 03/15/2012 1210   CREATININE 0.59 04/23/2020 0952   CREATININE 0.75 03/15/2012 1210      Component Value Date/Time   CALCIUM 9.1 04/23/2020 0952   CALCIUM 9.9 03/15/2012 1210   ALKPHOS 91 04/23/2020 0952   AST 19 04/23/2020 0952   ALT 16 04/23/2020 0952   BILITOT 0.6 04/23/2020 0952   BILITOT 0.3 04/25/2015 1210       RADIOGRAPHIC STUDIES: I have personally reviewed the radiological images as listed and agreed with the findings in the report. No results found.   ASSESSMENT & PLAN:  Carcinoma of overlapping sites of right breast in female, estrogen receptor negative ([place]) # Right breast ipsilateral breast recurrence triple negative [Nov 2016] s/p mastectomy no adjuvant chemotherapy.  No evidence of recurrence.  Stable.  # Left breast calcifications -followed by diagnostic mammogram December 2020-STABLE. Awaiting repeat diagnostic mammo in dec 2021.  # Right hip pain/arthtis- on prn NSAIDs-STABLE.   # right post mastectomy neuropathic pain- STABLE;  Conntinue NSAIDs PRN  # Surveillaince for osteporosis-discussed with the patient the importance of surveillance for osteoporosis.  Patient has multiple risk factors for osteoporosis-most importantly age.  Recommend a bone density test in the next 1 to 2 weeks.  Discussed the treatment options in general would recommend calcium plus vitamin D; and increasing physical activity.  Also discussed regarding bisphosphonate therapy/and also Prolia; Reclast.  Will call patient once available.  # DISPOSITION: will call with results # BMD- 1-2 weeks # follow-up in 12  months with labs-MD-cbc/cmp/ca-27-29-Dr.B.   CC: Dr.Hedrick,  James/     Orders Placed This Encounter  Procedures  . DG Bone Density    Standing Status:   Future    Standing Expiration Date:   04/23/2021    Order Specific Question:   Reason for Exam (SYMPTOM  OR DIAGNOSIS REQUIRED)    Answer:   History of breast hx    Order Specific Question:   Preferred imaging location?    Answer:   [facility]   All questions were answered. The patient knows to call the clinic with any problems, questions or concerns.      Cammie Sickle, MD 04/29/2020 2:10 PM

## 2020-04-23 NOTE — Assessment & Plan Note (Addendum)
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  No evidence of recurrence.  Stable.  # Left breast calcifications -followed by diagnostic mammogram December 2020-STABLE. Awaiting repeat diagnostic mammo in dec 2021.  # Right hip pain/arthtis- on prn NSAIDs-STABLE.   # right post mastectomy neuropathic pain- STABLE;  Conntinue NSAIDs PRN  # Surveillaince for osteporosis-discussed with the patient the importance of surveillance for osteoporosis.  Patient has multiple risk factors for osteoporosis-most importantly age.  Recommend a bone density test in the next 1 to 2 weeks.  Discussed the treatment options in general would recommend calcium plus vitamin D; and increasing physical activity.  Also discussed regarding bisphosphonate therapy/and also Prolia; Reclast.  Will call patient once available.  # DISPOSITION: will call with results # BMD- 1-2 weeks # follow-up in 12  months with labs-MD-cbc/cmp/ca-27-29-Dr.B.   CC: Dr.Hedrick, James/

## 2020-04-24 LAB — CANCER ANTIGEN 27.29: CA 27.29: 7.6 U/mL (ref 0.0–38.6)

## 2020-04-30 ENCOUNTER — Other Ambulatory Visit: Payer: Self-pay

## 2020-04-30 ENCOUNTER — Ambulatory Visit
Admission: RE | Admit: 2020-04-30 | Discharge: 2020-04-30 | Disposition: A | Discharge status: Home / Self Care | Payer: Medicare HMO | Source: Ambulatory Visit | Attending: Internal Medicine | Admitting: Internal Medicine

## 2020-04-30 DIAGNOSIS — Z780 Asymptomatic menopausal state: Secondary | ICD-10-CM | POA: Insufficient documentation

## 2020-04-30 DIAGNOSIS — Z853 Personal history of malignant neoplasm of breast: Secondary | ICD-10-CM | POA: Diagnosis not present

## 2020-04-30 DIAGNOSIS — Z13820 Encounter for screening for osteoporosis: Secondary | ICD-10-CM | POA: Diagnosis not present

## 2020-04-30 DIAGNOSIS — M85832 Other specified disorders of bone density and structure, left forearm: Secondary | ICD-10-CM | POA: Diagnosis not present

## 2020-04-30 DIAGNOSIS — C50811 Malignant neoplasm of overlapping sites of right female breast: Secondary | ICD-10-CM

## 2020-05-15 ENCOUNTER — Other Ambulatory Visit: Payer: Self-pay

## 2020-05-15 DIAGNOSIS — R920 Mammographic microcalcification found on diagnostic imaging of breast: Secondary | ICD-10-CM

## 2020-06-26 ENCOUNTER — Ambulatory Visit
Admission: RE | Admit: 2020-06-26 | Discharge: 2020-06-26 | Disposition: A | Discharge status: Home / Self Care | Payer: Medicare HMO | Source: Ambulatory Visit | Attending: Surgery | Admitting: Surgery

## 2020-06-26 ENCOUNTER — Other Ambulatory Visit: Payer: Self-pay

## 2020-06-26 DIAGNOSIS — R920 Mammographic microcalcification found on diagnostic imaging of breast: Secondary | ICD-10-CM | POA: Diagnosis not present

## 2020-07-02 ENCOUNTER — Ambulatory Visit (INDEPENDENT_AMBULATORY_CARE_PROVIDER_SITE_OTHER): Payer: Medicare HMO | Admitting: Surgery

## 2020-07-02 ENCOUNTER — Encounter: Payer: Self-pay | Admitting: Surgery

## 2020-07-02 ENCOUNTER — Other Ambulatory Visit: Payer: Self-pay

## 2020-07-02 DIAGNOSIS — R920 Mammographic microcalcification found on diagnostic imaging of breast: Secondary | ICD-10-CM

## 2020-07-02 NOTE — Patient Instructions (Addendum)
Dr.Pabon recommends patient to have 1 year mammogram and a follow up office visit. Follow-up with our office as needed. Please call and ask to speak with a nurse if you develop questions or concerns.  Breast Self-Awareness Breast self-awareness is knowing how your breasts look and feel. Doing breast self-awareness is important. It allows you to catch a breast problem early while it is still small and can be treated. All women should do breast self-awareness, including women who have had breast implants. Tell your doctor if you notice a change in your breasts. What you need:  A mirror.  A well-lit room. How to do a breast self-exam A breast self-exam is one way to learn what is normal for your breasts and to check for changes. To do a breast self-exam: Look for changes  1. Take off all the clothes above your waist. 2. Stand in front of a mirror in a room with good lighting. 3. Put your hands on your hips. 4. Push your hands down. 5. Look at your breasts and nipples in the mirror to see if one breast or nipple looks different from the other. Check to see if: ? The shape of one breast is different. ? The size of one breast is different. ? There are wrinkles, dips, and bumps in one breast and not the other. 6. Look at each breast for changes in the skin, such as: ? Redness. ? Scaly areas. 7. Look for changes in your nipples, such as: ? Liquid around the nipples. ? Bleeding. ? Dimpling. ? Redness. ? A change in where the nipples are. Feel for changes  1. Lie on your back on the floor. 2. Feel each breast. To do this, follow these steps: ? Pick a breast to feel. ? Put the arm closest to that breast above your head. ? Use your other arm to feel the nipple area of your breast. Feel the area with the pads of your three middle fingers by making small circles with your fingers. For the first circle, press lightly. For the second circle, press harder. For the third circle, press even  harder. ? Keep making circles with your fingers at the different pressures as you move down your breast. Stop when you feel your ribs. ? Move your fingers a little toward the center of your body. ? Start making circles with your fingers again, this time going up until you reach your collarbone. ? Keep making up-and-down circles until you reach your armpit. Remember to keep using the three pressures. ? Feel the other breast in the same way. 3. Sit or stand in the tub or shower. 4. With soapy water on your skin, feel each breast the same way you did in step 2 when you were lying on the floor. Write down what you find Writing down what you find can help you remember what to tell your doctor. Write down:  What is normal for each breast.  Any changes you find in each breast, including: ? The kind of changes you find. ? Whether you have pain. ? Size and location of any lumps.  When you last had your menstrual period. General tips  Check your breasts every month.  If you are breastfeeding, the best time to check your breasts is after you feed your baby or after you use a breast pump.  If you get menstrual periods, the best time to check your breasts is 5-7 days after your menstrual period is over.  With time, you will  become comfortable with the self-exam, and you will begin to know if there are changes in your breasts. Contact a doctor if you:  See a change in the shape or size of your breasts or nipples.  See a change in the skin of your breast or nipples, such as red or scaly skin.  Have fluid coming from your nipples that is not normal.  Find a lump or thick area that was not there before.  Have pain in your breasts.  Have any concerns about your breast health. Summary  Breast self-awareness includes looking for changes in your breasts, as well as feeling for changes within your breasts.  Breast self-awareness should be done in front of a mirror in a well-lit room.  You  should check your breasts every month. If you get menstrual periods, the best time to check your breasts is 5-7 days after your menstrual period is over.  Let your doctor know of any changes you see in your breasts, including changes in size, changes on the skin, pain or tenderness, or fluid from your nipples that is not normal. This information is not intended to replace advice given to you by your health care provider. Make sure you discuss any questions you have with your health care provider. Document Revised: 02/14/2018 Document Reviewed: 02/14/2018 Elsevier Patient Education  [place].

## 2020-07-03 ENCOUNTER — Telehealth: Payer: Self-pay | Admitting: Internal Medicine

## 2020-07-03 NOTE — Telephone Encounter (Signed)
On 12/22- Spoke to pt re: regarding the results of osteopenia noted on bone density.  Continue calcium plus vitamin D/exercise..  Follow-up as planned.  No new recommendations.

## 2020-07-04 NOTE — Progress Notes (Signed)
Outpatient Surgical Follow Up  07/04/2020  Julie [name] is an 79 y.o. female.   Chief Complaint  Patient presents with  . Follow-up      1year f/u mammogram [facility] 06/26/20    HPI: 79 yo with a history of right breast cancer status post mastectomy 2007 by Dr. Jamal Collin.  Has no issues after the mastectomy.  She denies any complaints at this time.  No fevers no chills she does self exams monthly. Does have a history of abnormal calcifications left breast and now comes for yearly mammogram and physical exam.  I personally reviewed the mammogram showing stable calcifications that are likely benign.  No evidence of suspicious lesions. Otherwise he is in good health  Past Medical History:  Diagnosis Date  . Anemia   . Arthritis   . Breast cancer ([place]) 2007   right breast cancer  . Breast cancer ([place]) 2016   recurrent right breast cancer, triple negative  . Cancer [facility]) 2007   Right breast  . Cancer of breast ([place]) 04/30/2015  . Diabetes mellitus without complication ([place])   . Family history of breast cancer   . Family history of prostate cancer   . GERD (gastroesophageal reflux disease)   . Hypertension   . Personal history of malignant neoplasm of breast   . Pre-diabetes   . Shortness of breath dyspnea     Past Surgical History:  Procedure Laterality Date  . ABDOMINAL HYSTERECTOMY    . APPENDECTOMY    . BREAST BIOPSY Right 2007 and 2016   positive  . BREAST EXCISIONAL BIOPSY Left    neg  . BREAST LUMPECTOMY Right 2007  . CATARACT EXTRACTION, BILATERAL Bilateral   . COLONOSCOPY WITH PROPOFOL N/A 06/14/2018   Procedure: COLONOSCOPY WITH PROPOFOL;  Surgeon: Toledo, Benay Pike, MD;  Location: [facility] ENDOSCOPY;  Service: Gastroenterology;  Laterality: N/A;  . JOINT REPLACEMENT Left 2011   knee  . JOINT REPLACEMENT Right 12/2011   knee  . KNEE SURGERY Right 03/2012  . MASTECTOMY Right 2016  . MASTECTOMY MODIFIED RADICAL Right 05/22/2015   Procedure: MASTECTOMY MODIFIED RADICAL;   Surgeon: Christene Lye, MD;  Location: [facility] ORS;  Service: General;  Laterality: Right;    Family History  Problem Relation Age of Onset  . Breast cancer Mother        dx late 75s  . Heart attack Father   . Heart attack Sister 68  . Prostate cancer Brother        dx 9s  . Dementia Paternal Grandfather     Social History:  reports that she quit smoking about 40 years ago. Her smoking use included cigarettes. She has a 1.50 pack-year smoking history. She has never used smokeless tobacco. She reports that she does not drink alcohol and does not use drugs.  Allergies:  Allergies  Allergen Reactions  . Gabapentin Other (See Comments)    dizziness  . Penicillin G Rash    Medications reviewed.    ROS Full ROS performed and is otherwise negative other than what is stated in HPI  Physical Exam Physical Exam Vitals and nursing note reviewed. Exam conducted with a chaperone present.  Constitutional:      Appearance: Normal appearance. She is normal weight.  Eyes:     General: No scleral icterus.       Right eye: No discharge.        Left eye: No discharge.  Cardiovascular:     Rate and Rhythm: Normal rate.  Pulses: Normal pulses.     Heart sounds: No murmur.  Pulmonary:     Effort: Pulmonary effort is normal. No respiratory distress.     Breath sounds: Normal breath sounds. No stridor.     Comments: BREAST: Right mastectomy scar, no new chest wall lesions, No masses on the left, no LAD. Left breast w/o any palpable lesion, nml nipple Abdominal:     General: Abdomen is flat. There is no distension.     Palpations: There is no mass.     Tenderness: There is no abdominal tenderness.     Hernia: No hernia is present.  Musculoskeletal:     Cervical back: Normal range of motion and neck supple. No rigidity or tenderness.  Skin:    Capillary Refill: Capillary refill takes less than 2 seconds.  Neurological:     General: No focal deficit present.     Mental Status:  She is alert and oriented to person, place, and time.  Psychiatric:        Mood and Affect: Mood normal.        Thought Content: Thought content normal.       Judgment: Judgment normal.   Assessment/Plan:  79 year old with prior history of right breast cancer status post mastectomy.  Currently there is no evidence of recurrent chest wall lesions and there is no evidence of any pathology on the left breast.  Recommend yearly mammogram with physical exam.  No need for further interventions at this time.   Greater than 50% of the 25 minutes  visit was spent in counseling/coordination of care   Caroleen Hamman, MD [place] Surgeon

## 2021-01-05 ENCOUNTER — Telehealth: Payer: Self-pay | Admitting: Internal Medicine

## 2021-01-05 NOTE — Telephone Encounter (Signed)
01/05/2021 Pt called and stated that her PCP recommended she f/u w/ Dr. B due to her having a sore spot on her back-it is on the bone. Pt is scheduled to be seen 04/23/21. She asked that someone call her back and let her know how soon she should be seen so she can make a new appt SRW

## 2021-01-05 NOTE — Telephone Encounter (Signed)
Dr. Jacinto Reap- This is Dr. Barbarann Ehlers note.  "Continue to follow with oncology, doing well, having some pain on the side where she had the surgery up around her shoulder blade, encouraged her to talk with oncology about this, worsened with raising her arm"

## 2021-01-06 NOTE — Telephone Encounter (Addendum)
Patient contacted. Pt has a h/o breast cancer. Right shoulder pain has continued 2 weeks. Patient states that the site started out with an itching sensation under the arm pit/shoulder. Pt states that pain is in the right armpit.  Pt denies any signs of redness/rashes or blisters. Patient states that there is some swelling noted on the right.  She states that she did have lymph nodes previously removed approximately 5-6 years ago.  She is taking ibuprofen for pain. Patient denies any trauma to the the R arm. She denies any cuts or scratches/wounds to that arm/hand. She does not have any compression sleeves or h/o lymphedema.  Advised pt to come to clinic tomorrow afternoon at 2:30 for evaluation and work up for possible lymphedema.

## 2021-01-07 ENCOUNTER — Other Ambulatory Visit: Payer: Self-pay

## 2021-01-07 ENCOUNTER — Inpatient Hospital Stay: Payer: Medicare HMO | Attending: Internal Medicine | Admitting: Internal Medicine

## 2021-01-07 DIAGNOSIS — C50811 Malignant neoplasm of overlapping sites of right female breast: Secondary | ICD-10-CM | POA: Diagnosis not present

## 2021-01-07 DIAGNOSIS — Z171 Estrogen receptor negative status [ER-]: Secondary | ICD-10-CM | POA: Insufficient documentation

## 2021-01-07 DIAGNOSIS — M8580 Other specified disorders of bone density and structure, unspecified site: Secondary | ICD-10-CM | POA: Insufficient documentation

## 2021-01-07 DIAGNOSIS — Z9011 Acquired absence of right breast and nipple: Secondary | ICD-10-CM | POA: Diagnosis not present

## 2021-01-07 DIAGNOSIS — M25511 Pain in right shoulder: Secondary | ICD-10-CM | POA: Diagnosis not present

## 2021-01-07 DIAGNOSIS — M25551 Pain in right hip: Secondary | ICD-10-CM | POA: Diagnosis not present

## 2021-01-07 DIAGNOSIS — Z87891 Personal history of nicotine dependence: Secondary | ICD-10-CM | POA: Insufficient documentation

## 2021-01-07 NOTE — Progress Notes (Signed)
[place] OFFICE PROGRESS NOTE  Patient Care Team: [place] Pink, [name], [name], [name]  Cancer Staging No matching staging information was found for the patient.    Oncology History Overview Note  1. Carcinoma of the breast, status post lumpectomy and sentinel lymph node biopsy. June 2007 ER/PR negative, Her-2 negative. PT2N1(mic)M0, STAGE IIb 2. Completed 6 cycles Cytoxan/Taxotere October 2007. Right breast XRT. 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 4. Right mastectomy, May 22, 2015. Location of mass was at 9:00. All the margins were clear. 4 lymph nodes were negative for any tumor. Invasive mammary carcinoma with apocrine features.  Margins are negative. Informal vascular invasion present, rpT2   pNO cMO (triple negative). --------------------------------------------   DIAGNOSIS: Breast cancer  STAGE:  II   ;GOALS: cure  CURRENT/MOST RECENT THERAPY : survielleince    Carcinoma of overlapping sites of right breast in female, estrogen receptor negative ([place])  04/05/2016 Initial Diagnosis   Cancer of overlapping sites of right female breast [facility])      INTERVAL HISTORY:  Julie [name] 80 y.o.  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  In the interim patient was evaluated by PCP-noted to have worsening pain in the right shoulder/scapular area.  Complains of "itchiness".  Denies any headaches.  Denies any nausea vomiting.  She is chronically pain joint pains.   Review of Systems  Constitutional:  Negative for chills, diaphoresis, fever, malaise/fatigue and weight loss.  HENT:  Negative for nosebleeds and  sore throat.   Eyes:  Negative for double vision.  Respiratory:  Negative for cough, hemoptysis, sputum production, shortness of breath and wheezing.   Cardiovascular:  Negative for chest pain, palpitations, orthopnea and leg swelling.  Gastrointestinal:  Negative for abdominal pain, blood in stool, constipation, diarrhea, heartburn, melena, nausea and vomiting.  Genitourinary:  Negative for dysuria, frequency and urgency.  Musculoskeletal:  Positive for back pain, joint pain and myalgias.  Skin: Negative.  Negative for itching and rash.  Neurological:  Negative for dizziness, tingling, focal weakness, weakness and headaches.  Endo/Heme/Allergies:  Does not bruise/bleed easily.  Psychiatric/Behavioral:  Negative for depression. The patient is not nervous/anxious and does not have insomnia.     PAST MEDICAL HISTORY :  Past Medical History:  Diagnosis Date  . Anemia   . Arthritis   . Breast cancer ([place]) 2007   right breast cancer  . Breast cancer ([place]) 2016   recurrent right breast cancer, triple negative  . Cancer [facility] 2007   Right breast  . Cancer of breast ([place]) 04/30/2015  . Diabetes mellitus without complication ([place])   . Family history of breast cancer   . Family history of prostate cancer   . GERD (gastroesophageal reflux disease)   . Hypertension   . Personal history of malignant neoplasm of breast   . Pre-diabetes   . Shortness of breath dyspnea     PAST SURGICAL HISTORY :   Past Surgical History:  Procedure Laterality Date  . ABDOMINAL HYSTERECTOMY    . APPENDECTOMY    . BREAST BIOPSY Right 2007 and 2016   positive  . BREAST EXCISIONAL BIOPSY Left    neg  . BREAST LUMPECTOMY Right  2007  . CATARACT EXTRACTION, BILATERAL Bilateral   . COLONOSCOPY WITH PROPOFOL N/A 06/14/2018   Procedure: COLONOSCOPY WITH PROPOFOL;  Surgeon: Toledo, Benay Pike, MD;  Location: [facility] ENDOSCOPY;  Service: Gastroenterology;  Laterality: N/A;  . JOINT REPLACEMENT Left 2011   knee  .  JOINT REPLACEMENT Right 12/2011   knee  . KNEE SURGERY Right 03/2012  . MASTECTOMY Right 2016  . MASTECTOMY MODIFIED RADICAL Right 05/22/2015   Procedure: MASTECTOMY MODIFIED RADICAL;  Surgeon: Christene Lye, MD;  Location: [facility] ORS;  Service: General;  Laterality: Right;    FAMILY HISTORY :   Family History  Problem Relation Age of Onset  . Breast cancer Mother        dx late 18s  . Heart attack Father   . Heart attack Sister 69  . Prostate cancer Brother        dx 53s  . Dementia Paternal Grandfather     SOCIAL HISTORY:   Social History   Tobacco Use  . Smoking status: Former    Packs/day: 0.75    Years: 2.00    Pack years: 1.50    Types: Cigarettes    Quit date: 05/15/1980    Years since quitting: 40.6  . Smokeless tobacco: Never  Vaping Use  . Vaping Use: Never used  Substance Use Topics  . Alcohol use: No  . Drug use: No    ALLERGIES:  is allergic to gabapentin and penicillin g.  MEDICATIONS:  Current Outpatient Medications  Medication Sig Dispense Refill  . amLODipine (NORVASC) 10 MG tablet Take 1 tablet by mouth daily.    . Cholecalciferol 50 MCG (2000 UT) CAPS Take 1 tablet by mouth daily.    . ciprofloxacin (CIPRO) 250 MG tablet Take 1 tablet by mouth daily.    . cyanocobalamin (,VITAMIN B-12,) 1000 MCG/ML injection Inject 1,000 mcg into the muscle every 30 (thirty) days.    . Potassium Chloride ER 20 MEQ TBCR Take 1 tablet by mouth 2 (two) times daily.    . simvastatin (ZOCOR) 20 MG tablet Take 20 mg by mouth daily at 6 PM.     . triamterene-hydrochlorothiazide (MAXZIDE-25) 37.5-25 MG per tablet Take 1 tablet by mouth daily.      No current facility-administered medications for this visit.    PHYSICAL EXAMINATION: ECOG PERFORMANCE STATUS: 0 - Asymptomatic  BP 116/72   Pulse 75   Temp 98 F (36.7 C) (Tympanic)   Wt 170 lb 3.2 oz (77.2 kg)   BMI 31.13 kg/m   Filed Weights   01/07/21 1428  Weight: 170 lb 3.2 oz (77.2 kg)    Physical  Exam HENT:     Head: Normocephalic and atraumatic.     Mouth/Throat:     Pharynx: No oropharyngeal exudate.  Eyes:     Pupils: Pupils are equal, round, and reactive to light.  Cardiovascular:     Rate and Rhythm: Normal rate and regular rhythm.  Pulmonary:     Effort: No respiratory distress.     Breath sounds: No wheezing.  Abdominal:     General: Bowel sounds are normal. There is no distension.     Palpations: Abdomen is soft. There is no mass.     Tenderness: There is no abdominal tenderness. There is no guarding or rebound.  Musculoskeletal:        General: No tenderness. Normal range of motion.     Cervical back: Normal range of motion and neck supple.  Skin:  General: Skin is warm.  Neurological:     Mental Status: She is alert and oriented to person, place, and time.  Psychiatric:        Mood and Affect: Affect normal.      LABORATORY DATA:  I have reviewed the data as listed    Component Value Date/Time   NA 139 04/23/2020 0952   NA 138 04/25/2015 1210   NA 141 03/15/2012 1210   K 3.4 (L) 04/23/2020 0952   K 3.2 (L) 03/21/2012 1256   CL 102 04/23/2020 0952   CL 102 03/15/2012 1210   CO2 29 04/23/2020 0952   CO2 33 (H) 03/15/2012 1210   GLUCOSE 110 (H) 04/23/2020 0952   GLUCOSE 113 (H) 03/15/2012 1210   BUN 17 04/23/2020 0952   BUN 15 04/25/2015 1210   BUN 10 03/15/2012 1210   CREATININE 0.59 04/23/2020 0952   CREATININE 0.75 03/15/2012 1210   CALCIUM 9.1 04/23/2020 0952   CALCIUM 9.9 03/15/2012 1210   PROT 7.4 04/23/2020 0952   PROT 7.1 04/25/2015 1210   ALBUMIN 4.3 04/23/2020 0952   ALBUMIN 4.7 04/25/2015 1210   AST 19 04/23/2020 0952   ALT 16 04/23/2020 0952   ALKPHOS 91 04/23/2020 0952   BILITOT 0.6 04/23/2020 0952   BILITOT 0.3 04/25/2015 1210   GFRNONAA >60 04/23/2020 0952   GFRNONAA >60 03/15/2012 1210   GFRAA >60 10/24/2019 1005   GFRAA >60 03/15/2012 1210    No results found for: SPEP, UPEP  Lab Results  Component Value Date    WBC 4.5 04/23/2020   NEUTROABS 2.7 04/23/2020   HGB 13.1 04/23/2020   HCT 40.3 04/23/2020   MCV 83.4 04/23/2020   PLT 242 04/23/2020      Chemistry      Component Value Date/Time   NA 139 04/23/2020 0952   NA 138 04/25/2015 1210   NA 141 03/15/2012 1210   K 3.4 (L) 04/23/2020 0952   K 3.2 (L) 03/21/2012 1256   CL 102 04/23/2020 0952   CL 102 03/15/2012 1210   CO2 29 04/23/2020 0952   CO2 33 (H) 03/15/2012 1210   BUN 17 04/23/2020 0952   BUN 15 04/25/2015 1210   BUN 10 03/15/2012 1210   CREATININE 0.59 04/23/2020 0952   CREATININE 0.75 03/15/2012 1210      Component Value Date/Time   CALCIUM 9.1 04/23/2020 0952   CALCIUM 9.9 03/15/2012 1210   ALKPHOS 91 04/23/2020 0952   AST 19 04/23/2020 0952   ALT 16 04/23/2020 0952   BILITOT 0.6 04/23/2020 0952   BILITOT 0.3 04/25/2015 1210       RADIOGRAPHIC STUDIES: I have personally reviewed the radiological images as listed and agreed with the findings in the report. No results found.   ASSESSMENT & PLAN:  Carcinoma of overlapping sites of right breast in female, estrogen receptor negative ([place]) # Right breast ipsilateral breast recurrence triple negative [Nov 2016] s/p mastectomy no adjuvant chemotherapy.  Question recurrence/given bone pain- see discussion below  #Right shoulder/scapular pain- discomfort/right chest wall swelling-?concerns for recurrent malignancy-order bone scan.Clinically suggestive of lymphedema.  We will make a referral to lymphedema clinic.   # Left breast calcifications -surveillance/diagnostic mammo in dec 2021-stable.  Continue surveillance mammogram yearly.  Continue calcium plus vitamin D.  # Right hip pain/arthtis- on prn NSAIDs-STABLE.   # right post mastectomy neuropathic pain-stable.  Onntinue NSAIDs PRN  #Osteopenia : BMD T-score: of -1.9.  Continue calcium plus vitamin D  Will call DISPOSITION: #  bone scan ASAP # referral to Lymphedema PT # follow up as planned-  Dr.B  -----------------------------------------------   # DISPOSITION:  # follow-up in 12  months with labs-MD-cbc/cmp/ca-27-29-Dr.B.   CC: Dr.Hedrick, James/    Orders Placed This Encounter  Procedures  . NM Bone Scan Whole Body    Standing Status:   Future    Standing Expiration Date:   01/07/2022    Order Specific Question:   If indicated for the ordered procedure, I authorize the administration of a radiopharmaceutical per Radiology protocol    Answer:   Yes    Order Specific Question:   Preferred imaging location?    Answer:   [facility]  . AMB referral to rehabilitation    Referral Priority:   Routine    Referral Type:   Consultation    Referral Reason:   Specialty Services Required    Number of Visits Requested:   1   All questions were answered. The patient knows to call the clinic with any problems, questions or concerns.      Cammie Sickle, MD 01/12/2021 6:52 PM

## 2021-01-07 NOTE — Assessment & Plan Note (Addendum)
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  Question recurrence/given bone pain- see discussion below  #Right shoulder/scapular pain- discomfort/right chest wall swelling-?concerns for recurrent malignancy-order bone scan.Clinically suggestive of lymphedema.  We will make a referral to lymphedema clinic.   # Left breast calcifications -surveillance/diagnostic mammo in dec 2021-stable.  Continue surveillance mammogram yearly.  Continue calcium plus vitamin D.  # Right hip pain/arthtis- on prn NSAIDs-STABLE.   # right post mastectomy neuropathic pain-stable.  Onntinue NSAIDs PRN  #Osteopenia : BMD T-score: of -1.9.  Continue calcium plus vitamin D  Will call DISPOSITION: # bone scan ASAP # referral to Lymphedema PT # follow up as planned- Dr.B  -----------------------------------------------   # DISPOSITION:  # follow-up in 12  months with labs-MD-cbc/cmp/ca-27-29-Dr.B.   CC: Dr.Hedrick, James/

## 2021-01-14 ENCOUNTER — Ambulatory Visit
Admission: RE | Admit: 2021-01-14 | Discharge: 2021-01-14 | Disposition: A | Discharge status: Home / Self Care | Payer: Medicare HMO | Source: Ambulatory Visit | Attending: Internal Medicine | Admitting: Internal Medicine

## 2021-01-14 ENCOUNTER — Other Ambulatory Visit: Payer: Self-pay

## 2021-01-14 ENCOUNTER — Encounter
Admission: RE | Admit: 2021-01-14 | Discharge: 2021-01-14 | Disposition: A | Discharge status: Home / Self Care | Payer: Medicare HMO | Source: Ambulatory Visit | Attending: Internal Medicine | Admitting: Internal Medicine

## 2021-01-14 DIAGNOSIS — Z171 Estrogen receptor negative status [ER-]: Secondary | ICD-10-CM | POA: Diagnosis present

## 2021-01-14 DIAGNOSIS — C50811 Malignant neoplasm of overlapping sites of right female breast: Secondary | ICD-10-CM | POA: Diagnosis not present

## 2021-01-14 MED ORDER — TECHNETIUM TC 99M MEDRONATE IV KIT
20.0000 | PACK | Freq: Once | INTRAVENOUS | Status: AC | PRN
  Administered 2021-01-14: 19.05 via INTRAVENOUS

## 2021-01-20 ENCOUNTER — Telehealth: Payer: Self-pay | Admitting: *Deleted

## 2021-01-20 NOTE — Telephone Encounter (Signed)
Patient would like bone scan results if available.

## 2021-01-20 NOTE — Telephone Encounter (Addendum)
Spoke with patient 01/20/21. RN reviewed results with Patient. Reassured patient that results did not demonstrate any signs of cancer. She thanked me for returning her phone call. Patient will follow-up with PT and will see Dr. Jacinto Reap in Oct for routine follow-up.

## 2021-01-20 NOTE — Telephone Encounter (Signed)
Spoke with patient- see RN's phone note

## 2021-01-20 NOTE — Telephone Encounter (Signed)
Patient would like the results of her bone scan if available.

## 2021-01-20 NOTE — Telephone Encounter (Signed)
IMPRESSION: No findings suspicious for osseous metastatic disease.  I will reach out to the patient today to inform pt of results

## 2021-01-20 NOTE — Telephone Encounter (Signed)
Message left on patient's voice mail to have patient return our phone call. Cecille Rubin- if patient returns call today. Ok to tell patient results are normal.

## 2021-01-21 ENCOUNTER — Telehealth: Payer: Self-pay | Admitting: *Deleted

## 2021-01-27 NOTE — Telephone Encounter (Signed)
Opened in error

## 2021-02-02 ENCOUNTER — Ambulatory Visit: Payer: Medicare HMO | Attending: Internal Medicine | Admitting: Occupational Therapy

## 2021-02-02 DIAGNOSIS — M25511 Pain in right shoulder: Secondary | ICD-10-CM | POA: Diagnosis present

## 2021-02-02 DIAGNOSIS — I972 Postmastectomy lymphedema syndrome: Secondary | ICD-10-CM

## 2021-02-02 DIAGNOSIS — M25611 Stiffness of right shoulder, not elsewhere classified: Secondary | ICD-10-CM

## 2021-02-02 NOTE — Therapy (Signed)
[facility] [address], [place], [address] Phone: [phone]   Fax:  [phone]  Occupational Therapy Evaluation  Patient Details  Name: Julie [name] MRN: [id number] Date of Birth: 1941/03/23 Referring Provider (OT): Dr Rogue Bussing   Encounter Date: 02/02/2021   OT End of Session - 02/02/21 1257     Visit Number 1    Number of Visits 8    Date for OT Re-Evaluation 03/30/21    OT Start Time 0815    OT Stop Time 0901    OT Time Calculation (min) 46 min    Activity Tolerance Patient tolerated treatment well    Behavior During Therapy [facility] for tasks assessed/performed             Past Medical History:  Diagnosis Date   Anemia    Arthritis    Breast cancer ([place]) 2007   right breast cancer   Breast cancer ([place]) 2016   recurrent right breast cancer, triple negative   Cancer ([place]) 2007   Right breast   Cancer of breast ([place]) 04/30/2015   Diabetes mellitus without complication ([place])    Family history of breast cancer    Family history of prostate cancer    GERD (gastroesophageal reflux disease)    Hypertension    Personal history of malignant neoplasm of breast    Pre-diabetes    Shortness of breath dyspnea     Past Surgical History:  Procedure Laterality Date   ABDOMINAL HYSTERECTOMY     APPENDECTOMY     BREAST BIOPSY Right 2007 and 2016   positive   BREAST EXCISIONAL BIOPSY Left    neg   BREAST LUMPECTOMY Right 2007   CATARACT EXTRACTION, BILATERAL Bilateral    COLONOSCOPY WITH PROPOFOL N/A 06/14/2018   Procedure: COLONOSCOPY WITH PROPOFOL;  Surgeon: Toledo, Benay Pike, MD;  Location: [facility] ENDOSCOPY;  Service: Gastroenterology;  Laterality: N/A;   JOINT REPLACEMENT Left 2011   knee   JOINT REPLACEMENT Right 12/2011   knee   KNEE SURGERY Right 03/2012   MASTECTOMY Right 2016   MASTECTOMY MODIFIED RADICAL Right 05/22/2015   Procedure: MASTECTOMY MODIFIED RADICAL;  Surgeon:  Christene Lye, MD;  Location: [facility] ORS;  Service: General;  Laterality: Right;    There were no vitals filed for this visit.   Subjective Assessment - 02/02/21 1234     Subjective  The pain started about 2 months ago  at the back of my R shoulder when I do vacuuming, mopping or carry groceries- and some swelling under my arm and tightness under my arm and chest - stiffness more in the morning- but I have some of that same pain in my L shoulder    Pertinent History Pt had R breast CA in 2007 with lumpectomy and then recurrence in 2016 with mastectomy - had radiation - had no issues until about 2 months ago when she started having some pain over the upper back R worse than L after mopping , vacuuming and groceries - and swelling at times under her R arm and tightnes mostly under the R axilla and chest    Patient Stated Goals Want the pain better in my R upper back  and shoulder    Currently in Pain? No/denies               [facility] OT Assessment - 02/02/21 0001       Assessment   Medical Diagnosis R shoulder /upper trap  pain    Referring Provider (OT) Dr Rogue Bussing    Onset Date/Surgical Date --   2016   Hand Dominance Right      Precautions   Precaution Comments Lymphedema for R UE      Home  Environment   Lives With Spouse      Prior Function   Vocation Retired    Leisure do own housework, cleaning , plants in sunroom , walk      AROM   Right Shoulder Flexion 135 Degrees    Right Shoulder ABduction 135 Degrees    Left Shoulder Flexion 135 Degrees    Left Shoulder ABduction 135 Degrees    Cervical - Right Side Bend 10    Cervical - Left Side Bend 10    Cervical - Right Rotation 40    Cervical - Left Rotation 30              LYMPHEDEMA/ONCOLOGY QUESTIONNAIRE - 02/02/21 0001       Right Upper Extremity Lymphedema   15 cm Proximal to Olecranon Process 37.5 cm    10 cm Proximal to Olecranon Process 36 cm    Olecranon Process 27 cm    15 cm Proximal to Ulnar  Styloid Process 24 cm    10 cm Proximal to Ulnar Styloid Process 20.5 cm    Just Proximal to Ulnar Styloid Process 16.8 cm    Across Hand at [employer] 19 cm    At [place] of 2nd Digit 6 cm    At [facility] of [place]umb 6.8 cm      Left Upper Extremity Lymphedema   10 cm Proximal to Olecranon Process 37 cm    Olecranon Process 27 cm    15 cm Proximal to Ulnar Styloid Process 24.4 cm    10 cm Proximal to Ulnar Styloid Process 21.2 cm    Just Proximal to Ulnar Styloid Process 17 cm    Across Hand at [employer] 19.4 cm    At [place] of 2nd Digit 6.2 cm    At [facility] of Thumb 7 cm                  Pt to have family massage upper traps   After shower or some heat on neck - 2-3 x day  Shoulder flexion AAROM on wall - wall slides bilateral - 10 reps hold 5 sec  Cervical rotation , lat flexion - both ways - 10 reps hold 5 sec  Scalenes stretch 8 reps hold 5 sec - bilateral   Scapula squeezes 4 x day - 10 reps            OT Education - 02/02/21 1257     Education Details findings of eval and HEP    Person(s) Educated Patient    Methods Explanation;Demonstration;Tactile cues;Verbal cues;Handout    Comprehension Verbal cues required;Returned demonstration;Verbalized understanding                 OT Long Term Goals - 02/02/21 1635       OT LONG TERM GOAL #1   Title Pt upper traps tightness and pain decrease for cervical , rotation and lateral flexion increase with 20 degrees to vacuum with less pain    Baseline upper traps tight and pain /tender- cervical lat flexion 10 bilat, rotation L 30, R 40 - pain with vacuuming, mopping 4/10    Time 4    Period Weeks    Status New  Target Date 03/02/21      OT LONG TERM GOAL #2   Title Pt to ind in HEP to keep pain in R upper traps decrease to less than 2/10 with mopping, vacuuming    Baseline pain increase to 4/10 - tender and tight - pain R upper taps worse than L - with cleaning activities    Time 8    Period Weeks     Status New    Target Date 03/30/21      OT LONG TERM GOAL #3   Title Monitor lymphedema in R thoracic and fit if needed with appropriate compression garment    Baseline Pt report at times pocket of swelling/ lymphedema under R axilla - on thoracic - R UE circumference WNL compare to L    Time 4    Period Weeks    Status New    Target Date 03/02/21                   Plan - 02/02/21 1628     Clinical Impression Statement Pt present at OT eval with diagnosis of R breast CA in 2007 and recurrent in 2016 with mastectomy on R - scar tissue not adhere but tightness in axilla and chest with over head ROM - pt refer to OT  with pain or discomfort over R upper traps and posterior shoulder when doing cleaning activities at home like vacuuming, mopping - R worse than L. Pt with bilateral upper traps tightness - with decrease cervical rotation and lateral flexion - pain over upper traps - tenderness. Stiffness or tightness under R arm more than L and at times feels pocket of swelling or lymphedema on ther R thoracic - Pt R UE circumference do now show increase - pt could be stage 1 lymphedema- pt ed on lymphedema and to monitor with what activities swelling increase - pt AROM in bilateral shoulder WFL - do have per pt arthritis and stiffness in the am - pt can benefit from OT services to decrease tightness, stiffness and increase cervical ROM , and decrease pain in R upper traps/ posterior shoulder -    OT Occupational Profile and History Problem Focused Assessment - Including review of records relating to presenting problem    Occupational performance deficits (Please refer to evaluation for details): IADL's;Other    Body Structure / Function / Physical Skills ADL;Strength;Pain;Edema;IADL;Flexibility;Decreased knowledge of precautions;ROM    Rehab Potential Good    Clinical Decision Making Limited treatment options, no task modification necessary    Comorbidities Affecting Occupational  Performance: None    Modification or Assistance to Complete Evaluation  No modification of tasks or assist necessary to complete eval    OT Frequency 1x / week    OT Duration 8 weeks    OT Treatment/Interventions Self-care/ADL training;Manual Therapy;Patient/family education;Passive range of motion;Therapeutic exercise    Consulted and Agree with Plan of Care Patient             Patient will benefit from skilled therapeutic intervention in order to improve the following deficits and impairments:   Body Structure / Function / Physical Skills: ADL, Strength, Pain, Edema, IADL, Flexibility, Decreased knowledge of precautions, ROM       Visit Diagnosis: Stiffness of right shoulder, not elsewhere classified - Plan: Ot plan of care cert/re-cert  Acute pain of right shoulder - Plan: Ot plan of care cert/re-cert  Postmastectomy lymphedema syndrome - Plan: Ot plan of care cert/re-cert    Problem List Patient  Active Problem List   Diagnosis Date Noted   Genetic testing 03/23/2019   Family history of breast cancer    Family history of prostate cancer    Cancer ([place]) 01/09/2019   Pedal edema 03/28/2018   SOB (shortness of breath) 03/28/2018   Carcinoma of overlapping sites of right breast in female, estrogen receptor negative ([place]) 04/05/2016   Anemia 04/30/2015   Arthritis 04/30/2015   B12 deficiency 04/30/2015   Colon adenoma 04/30/2015   Colon polyp 04/30/2015   HLD (hyperlipidemia) 04/30/2015   Hypertension 04/30/2015   Sick sinus syndrome ([place]) 07/26/2014    Takeia Ciaravino OTR/L,CLT 02/02/2021, 4:42 PM  [facility] [address], [place], [address] Phone: [phone]   Fax:  [phone]  Name: TARAJA SHANOR MRN: [id number] Date of Birth: 1940-11-09

## 2021-02-16 ENCOUNTER — Ambulatory Visit: Payer: Medicare HMO | Attending: Internal Medicine | Admitting: Occupational Therapy

## 2021-02-16 DIAGNOSIS — I972 Postmastectomy lymphedema syndrome: Secondary | ICD-10-CM | POA: Diagnosis present

## 2021-02-16 DIAGNOSIS — M25611 Stiffness of right shoulder, not elsewhere classified: Secondary | ICD-10-CM | POA: Insufficient documentation

## 2021-02-16 DIAGNOSIS — M25511 Pain in right shoulder: Secondary | ICD-10-CM | POA: Diagnosis present

## 2021-02-16 NOTE — Therapy (Signed)
[facility] [address], [place], [address] Phone: [phone]   Fax:  [phone]  Occupational Therapy Treatment  Patient Details  Name: SAMEEHA WICHMANN MRN: [id number] Date of Birth: [birth year] Referring Provider (OT): Dr Rogue Bussing   Encounter Date: 02/16/2021   OT End of Session - 02/16/21 1426     Visit Number 2    Number of Visits 8    Date for OT Re-Evaluation 03/30/21    OT Start Time 0820    OT Stop Time 0902    OT Time Calculation (min) 42 min    Activity Tolerance Patient tolerated treatment well    Behavior During Therapy [facility] for tasks assessed/performed             Past Medical History:  Diagnosis Date   Anemia    Arthritis    Breast cancer ([place]) 2007   right breast cancer   Breast cancer ([place]) 2016   recurrent right breast cancer, triple negative   Cancer ([place]) 2007   Right breast   Cancer of breast ([place]) 04/30/2015   Diabetes mellitus without complication ([place])    Family history of breast cancer    Family history of prostate cancer    GERD (gastroesophageal reflux disease)    Hypertension    Personal history of malignant neoplasm of breast    Pre-diabetes    Shortness of breath dyspnea     Past Surgical History:  Procedure Laterality Date   ABDOMINAL HYSTERECTOMY     APPENDECTOMY     BREAST BIOPSY Right 2007 and 2016   positive   BREAST EXCISIONAL BIOPSY Left    neg   BREAST LUMPECTOMY Right 2007   CATARACT EXTRACTION, BILATERAL Bilateral    COLONOSCOPY WITH PROPOFOL N/A 06/14/2018   Procedure: COLONOSCOPY WITH PROPOFOL;  Surgeon: Toledo, Benay Pike, MD;  Location: [facility];  Service: Gastroenterology;  Laterality: N/A;   JOINT REPLACEMENT Left 2011   knee   JOINT REPLACEMENT Right 12/2011   knee   KNEE SURGERY Right 03/2012   MASTECTOMY Right 2016   MASTECTOMY MODIFIED RADICAL Right 05/22/2015   Procedure: MASTECTOMY MODIFIED RADICAL;  Surgeon: Christene Lye, MD;  Location: [facility] ORS;  Service: General;  Laterality: Right;    There were no vitals filed for this visit.   Subjective Assessment - 02/16/21 1424     Subjective  Doing much better- no pain at my upper back or R shoulder -only some tightness or pull under my arm - after I done some vacuuming or cleaning the bathroom - and I have done that this past Sat - done ther exercises and daughter helped me with the massage on my neck muscle -    Pertinent History Pt had R breast CA in 2007 with lumpectomy and then recurrence in 2016 with mastectomy - had radiation - had no issues until about 2 months ago when she started having some pain over the upper back R worse than L after mopping , vacuuming and groceries - and swelling at times under her R arm and tightnes mostly under the R axilla and chest    Patient Stated Goals Want the pain better in my R upper back  and shoulder    Currently in Pain? Yes    Pain Score 1     Pain Location Axilla    Pain Orientation Right    Pain Descriptors / Indicators Tightness    Pain Type Acute pain  Pain Onset More than a month ago    Pain Frequency Intermittent                [facility] OT Assessment - 02/16/21 0001       AROM   Right Shoulder Flexion 145 Degrees    Right Shoulder ABduction 135 Degrees    Left Shoulder Flexion 145 Degrees    Left Shoulder ABduction 135 Degrees    Cervical - Right Side Bend 30    Cervical - Left Side Bend 22    Cervical - Right Rotation 40    Cervical - Left Rotation 40             LYMPHEDEMA/ONCOLOGY QUESTIONNAIRE - 02/16/21 0001       Right Upper Extremity Lymphedema   15 cm Proximal to Olecranon Process 37.5 cm    10 cm Proximal to Olecranon Process 36 cm    Olecranon Process 27 cm    15 cm Proximal to Ulnar Styloid Process 24 cm    10 cm Proximal to Ulnar Styloid Process 20.5 cm    Just Proximal to Ulnar Styloid Process 17 cm    Across Hand at [employer] 18.5 cm    At [place] of 2nd Digit  6 cm    At [facility] of [place] 6.8 cm               Pt showed increase cervical and R shoulder AROM   Less tenderness over upper traps bilateral  Daughter assisting pt with massage or soft tissue  See flow sheet   Pt to have family cont to  massage upper traps   After shower or some heat on neck - 2-3 x day Shoulder flexion AAROM on wall - wall slides bilateral - 10 reps hold 5 sec Cervical rotation , lat flexion - both ways - 10 reps hold 5 sec Scalenes stretch 8 reps hold 5 sec - bilateral   Scapula squeezes 4 x day - 10 reps  And then scar massage over mid scar on chest with arm over head on head - pt ed on scar mobs - and done some kinesiotape - parallel - 20% pull and 2 across 100% pull 3 - to facilitate scar mobs during day - for 3 days  Ed on precautions                 OT Education - 02/16/21 1426     Education Details progress and changes to HEP    Person(s) Educated Patient    Methods Explanation;Demonstration;Tactile cues;Verbal cues;Handout    Comprehension Verbal cues required;Returned demonstration;Verbalized understanding                 OT Long Term Goals - 02/02/21 1635       OT LONG TERM GOAL #1   Title Pt upper traps tightness and pain decrease for cervical , rotation and lateral flexion increase with 20 degrees to vacuum with less pain    Baseline upper traps tight and pain /tender- cervical lat flexion 10 bilat, rotation L 30, R 40 - pain with vacuuming, mopping 4/10    Time 4    Period Weeks    Status New    Target Date 03/02/21      OT LONG TERM GOAL #2   Title Pt to ind in HEP to keep pain in R upper traps decrease to less than 2/10 with mopping, vacuuming    Baseline pain increase to 4/10 - tender  and tight - pain R upper taps worse than L - with cleaning activities    Time 8    Period Weeks    Status New    Target Date 03/30/21      OT LONG TERM GOAL #3   Title Monitor lymphedema in R thoracic and fit if needed with  appropriate compression garment    Baseline Pt report at times pocket of swelling/ lymphedema under R axilla - on thoracic - R UE circumference WNL compare to L    Time 4    Period Weeks    Status New    Target Date 03/02/21                   Plan - 02/16/21 1426     Clinical Impression Statement Pt with diagnosis of R breast CA in 2007 and recurrent in 2016 with mastectomy on R - scar tissue not adhere but tightness in axilla and chest with over head ROM - pt refer to OT  with pain or discomfort over R upper traps and posterior shoulder when doing cleaning activities at home like vacuuming, mopping - R worse than L. Pt showed bilateral upper traps tightness - with decrease cervical rotation and lateral flexion - pain over upper traps - tenderness. Improved greatly in tenderness in upper traps ,and AROM for cervical - no pain reported in upper R back and shoulder- still some stiffness and tightness under R arm with over head or cleaning activities. Circumference still WNL compare to the L U. Pt to cont to do same HEP for AAROM shoulder and cervical -as well as scar massage  with arm over head. - cont to monitor for lymphedema and to monitor with what activities swelling increase - but appear to stay same - ? tissue after mastectomy. - do have per pt arthritis and stiffness in the am - pt can benefit from cont  OT services to decrease tightness, stiffness and increase cervical ROM , and decrease pain in R upper traps/ posterior shoulder/axilla.    OT Occupational Profile and History Problem Focused Assessment - Including review of records relating to presenting problem    Occupational performance deficits (Please refer to evaluation for details): IADL's;Other    Body Structure / Function / Physical Skills ADL;Strength;Pain;Edema;IADL;Flexibility;Decreased knowledge of precautions;ROM    Rehab Potential Good    Clinical Decision Making Limited treatment options, no task modification necessary     Comorbidities Affecting Occupational Performance: None    Modification or Assistance to Complete Evaluation  No modification of tasks or assist necessary to complete eval    OT Frequency 1x / week    OT Duration 8 weeks    OT Treatment/Interventions Self-care/ADL training;Manual Therapy;Patient/family education;Passive range of motion;Therapeutic exercise    Consulted and Agree with Plan of Care Patient             Patient will benefit from skilled therapeutic intervention in order to improve the following deficits and impairments:   Body Structure / Function / Physical Skills: ADL, Strength, Pain, Edema, IADL, Flexibility, Decreased knowledge of precautions, ROM       Visit Diagnosis: Stiffness of right shoulder, not elsewhere classified  Acute pain of right shoulder  Postmastectomy lymphedema syndrome    Problem List Patient Active Problem List   Diagnosis Date Noted   Genetic testing 03/23/2019   Family history of breast cancer    Family history of prostate cancer    Cancer ([place]) 01/09/2019   Pedal  edema 03/28/2018   SOB (shortness of breath) 03/28/2018   Carcinoma of overlapping sites of right breast in female, estrogen receptor negative ([place]) 04/05/2016   Anemia 04/30/2015   Arthritis 04/30/2015   B12 deficiency 04/30/2015   Colon adenoma 04/30/2015   Colon polyp 04/30/2015   HLD (hyperlipidemia) 04/30/2015   Hypertension 04/30/2015   Sick sinus syndrome ([place]) 07/26/2014    Barney Russomanno OTR?L,CLT 02/16/2021, 2:32 PM  [facility] [address], [place], [address] Phone: [phone]   Fax:  [phone]  Name: KAMARI BOTTIGLIERI MRN: [id number] Date of Birth: 1941/02/12

## 2021-03-02 ENCOUNTER — Ambulatory Visit: Payer: Medicare HMO | Admitting: Occupational Therapy

## 2021-03-02 DIAGNOSIS — M25511 Pain in right shoulder: Secondary | ICD-10-CM

## 2021-03-02 DIAGNOSIS — M25611 Stiffness of right shoulder, not elsewhere classified: Secondary | ICD-10-CM | POA: Diagnosis not present

## 2021-03-02 DIAGNOSIS — I972 Postmastectomy lymphedema syndrome: Secondary | ICD-10-CM

## 2021-03-02 NOTE — Therapy (Signed)
[facility] [address], [place], [address] Phone: [phone]   Fax:  [phone]  Occupational Therapy Treatment  Patient Details  Name: Julie [name] MRN: [id number] Date of Birth: Jul 01, 1941 Referring Provider (OT): Dr Rogue Bussing   Encounter Date: 03/02/2021   OT End of Session - 03/02/21 0842     Visit Number 3    Number of Visits 8    Date for OT Re-Evaluation 03/30/21    OT Start Time 0814    OT Stop Time 0839    OT Time Calculation (min) 25 min    Activity Tolerance Patient tolerated treatment well    Behavior During Therapy [facility] for tasks assessed/performed             Past Medical History:  Diagnosis Date   Anemia    Arthritis    Breast cancer ([place]) 2007   right breast cancer   Breast cancer ([place]) 2016   recurrent right breast cancer, triple negative   Cancer ([place]) 2007   Right breast   Cancer of breast ([place]) 04/30/2015   Diabetes mellitus without complication ([place])    Family history of breast cancer    Family history of prostate cancer    GERD (gastroesophageal reflux disease)    Hypertension    Personal history of malignant neoplasm of breast    Pre-diabetes    Shortness of breath dyspnea     Past Surgical History:  Procedure Laterality Date   ABDOMINAL HYSTERECTOMY     APPENDECTOMY     BREAST BIOPSY Right 2007 and 2016   positive   BREAST EXCISIONAL BIOPSY Left    neg   BREAST LUMPECTOMY Right 2007   CATARACT EXTRACTION, BILATERAL Bilateral    COLONOSCOPY WITH PROPOFOL N/A 06/14/2018   Procedure: COLONOSCOPY WITH PROPOFOL;  Surgeon: Toledo, Benay Pike, MD;  Location: [facility] ENDOSCOPY;  Service: Gastroenterology;  Laterality: N/A;   JOINT REPLACEMENT Left 2011   knee   JOINT REPLACEMENT Right 12/2011   knee   KNEE SURGERY Right 03/2012   MASTECTOMY Right 2016   MASTECTOMY MODIFIED RADICAL Right 05/22/2015   Procedure: MASTECTOMY MODIFIED RADICAL;  Surgeon:  Christene Lye, MD;  Location: [facility] ORS;  Service: General;  Laterality: Right;    There were no vitals filed for this visit.   Subjective Assessment - 03/02/21 0841     Subjective  Doing better- no pain and the tightness under my arm and in chest better- swelling pocket under my arm stays the same - do not increase after using my arm a lot like vacuuming    Pertinent History Pt had R breast CA in 2007 with lumpectomy and then recurrence in 2016 with mastectomy - had radiation - had no issues until about 2 months ago when she started having some pain over the upper back R worse than L after mopping , vacuuming and groceries - and swelling at times under her R arm and tightnes mostly under the R axilla and chest    Patient Stated Goals Want the pain better in my R upper back  and shoulder    Currently in Pain? No/denies                [facility] OT Assessment - 03/02/21 0001       AROM   Right Shoulder Flexion 145 Degrees    Right Shoulder ABduction 145 Degrees    Left Shoulder Flexion 145 Degrees    Left  Shoulder ABduction 145 Degrees    Cervical - Right Side Bend 25    Cervical - Left Side Bend 25    Cervical - Right Rotation 45    Cervical - Left Rotation 50             LYMPHEDEMA/ONCOLOGY QUESTIONNAIRE - 03/02/21 0001       Right Upper Extremity Lymphedema   10 cm Proximal to Olecranon Process 36 cm    Olecranon Process 27 cm    15 cm Proximal to Ulnar Styloid Process 24 cm    10 cm Proximal to Ulnar Styloid Process 20.5 cm    Just Proximal to Ulnar Styloid Process 16.5 cm                Pt showed increase cervical and R shoulder AROM   still some tenderness over upper traps bilateral  Daughter  was not able to do soft tissue massage this past week -but prior helped if she can - reinforce for her to get family assisting pt with massage or soft tissue at upper traps  See flow sheet    Pt to have family cont to  massage upper traps   After shower or  some heat on neck - 2-3 x day Shoulder flexion  and ABD AAROM on wall - wall slides bilateral - 10 reps hold 5 sec Cervical rotation , lat flexion - both ways - 10 reps hold 5 sec Scalenes stretch 8 reps hold 5 sec - bilateral   Scapula squeezes 4 x day - 10 reps  And then scar massage over mid scar on chest with arm over head on head - pt ed on scar mobs  Pt pocket or flap under R axilla or arm staying same -and did not increase with cleaning activities - and R UE circumference staying same- WNL compare to L             OT Education - 03/02/21 0842     Education Details progress and changes to HEP    Person(s) Educated Patient    Methods Explanation;Demonstration;Tactile cues;Verbal cues;Handout    Comprehension Verbal cues required;Returned demonstration;Verbalized understanding                 OT Long Term Goals - 03/02/21 0849       OT LONG TERM GOAL #1   Title Pt upper traps tightness and pain decrease for cervical , rotation and lateral flexion increase with 20 degrees to vacuum with less pain    Baseline upper traps tight and pain /tender- cervical lat flexion 10 bilat, rotation L 30, R 40 - pain with vacuuming, mopping 4/10- NOW no pain only tenderness in upper traps- daughter did not help with soft tissue -but if she did like the previous week - it is better- lat fexion 25-30 degrees , rotation 45    Status Achieved      OT LONG TERM GOAL #2   Title Pt to ind in HEP to keep pain in R upper traps decrease to less than 2/10 with mopping, vacuuming    Baseline pain increase to 4/10 - tender and tight - pain R upper taps worse than L - with cleaning activities  NOW no pain with using R UE , cleaning    Status Achieved      OT LONG TERM GOAL #3   Title Monitor lymphedema in R thoracic and fit if needed with appropriate compression garment    Baseline Pt report at  times pocket of swelling/ lymphedema under R axilla - on thoracic - R UE circumference WNL compare to  L  NOW staying the same even cleaning activities and using her arm normally    Status Achieved                   Plan - 03/02/21 0843     Clinical Impression Statement Pt with diagnosis of R breast CA in 2007 and recurrent in 2016 with mastectomy on R - at eval had scar tissue with tightness in axilla and chest with over head ROM - pt refer to OT  with pain or discomfort over R upper traps and posterior shoulder when doing cleaning activities at home like vacuuming, mopping - R worse than L. Pt showed bilateral upper traps tightness - with decrease cervical rotation and lateral flexion - pain over upper traps - tenderness. WIth  2 sessions pt Improved greatly in  decrease pain, tightness with increase AROM for cervical and over head shoulder flexion and ABD - no pain reported in upper R back and shoulder- still some tenderness this time over upper traps - report daughter could not do any massaging this past week - reinforce soft tissue at home with family assistance- and cont with HEP for AAROM for shoulder flexion and ABD, cervical AAROM -and soft tissue massage. Circumference still WNL compare to the L U. Pt to cont to monitor for lymphedema and to monitor with what activities swelling increase - but appear to stay same - ? tissue pocket after mastectomy. - Per pt she have arthritis and stiffness in the am - pt feels she can cont at home with her HEP -and contact me if she do find increase swelling under the arm/axilla or in the R UE.    OT Occupational Profile and History Problem Focused Assessment - Including review of records relating to presenting problem    Occupational performance deficits (Please refer to evaluation for details): IADL's;Other    Body Structure / Function / Physical Skills ADL;Strength;Pain;Edema;IADL;Flexibility;Decreased knowledge of precautions;ROM    Rehab Potential Good    Clinical Decision Making Limited treatment options, no task modification necessary     Comorbidities Affecting Occupational Performance: None    Modification or Assistance to Complete Evaluation  No modification of tasks or assist necessary to complete eval    OT Frequency 1x / week    OT Duration 8 weeks    OT Treatment/Interventions Self-care/ADL training;Manual Therapy;Patient/family education;Passive range of motion;Therapeutic exercise    Consulted and Agree with Plan of Care Patient             Patient will benefit from skilled therapeutic intervention in order to improve the following deficits and impairments:   Body Structure / Function / Physical Skills: ADL, Strength, Pain, Edema, IADL, Flexibility, Decreased knowledge of precautions, ROM       Visit Diagnosis: Stiffness of right shoulder, not elsewhere classified  Acute pain of right shoulder  Postmastectomy lymphedema syndrome    Problem List Patient Active Problem List   Diagnosis Date Noted   Genetic testing 03/23/2019   Family history of breast cancer    Family history of prostate cancer    Cancer ([place]) 01/09/2019   Pedal edema 03/28/2018   SOB (shortness of breath) 03/28/2018   Carcinoma of overlapping sites of right breast in female, estrogen receptor negative ([place]) 04/05/2016   Anemia 04/30/2015   Arthritis 04/30/2015   B12 deficiency 04/30/2015   Colon adenoma 04/30/2015  Colon polyp 04/30/2015   HLD (hyperlipidemia) 04/30/2015   Hypertension 04/30/2015   Sick sinus syndrome ([place]) 07/26/2014    Rosalyn Gess OTR/l,CLT 03/02/2021, 8:53 AM  [facility] [address] [place], [address] Phone: [phone]   Fax:  [phone]  Name: NAMIRAH SANTAANA MRN: [id number] Date of Birth: September 09, 1940

## 2021-04-22 ENCOUNTER — Other Ambulatory Visit: Payer: Self-pay | Admitting: *Deleted

## 2021-04-22 DIAGNOSIS — C50811 Malignant neoplasm of overlapping sites of right female breast: Secondary | ICD-10-CM

## 2021-04-22 DIAGNOSIS — Z171 Estrogen receptor negative status [ER-]: Secondary | ICD-10-CM

## 2021-04-23 ENCOUNTER — Inpatient Hospital Stay: Payer: Medicare HMO | Admitting: Internal Medicine

## 2021-04-23 ENCOUNTER — Inpatient Hospital Stay: Payer: Medicare HMO | Attending: Internal Medicine

## 2021-04-23 ENCOUNTER — Other Ambulatory Visit: Payer: Self-pay

## 2021-04-23 ENCOUNTER — Encounter: Payer: Self-pay | Admitting: Internal Medicine

## 2021-04-23 VITALS — BP 142/58 | HR 60 | Temp 97.4°F | Resp 18 | Ht 62.0 in | Wt 169.0 lb

## 2021-04-23 DIAGNOSIS — Z171 Estrogen receptor negative status [ER-]: Secondary | ICD-10-CM | POA: Diagnosis not present

## 2021-04-23 DIAGNOSIS — Z9011 Acquired absence of right breast and nipple: Secondary | ICD-10-CM | POA: Insufficient documentation

## 2021-04-23 DIAGNOSIS — Z87891 Personal history of nicotine dependence: Secondary | ICD-10-CM | POA: Insufficient documentation

## 2021-04-23 DIAGNOSIS — M8580 Other specified disorders of bone density and structure, unspecified site: Secondary | ICD-10-CM | POA: Insufficient documentation

## 2021-04-23 DIAGNOSIS — Z9221 Personal history of antineoplastic chemotherapy: Secondary | ICD-10-CM | POA: Diagnosis not present

## 2021-04-23 DIAGNOSIS — C50811 Malignant neoplasm of overlapping sites of right female breast: Secondary | ICD-10-CM | POA: Diagnosis not present

## 2021-04-23 DIAGNOSIS — M25511 Pain in right shoulder: Secondary | ICD-10-CM | POA: Diagnosis not present

## 2021-04-23 LAB — COMPREHENSIVE METABOLIC PANEL
ALT: 17 U/L (ref 0–44)
AST: 20 U/L (ref 15–41)
Albumin: 4.4 g/dL (ref 3.5–5.0)
Alkaline Phosphatase: 82 U/L (ref 38–126)
Anion gap: 9 (ref 5–15)
BUN: 16 mg/dL (ref 8–23)
CO2: 28 mmol/L (ref 22–32)
Calcium: 9.2 mg/dL (ref 8.9–10.3)
Chloride: 100 mmol/L (ref 98–111)
Creatinine, Ser: 0.74 mg/dL (ref 0.44–1.00)
GFR, Estimated: >60 mL/min (ref >60)
Glucose, Bld: 102 mg/dL — ABNORMAL HIGH (ref 70–99)
Potassium: 3.8 mmol/L (ref 3.5–5.1)
Sodium: 137 mmol/L (ref 135–145)
Total Bilirubin: 0.4 mg/dL (ref 0.3–1.2)
Total Protein: 7.5 g/dL (ref 6.5–8.1)

## 2021-04-23 LAB — CBC WITH DIFFERENTIAL/PLATELET
Abs Immature Granulocytes: 0.02 10*3/uL (ref 0.00–0.07)
Basophils Absolute: 0 10*3/uL (ref 0.0–0.1)
Basophils Relative: 0 %
Eosinophils Absolute: 0.1 10*3/uL (ref 0.0–0.5)
Eosinophils Relative: 3 %
HCT: 39.7 % (ref 36.0–46.0)
Hemoglobin: 12.6 g/dL (ref 12.0–15.0)
Immature Granulocytes: 0 %
Lymphocytes Relative: 23 %
Lymphs Abs: 1.2 10*3/uL (ref 0.7–4.0)
MCH: 26.7 pg (ref 26.0–34.0)
MCHC: 31.7 g/dL (ref 30.0–36.0)
MCV: 84.1 fL (ref 80.0–100.0)
Monocytes Absolute: 0.5 10*3/uL (ref 0.1–1.0)
Monocytes Relative: 11 %
Neutro Abs: 3.2 10*3/uL (ref 1.7–7.7)
Neutrophils Relative %: 63 %
Platelets: 250 10*3/uL (ref 150–400)
RBC: 4.72 MIL/uL (ref 3.87–5.11)
RDW: 14.6 % (ref 11.5–15.5)
WBC: 5 10*3/uL (ref 4.0–10.5)
nRBC: 0 % (ref 0.0–0.2)

## 2021-04-23 NOTE — Progress Notes (Signed)
[facility] OFFICE PROGRESS NOTE  Patient Care Team: [place] Pink, [name], [name], [name]  Cancer Staging No matching staging information was found for the patient.    Oncology History Overview Note  1. Carcinoma of the breast, status post lumpectomy and sentinel lymph node biopsy. June 2007 ER/PR negative, Her-2 negative. PT2N1(mic)M0, STAGE IIb 2. Completed 6 cycles Cytoxan/Taxotere October 2007. Right breast XRT. 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 4. Right mastectomy, May 22, 2015. Location of mass was at 9:00. All the margins were clear. 4 lymph nodes were negative for any tumor. Invasive mammary carcinoma with apocrine features.  Margins are negative. Informal vascular invasion present, rpT2   pNO cMO (triple negative). --------------------------------------------   DIAGNOSIS: Breast cancer  STAGE:  II   ;GOALS: cure  CURRENT/MOST RECENT THERAPY : survielleince    Carcinoma of overlapping sites of right breast in female, estrogen receptor negative ([place])  04/05/2016 Initial Diagnosis   Cancer of overlapping sites of right female breast ([place])     INTERVAL HISTORY: Ambulating independently.  Alone.  Julie [name] 80 y.o.  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  In the interim patient was evaluated by a bone scan for ongoing right shoulder pain.  Bone scan was negative for any metastatic disease.  Patient also evaluated by lymphedema clinic-notable improvement of the swelling and pain.  Denies any headaches.  Denies any nausea vomiting.  She is chronically pain joint pains.   Review of Systems   Constitutional:  Negative for chills, diaphoresis, fever, malaise/fatigue and weight loss.  HENT:  Negative for nosebleeds and sore throat.   Eyes:  Negative for double vision.  Respiratory:  Negative for cough, hemoptysis, sputum production, shortness of breath and wheezing.   Cardiovascular:  Negative for chest pain, palpitations, orthopnea and leg swelling.  Gastrointestinal:  Negative for abdominal pain, blood in stool, constipation, diarrhea, heartburn, melena, nausea and vomiting.  Genitourinary:  Negative for dysuria, frequency and urgency.  Musculoskeletal:  Positive for back pain, joint pain and myalgias.  Skin: Negative.  Negative for itching and rash.  Neurological:  Negative for dizziness, tingling, focal weakness, weakness and headaches.  Endo/Heme/Allergies:  Does not bruise/bleed easily.  Psychiatric/Behavioral:  Negative for depression. The patient is not nervous/anxious and does not have insomnia.     PAST MEDICAL HISTORY :  Past Medical History:  Diagnosis Date   Anemia    Arthritis    Breast cancer ([place]) 2007   right breast cancer   Breast cancer ([place]) 2016   recurrent right breast cancer, triple negative   Cancer ([place]) 2007   Right breast   Cancer of breast ([place]) 04/30/2015   Diabetes mellitus without complication ([place])    Family history of breast cancer    Family history of prostate cancer    GERD (gastroesophageal reflux disease)    Hypertension    Personal history of malignant neoplasm of breast    Pre-diabetes    Shortness of breath dyspnea     PAST SURGICAL HISTORY :   Past Surgical History:  Procedure Laterality Date   ABDOMINAL HYSTERECTOMY     APPENDECTOMY     BREAST BIOPSY Right 2007 and  2016   positive   BREAST EXCISIONAL BIOPSY Left    neg   BREAST LUMPECTOMY Right 2007   CATARACT EXTRACTION, BILATERAL Bilateral    COLONOSCOPY WITH PROPOFOL N/A 06/14/2018   Procedure: COLONOSCOPY WITH PROPOFOL;  Surgeon: Toledo, Benay Pike, MD;  Location:  [facility] ENDOSCOPY;  Service: Gastroenterology;  Laterality: N/A;   JOINT REPLACEMENT Left 2011   knee   JOINT REPLACEMENT Right 12/2011   knee   KNEE SURGERY Right 03/2012   MASTECTOMY Right 2016   MASTECTOMY MODIFIED RADICAL Right 05/22/2015   Procedure: MASTECTOMY MODIFIED RADICAL;  Surgeon: Christene Lye, MD;  Location: [facility] ORS;  Service: General;  Laterality: Right;    FAMILY HISTORY :   Family History  Problem Relation Age of Onset   Breast cancer Mother        dx late 17s   Heart attack Father    Heart attack Sister 56   Prostate cancer Brother        dx 67s   Dementia Paternal Grandfather     SOCIAL HISTORY:   Social History   Tobacco Use   Smoking status: Former    Packs/day: 0.75    Years: 2.00    Pack years: 1.50    Types: Cigarettes    Quit date: 05/15/1980    Years since quitting: 40.9   Smokeless tobacco: Never  Vaping Use   Vaping Use: Never used  Substance Use Topics   Alcohol use: No   Drug use: No    ALLERGIES:  is allergic to gabapentin and penicillin g.  MEDICATIONS:  Current Outpatient Medications  Medication Sig Dispense Refill   amLODipine (NORVASC) 10 MG tablet Take 1 tablet by mouth daily.     Cholecalciferol 50 MCG (2000 UT) CAPS Take 1 tablet by mouth daily.     cyanocobalamin (,VITAMIN B-12,) 1000 MCG/ML injection Inject 1,000 mcg into the muscle every 30 (thirty) days.     Potassium Chloride ER 20 MEQ TBCR Take 1 tablet by mouth 2 (two) times daily.     simvastatin (ZOCOR) 20 MG tablet Take 20 mg by mouth daily at 6 PM.      triamterene-hydrochlorothiazide (MAXZIDE-25) 37.5-25 MG per tablet Take 1 tablet by mouth daily.      No current facility-administered medications for this visit.    PHYSICAL EXAMINATION: ECOG PERFORMANCE STATUS: 0 - Asymptomatic  BP (!) 142/58   Pulse 60   Temp (!) 97.4 F (36.3 C) (Tympanic)   Resp 18   Ht _0  (1.575 m)   Wt 169 lb (76.7 kg)   BMI 30.91 kg/m   Filed Weights   04/23/21 1045   Weight: 169 lb (76.7 kg)    Physical Exam HENT:     Head: Normocephalic and atraumatic.     Mouth/Throat:     Pharynx: No oropharyngeal exudate.  Eyes:     Pupils: Pupils are equal, round, and reactive to light.  Cardiovascular:     Rate and Rhythm: Normal rate and regular rhythm.  Pulmonary:     Effort: No respiratory distress.     Breath sounds: No wheezing.  Abdominal:     General: Bowel sounds are normal. There is no distension.     Palpations: Abdomen is soft. There is no mass.     Tenderness: There is no abdominal tenderness. There is no guarding or rebound.  Musculoskeletal:        General: No tenderness. Normal range of motion.     Cervical  back: Normal range of motion and neck supple.  Skin:    General: Skin is warm.  Neurological:     Mental Status: She is alert and oriented to person, place, and time.  Psychiatric:        Mood and Affect: Affect normal.      LABORATORY DATA:  I have reviewed the data as listed    Component Value Date/Time   NA 137 04/23/2021 1010   NA 138 04/25/2015 1210   NA 141 03/15/2012 1210   K 3.8 04/23/2021 1010   K 3.2 (L) 03/21/2012 1256   CL 100 04/23/2021 1010   CL 102 03/15/2012 1210   CO2 28 04/23/2021 1010   CO2 33 (H) 03/15/2012 1210   GLUCOSE 102 (H) 04/23/2021 1010   GLUCOSE 113 (H) 03/15/2012 1210   BUN 16 04/23/2021 1010   BUN 15 04/25/2015 1210   BUN 10 03/15/2012 1210   CREATININE 0.74 04/23/2021 1010   CREATININE 0.75 03/15/2012 1210   CALCIUM 9.2 04/23/2021 1010   CALCIUM 9.9 03/15/2012 1210   PROT 7.5 04/23/2021 1010   PROT 7.1 04/25/2015 1210   ALBUMIN 4.4 04/23/2021 1010   ALBUMIN 4.7 04/25/2015 1210   AST 20 04/23/2021 1010   ALT 17 04/23/2021 1010   ALKPHOS 82 04/23/2021 1010   BILITOT 0.4 04/23/2021 1010   BILITOT 0.3 04/25/2015 1210   GFRNONAA >60 04/23/2021 1010   GFRNONAA >60 03/15/2012 1210   GFRAA >60 10/24/2019 1005   GFRAA >60 03/15/2012 1210    No results found for: SPEP,  UPEP  Lab Results  Component Value Date   WBC 5.0 04/23/2021   NEUTROABS 3.2 04/23/2021   HGB 12.6 04/23/2021   HCT 39.7 04/23/2021   MCV 84.1 04/23/2021   PLT 250 04/23/2021      Chemistry      Component Value Date/Time   NA 137 04/23/2021 1010   NA 138 04/25/2015 1210   NA 141 03/15/2012 1210   K 3.8 04/23/2021 1010   K 3.2 (L) 03/21/2012 1256   CL 100 04/23/2021 1010   CL 102 03/15/2012 1210   CO2 28 04/23/2021 1010   CO2 33 (H) 03/15/2012 1210   BUN 16 04/23/2021 1010   BUN 15 04/25/2015 1210   BUN 10 03/15/2012 1210   CREATININE 0.74 04/23/2021 1010   CREATININE 0.75 03/15/2012 1210      Component Value Date/Time   CALCIUM 9.2 04/23/2021 1010   CALCIUM 9.9 03/15/2012 1210   ALKPHOS 82 04/23/2021 1010   AST 20 04/23/2021 1010   ALT 17 04/23/2021 1010   BILITOT 0.4 04/23/2021 1010   BILITOT 0.3 04/25/2015 1210       RADIOGRAPHIC STUDIES: I have personally reviewed the radiological images as listed and agreed with the findings in the report. No results found.   ASSESSMENT & PLAN:  Carcinoma of overlapping sites of right breast in female, estrogen receptor negative ([place]) # Right breast ipsilateral breast recurrence triple negative [Nov 2016] s/p mastectomy no adjuvant chemotherapy.  Awaiting mammogram in dec 2022.  Stable.  No evidence of recurrence.  #Right shoulder/scapular pain- discomfort/right chest wall swelling-bone scan - July 2022-No findings suspicious for osseous metastatic disease..  S/p  referral to lymphedema clinic-symptoms improved.   # Right hip pain/arthtis- on prn NSAIDs-STABLE.   # right post mastectomy neuropathic pain-stable.  Onntinue NSAIDs PRN  #Osteopenia :[oct, 2021-] BMD T-score: of -1.9.  Continue calcium plus vitamin D   # DISPOSITION:  # follow-up  in 12  months with labs-MD-cbc/cmp/ca-27-29-Dr.B.   CC: Dr.Hedrick, James/    Orders Placed This Encounter  Procedures   CBC with Differential    Standing Status:   Future     Standing Expiration Date:   10/23/2022   Comprehensive metabolic panel    Standing Status:   Future    Standing Expiration Date:   10/23/2022   Cancer antigen 27.29    Standing Status:   Future    Standing Expiration Date:   10/23/2022   All questions were answered. The patient knows to call the clinic with any problems, questions or concerns.      Cammie Sickle, MD 04/23/2021 6:04 PM

## 2021-04-23 NOTE — Assessment & Plan Note (Addendum)
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  Awaiting mammogram in dec 2022.  Stable.  No evidence of recurrence.  #Right shoulder/scapular pain- discomfort/right chest wall swelling-bone scan - July 2022-No findings suspicious for osseous metastatic disease..  S/p  referral to lymphedema clinic-symptoms improved.  # Right hip pain/arthtis- on prn NSAIDs-STABLE.   # right post mastectomy neuropathic pain-stable.  Onntinue NSAIDs PRN  #Osteopenia :[oct, 2021-] BMD T-score: of -1.9.  Continue calcium plus vitamin D   # DISPOSITION:  # follow-up in 12  months with labs-MD-cbc/cmp/ca-27-29-Dr.B.   CC: Dr.Hedrick, James/

## 2021-04-24 LAB — CANCER ANTIGEN 27.29: CA 27.29: 10.2 U/mL (ref 0.0–38.6)

## 2021-05-15 ENCOUNTER — Other Ambulatory Visit: Payer: Self-pay

## 2021-05-15 DIAGNOSIS — Z1231 Encounter for screening mammogram for malignant neoplasm of breast: Secondary | ICD-10-CM

## 2021-06-29 ENCOUNTER — Other Ambulatory Visit: Payer: Self-pay

## 2021-06-29 ENCOUNTER — Ambulatory Visit
Admission: RE | Admit: 2021-06-29 | Discharge: 2021-06-29 | Disposition: A | Discharge status: Home / Self Care | Payer: Medicare HMO | Source: Ambulatory Visit | Attending: Surgery | Admitting: Surgery

## 2021-06-29 DIAGNOSIS — Z1231 Encounter for screening mammogram for malignant neoplasm of breast: Secondary | ICD-10-CM | POA: Insufficient documentation

## 2021-07-15 ENCOUNTER — Encounter: Payer: Self-pay | Admitting: Surgery

## 2021-07-15 ENCOUNTER — Ambulatory Visit: Payer: Medicare HMO | Admitting: Surgery

## 2021-07-15 ENCOUNTER — Other Ambulatory Visit: Payer: Self-pay

## 2021-07-15 VITALS — BP 144/73 | HR 73 | Temp 98.2°F | Ht 62.0 in | Wt 173.0 lb

## 2021-07-15 DIAGNOSIS — Z1231 Encounter for screening mammogram for malignant neoplasm of breast: Secondary | ICD-10-CM

## 2021-07-15 DIAGNOSIS — Z853 Personal history of malignant neoplasm of breast: Secondary | ICD-10-CM | POA: Diagnosis not present

## 2021-07-15 NOTE — Progress Notes (Signed)
Outpatient Surgical Follow Up  07/16/2021  Julie [name] is an 81 y.o. female.   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 Jamal Collin ( initial lumpectomy 2007 and she did have recurrence in the ipsilateral breast. She Has no issues after the mastectomy.  She denies any complaints at this time.  No fevers no chills she does self exams monthly. She Does have a history of abnormal calcifications left breast and now comes for yearly mammogram and physical exam.  I personally reviewed the mammogram showing stable calcifications   No evidence of suspicious lesions. Otherwise he is in good health    Past Medical History:  Diagnosis Date   Anemia    Arthritis    Breast cancer ([place]) 2007   right breast cancer   Breast cancer ([place]) 2016   recurrent right breast cancer, triple negative   Cancer ([place]) 2007   Right breast   Cancer of breast ([place]) 04/30/2015   Diabetes mellitus without complication ([place])    Family history of breast cancer    Family history of prostate cancer    GERD (gastroesophageal reflux disease)    Hypertension    Personal history of malignant neoplasm of breast    Pre-diabetes    Shortness of breath dyspnea     Past Surgical History:  Procedure Laterality Date   ABDOMINAL HYSTERECTOMY     APPENDECTOMY     BREAST BIOPSY Right 2007 and 2016   positive   BREAST EXCISIONAL BIOPSY Left    neg   BREAST LUMPECTOMY Right 2007   CATARACT EXTRACTION, BILATERAL Bilateral    COLONOSCOPY WITH PROPOFOL N/A 06/14/2018   Procedure: COLONOSCOPY WITH PROPOFOL;  Surgeon: Toledo, Benay Pike, MD;  Location: [facility] ENDOSCOPY;  Service: Gastroenterology;  Laterality: N/A;   JOINT REPLACEMENT Left 2011   knee   JOINT REPLACEMENT Right 12/2011   knee   KNEE SURGERY Right 03/2012   MASTECTOMY Right 2016    MASTECTOMY MODIFIED RADICAL Right 05/22/2015   Procedure: MASTECTOMY MODIFIED RADICAL;  Surgeon: Christene Lye, MD;  Location: [facility] ORS;  Service: General;  Laterality: Right;    Family History  Problem Relation Age of Onset   Breast cancer Mother        dx late 24s   Heart attack Father    Heart attack Sister 68   Prostate cancer Brother        dx 45s   Dementia Paternal Grandfather     Social History:  reports that she quit smoking about 41 years ago. Her smoking use included cigarettes. She has a 1.50 pack-year smoking history. She has never used smokeless tobacco. She reports that she does not drink alcohol and does not use drugs.  Allergies:  Allergies  Allergen Reactions   Gabapentin Other (See Comments)    dizziness   Penicillin G Rash    Medications reviewed.    ROS Full ROS performed and is otherwise negative other than what is stated in HPI   BP (!) 144/73    Pulse 73    Temp 98.2 F (36.8 C) (Oral)    Ht 5\' 2"  (1.575 m)    Wt 173 lb (78.5 kg)    SpO2 98%    BMI 31.64 kg/m   Physical Exam Vitals  and nursing note reviewed. Exam conducted with a chaperone present.  Constitutional:      Appearance: Normal appearance. She is normal weight.  Eyes:     General: No scleral icterus.       Right eye: No discharge.        Left eye: No discharge.  Cardiovascular:     Rate and Rhythm: Normal rate.     Pulses: Normal pulses.     Heart sounds: No murmur.  Pulmonary:     Effort: Pulmonary effort is normal. No respiratory distress.     Breath sounds: Normal breath sounds. No stridor.     Comments: BREAST: Right mastectomy scar, no new chest wall lesions, No masses on the left, no LAD. Left breast w/o any palpable lesion, nml nipple Abdominal:     General: Abdomen is flat. There is no distension.     Palpations: There is no mass.     Tenderness: There is no abdominal tenderness.     Hernia: No hernia is present.  Musculoskeletal:     Cervical back:  Normal range of motion and neck supple. No rigidity or tenderness.  Skin:    Capillary Refill: Capillary refill takes less than 2 seconds.  Neurological:     General: No focal deficit present.     Mental Status: She is alert and oriented to person, place, and time.  Psychiatric:        Mood and Affect: Mood normal.        Thought Content: Thought content normal.       Judgment: Judgment normal.    Assessment/Plan: 81 year old with prior history of right breast cancer status post mastectomy.  Currently there is no evidence of recurrent chest wall lesions and there is no evidence of any pathology on the left breast.  Recommend yearly mammogram with physical exam.  No need for further interventions at this time PLease note that I spent greater than 30 minutes including personally reviewing imaging studies, personally reviewing the records, counseling the patient coordinating her care, placing orders and performing appropriate documentation  Caroleen Hamman, MD [facility] General Surgeon

## 2021-07-15 NOTE — Patient Instructions (Addendum)
You have been placed on the recall list for your 1 year follow up. We will call you in December 2023 to schedule your next appointment. Please call [place] to schedule your 1 year Mammogram.  If you have any concerns or questions, please feel free to call our office.   Breast Self-Awareness Breast self-awareness is knowing how your breasts look and feel. Doing breast self-awareness is important. It allows you to catch a breast problem early while it is still small and can be treated. All women should do breast self-awareness, including women who have had breast implants. Tell your doctor if you notice a change in your breasts. What you need: A mirror. A well-lit room. How to do a breast self-exam A breast self-exam is one way to learn what is normal for your breasts and to check for changes. To do a breast self-exam: Look for changes  Take off all the clothes above your waist. Stand in front of a mirror in a room with good lighting. Put your hands on your hips. Push your hands down. Look at your breasts and nipples in the mirror to see if one breast or nipple looks different from the other. Check to see if: The shape of one breast is different. The size of one breast is different. There are wrinkles, dips, and bumps in one breast and not the other. Look at each breast for changes in the skin, such as: Redness. Scaly areas. Look for changes in your nipples, such as: Liquid around the nipples. Bleeding. Dimpling. Redness. A change in where the nipples are. Feel for changes  Lie on your back on the floor. Feel each breast. To do this, follow these steps: Pick a breast to feel. Put the arm closest to that breast above your head. Use your other arm to feel the nipple area of your breast. Feel the area with the pads of your three middle fingers by making small circles with your fingers. For the first circle, press lightly. For the second circle, press harder. For the  third circle, press even harder. Keep making circles with your fingers at the different pressures as you move down your breast. Stop when you feel your ribs. Move your fingers a little toward the center of your body. Start making circles with your fingers again, this time going up until you reach your collarbone. Keep making up-and-down circles until you reach your armpit. Remember to keep using the three pressures. Feel the other breast in the same way. Sit or stand in the tub or shower. With soapy water on your skin, feel each breast the same way you did in step 2 when you were lying on the floor. Write down what you find Writing down what you find can help you remember what to tell your doctor. Write down: What is normal for each breast. Any changes you find in each breast, including: The kind of changes you find. Whether you have pain. Size and location of any lumps. When you last had your menstrual period. General tips Check your breasts every month. If you are breastfeeding, the best time to check your breasts is after you feed your baby or after you use a breast pump. If you get menstrual periods, the best time to check your breasts is 5-7 days after your menstrual period is over. With time, you will become comfortable with the self-exam, and you will begin to know if there are changes in your breasts. Contact a doctor if you:  See a change in the shape or size of your breasts or nipples. See a change in the skin of your breast or nipples, such as red or scaly skin. Have fluid coming from your nipples that is not normal. Find a lump or thick area that was not there before. Have pain in your breasts. Have any concerns about your breast health. Summary Breast self-awareness includes looking for changes in your breasts, as well as feeling for changes within your breasts. Breast self-awareness should be done in front of a mirror in a well-lit room. You should check your breasts every  month. If you get menstrual periods, the best time to check your breasts is 5-7 days after your menstrual period is over. Let your doctor know of any changes you see in your breasts, including changes in size, changes on the skin, pain or tenderness, or fluid from your nipples that is not normal. This information is not intended to replace advice given to you by your health care provider. Make sure you discuss any questions you have with your health care provider. Document Revised: 02/14/2018 Document Reviewed: 02/14/2018 Elsevier Patient Education  [place].

## 2021-10-13 ENCOUNTER — Telehealth: Payer: Self-pay | Admitting: *Deleted

## 2021-10-13 NOTE — Telephone Encounter (Signed)
Pt contacted and agreeable to smc. Appointment scheduled.  ?

## 2021-10-13 NOTE — Telephone Encounter (Signed)
Patient called stating that she is having trouble with her shoulder on her surgical side. She states she is in terrible pain last night. She had gone through physical therapy completed last year which helped the pian subside; and the pain started back a couple of weeks ago. Heat and topical heat does not help. Ibuprofen 400 mg once a day does not seem to help either. She does feel better when she is up and moving around. The pain comes and goes , it feels like a "sore spot and its uncomfortable" Please advise. ?

## 2021-10-15 ENCOUNTER — Other Ambulatory Visit: Payer: Self-pay

## 2021-10-15 ENCOUNTER — Telehealth: Payer: Self-pay

## 2021-10-15 ENCOUNTER — Ambulatory Visit
Admission: RE | Admit: 2021-10-15 | Discharge: 2021-10-15 | Disposition: A | Discharge status: Home / Self Care | Payer: Medicare HMO | Source: Ambulatory Visit | Attending: Hospice and Palliative Medicine | Admitting: Hospice and Palliative Medicine

## 2021-10-15 ENCOUNTER — Ambulatory Visit
Admission: RE | Admit: 2021-10-15 | Discharge: 2021-10-15 | Disposition: A | Discharge status: Home / Self Care | Payer: Medicare HMO | Attending: Hospice and Palliative Medicine | Admitting: Hospice and Palliative Medicine

## 2021-10-15 ENCOUNTER — Inpatient Hospital Stay: Payer: Medicare HMO | Attending: Hospice and Palliative Medicine | Admitting: Hospice and Palliative Medicine

## 2021-10-15 VITALS — BP 137/54 | HR 63 | Temp 96.0°F | Resp 16

## 2021-10-15 DIAGNOSIS — M25511 Pain in right shoulder: Secondary | ICD-10-CM | POA: Diagnosis present

## 2021-10-15 DIAGNOSIS — G8929 Other chronic pain: Secondary | ICD-10-CM

## 2021-10-15 DIAGNOSIS — Z853 Personal history of malignant neoplasm of breast: Secondary | ICD-10-CM | POA: Insufficient documentation

## 2021-10-15 DIAGNOSIS — Z9011 Acquired absence of right breast and nipple: Secondary | ICD-10-CM | POA: Insufficient documentation

## 2021-10-15 NOTE — Progress Notes (Signed)
Pt comes to [facility] this morning with concerns of right shoulder and scapula pain. She reports that this "flared up" about 2 weeks ago. States that the pain radiates down her arm. Denies any recent injuries.  ?

## 2021-10-15 NOTE — Progress Notes (Signed)
? ?[facility]pt[facility] ?[place] [facility] ?[phone] [phone] ? ?Patient Care Team: ?[place] Pink, MD as PCP - General (Family Medicine) ?Christene Lye, MD (General Surgery) ?Cammie Sickle, [name]  ? ?Name of the patient: Julie [name]  ?[id number]  ?[birth year]  ? ?Date of visit: 10/15/21 ? ?Reason for Consult: ?SPARKLE AUBE is a 81 y.o. female with multiple medical problems including triple negative breast cancer status post right mastectomy currently on surveillance.  ? ?Patient last saw Dr. Rogue Bussing on 04/23/2021 at which time she complained of right shoulder pain.  She was sent for a bone scan which did not show any evidence of metastatic disease.  Patient had a mammogram in December 2022, which did not show any evidence of malignancy.  Recommendation was for screening mammogram in 1 year. ? ?Patient presents [facility] today for evaluation of chronic right shoulder pain/scapular pain, which is hurt a bit more intensely over the past 3 weeks.  Pain is persistent but worse with movement.  She denies any strains or traumatic injury.  The pain occasionally radiates down the right arm.  She denies swelling or bruising.  No shortness of breath or chest pain.  Patient is taking ibuprofen, which she reports is mostly relieving the pain.  Patient denies any concerns for lumps or masses of breath swelling. ? ?Denies any neurologic complaints. Denies recent fevers or illnesses. Denies any easy bleeding or bruising. Reports good appetite and denies weight loss. Denies chest pain. Denies any nausea, vomiting, constipation, or diarrhea. Denies urinary complaints. Patient offers no further specific complaints today. ? ?PAST MEDICAL HISTORY: ?Past Medical History:  ?Diagnosis Date  ? Anemia   ? Arthritis   ? Breast cancer ([place]) 2007  ? right breast cancer  ? Breast cancer ([place]) 2016  ? recurrent right breast cancer,  triple negative  ? Cancer [facility]) 2007  ? Right breast  ? Cancer of breast ([place]) 04/30/2015  ? Diabetes mellitus without complication ([place])   ? Family history of breast cancer   ? Family history of prostate cancer   ? GERD (gastroesophageal reflux disease)   ? Hypertension   ? Personal history of malignant neoplasm of breast   ? Pre-diabetes   ? Shortness of breath dyspnea   ? ? ?PAST SURGICAL HISTORY:  ?Past Surgical History:  ?Procedure Laterality Date  ? ABDOMINAL HYSTERECTOMY    ? APPENDECTOMY    ? BREAST BIOPSY Right 2007 and 2016  ? positive  ? BREAST EXCISIONAL BIOPSY Left   ? neg  ? BREAST LUMPECTOMY Right 2007  ? CATARACT EXTRACTION, BILATERAL Bilateral   ? COLONOSCOPY WITH PROPOFOL N/A 06/14/2018  ? Procedure: COLONOSCOPY WITH PROPOFOL;  Surgeon: Toledo, Benay Pike, MD;  Location: [facility] ENDOSCOPY;  Service: Gastroenterology;  Laterality: N/A;  ? JOINT REPLACEMENT Left 2011  ? knee  ? JOINT REPLACEMENT Right 12/2011  ? knee  ? KNEE SURGERY Right 03/2012  ? MASTECTOMY Right 2016  ? MASTECTOMY MODIFIED RADICAL Right 05/22/2015  ? Procedure: MASTECTOMY MODIFIED RADICAL;  Surgeon: Christene Lye, MD;  Location: [facility] ORS;  Service: General;  Laterality: Right;  ? ? ?HEMATOLOGY/ONCOLOGY HISTORY:  ?Oncology History Overview Note  ?1. Carcinoma of the breast, status post lumpectomy and sentinel lymph node biopsy. June 2007 ER/PR negative, Her-2 negative. PT2N1(mic)M0, STAGE IIb ?2. Completed 6 cycles Cytoxan/Taxotere October 2007. ?Right breast XRT. 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 ?4. Right mastectomy, May 22, 2015. Location of mass was at 9:00. All the margins were clear. ?4 lymph nodes were negative for any tumor. ?Invasive mammary carcinoma with apocrine features.  Margins are negative. Informal vascular invasion present, rpT2   pNO cMO (triple  negative). ?--------------------------------------------  ? ?DIAGNOSIS: Breast cancer ? ?STAGE:  II   ;GOALS: cure ? ?CURRENT/MOST RECENT THERAPY : survielleince ? ?  ?Carcinoma of overlapping sites of right breast in female, estrogen receptor negative ([place])  ?04/05/2016 Initial Diagnosis  ? Cancer of overlapping sites of right female breast [facility]) ?  ? ? ?ALLERGIES:  is allergic to gabapentin and penicillin g. ? ?MEDICATIONS:  ?Current Outpatient Medications  ?Medication Sig Dispense Refill  ? amLODipine (NORVASC) 10 MG tablet Take 1 tablet by mouth daily.    ? Cholecalciferol 50 MCG (2000 UT) CAPS Take 1 tablet by mouth daily.    ? colestipol (COLESTID) 1 g tablet Take 2 g by mouth 2 (two) times daily.    ? cyanocobalamin (,VITAMIN B-12,) 1000 MCG/ML injection Inject 1,000 mcg into the muscle every 30 (thirty) days.    ? Potassium Chloride ER 20 MEQ TBCR Take 1 tablet by mouth 2 (two) times daily.    ? simvastatin (ZOCOR) 20 MG tablet Take 20 mg by mouth daily at 6 PM.     ? triamterene-hydrochlorothiazide (MAXZIDE-25) 37.5-25 MG per tablet Take 1 tablet by mouth daily.     ? ?No current facility-administered medications for this visit.  ? ? ?VITAL SIGNS: ?BP (!) 137/54   Pulse 63   Temp (!) 96 ?F (35.6 ?C) (Tympanic)   Resp 16   SpO2 99%  ?There were no vitals filed for this visit.  ?Estimated body mass index is 31.64 kg/m? as calculated from the following: ?  Height as of 07/15/21: _0  (1.575 m). ?  Weight as of 07/15/21: 173 lb (78.5 kg). ? ?LABS: ?CBC: ?   ?Component Value Date/Time  ? WBC 5.0 04/23/2021 1010  ? HGB 12.6 04/23/2021 1010  ? HGB 13.2 04/25/2015 1210  ? HCT 39.7 04/23/2021 1010  ? HCT 39.6 04/25/2015 1210  ? PLT 250 04/23/2021 1010  ? PLT 212 04/25/2015 1210  ? MCV 84.1 04/23/2021 1010  ? MCV 82 04/25/2015 1210  ? MCV 85 12/14/2011 1106  ? NEUTROABS 3.2 04/23/2021 1010  ? NEUTROABS 2.6 04/25/2015 1210  ? LYMPHSABS 1.2 04/23/2021 1010  ? LYMPHSABS 1.2 04/25/2015 1210  ? MONOABS 0.5 04/23/2021 1010   ? EOSABS 0.1 04/23/2021 1010  ? EOSABS 0.2 04/25/2015 1210  ? BASOSABS 0.0 04/23/2021 1010  ? BASOSABS 0.0 04/25/2015 1210  ? ?Comprehensive Metabolic Panel: ?   ?Component Value Date/Time  ? NA 137 04/23/2021 1010  ? NA 138 04/25/2015 1210  ? NA 141 03/15/2012 1210  ? K 3.8 04/23/2021 1010  ? K 3.2 (L) 03/21/2012 1256  ? CL 100 04/23/2021 1010  ? CL 102 03/15/2012 1210  ? CO2 28 04/23/2021 1010  ? CO2 33 (H) 03/15/2012 1210  ? BUN 16 04/23/2021 1010  ? BUN 15 04/25/2015 1210  ? BUN 10 03/15/2012 1210  ? CREATININE 0.74 04/23/2021 1010  ? CREATININE 0.75 03/15/2012 1210  ? GLUCOSE 102 (H) 04/23/2021 1010  ? GLUCOSE 113 (H) 03/15/2012 1210  ? CALCIUM 9.2 04/23/2021 1010  ? CALCIUM 9.9 03/15/2012 1210  ? AST 20 04/23/2021 1010  ? ALT 17 04/23/2021 1010  ?  ALKPHOS 82 04/23/2021 1010  ? BILITOT 0.4 04/23/2021 1010  ? BILITOT 0.3 04/25/2015 1210  ? PROT 7.5 04/23/2021 1010  ? PROT 7.1 04/25/2015 1210  ? ALBUMIN 4.4 04/23/2021 1010  ? ALBUMIN 4.7 04/25/2015 1210  ? ? ?RADIOGRAPHIC STUDIES: ?No results found. ? ?PERFORMANCE STATUS (ECOG) : 1 - Symptomatic but completely ambulatory ? ?Review of Systems ?Unless otherwise noted, a complete review of systems is negative. ? ?Physical Exam ?General: NAD ?Cardiovascular: regular rate and rhythm ?Pulmonary: clear ant fields ?Abdomen: soft, nontender, + bowel sounds ?GU: no suprapubic tenderness ?Extremities: no edema, no joint deformities, no bruising but point tenderness along the right scapula/anterior right shoulder cuff ?Skin: no rashes ?Neurological: Weakness but otherwise nonfocal ? ?Assessment and Plan- Patient is a 81 y.o. female including triple negative breast cancer status post right mastectomy currently on surveillance.  ?  ?Right shoulder/scapular pain -this appears to be chronic in nature and I suspect is completely unrelated to her history of malignancy.  She does not have any evidence of recurrence of disease on recent mammography.  We will obtain x-rays of right  shoulder and cervical spine and refer to orthopedics.  Patient may continue taking ibuprofen/acetaminophen as needed for pain. ? ?RTC in 6 months to see Dr. Rogue Bussing.  We may see her sooner as needed. ? ? ?Patient expressed understanding and w

## 2021-10-15 NOTE — Telephone Encounter (Signed)
New referral for orthopedics faxed to [facility], as requested by pt. Fax transmission confirmation received.  ?

## 2022-04-23 ENCOUNTER — Inpatient Hospital Stay: Payer: Medicare HMO | Attending: Internal Medicine

## 2022-04-23 ENCOUNTER — Encounter: Payer: Self-pay | Admitting: Internal Medicine

## 2022-04-23 ENCOUNTER — Inpatient Hospital Stay (HOSPITAL_BASED_OUTPATIENT_CLINIC_OR_DEPARTMENT_OTHER): Payer: Medicare HMO | Admitting: Internal Medicine

## 2022-04-23 DIAGNOSIS — Z171 Estrogen receptor negative status [ER-]: Secondary | ICD-10-CM

## 2022-04-23 DIAGNOSIS — M25511 Pain in right shoulder: Secondary | ICD-10-CM | POA: Diagnosis not present

## 2022-04-23 DIAGNOSIS — C50811 Malignant neoplasm of overlapping sites of right female breast: Secondary | ICD-10-CM | POA: Diagnosis present

## 2022-04-23 DIAGNOSIS — Z79899 Other long term (current) drug therapy: Secondary | ICD-10-CM | POA: Diagnosis not present

## 2022-04-23 DIAGNOSIS — M8580 Other specified disorders of bone density and structure, unspecified site: Secondary | ICD-10-CM | POA: Diagnosis not present

## 2022-04-23 LAB — CBC WITH DIFFERENTIAL/PLATELET
Abs Immature Granulocytes: 0.01 10*3/uL (ref 0.00–0.07)
Basophils Absolute: 0 10*3/uL (ref 0.0–0.1)
Basophils Relative: 0 %
Eosinophils Absolute: 0.2 10*3/uL (ref 0.0–0.5)
Eosinophils Relative: 4 %
HCT: 41 % (ref 36.0–46.0)
Hemoglobin: 13.4 g/dL (ref 12.0–15.0)
Immature Granulocytes: 0 %
Lymphocytes Relative: 23 %
Lymphs Abs: 1.2 10*3/uL (ref 0.7–4.0)
MCH: 27.6 pg (ref 26.0–34.0)
MCHC: 32.7 g/dL (ref 30.0–36.0)
MCV: 84.4 fL (ref 80.0–100.0)
Monocytes Absolute: 0.5 10*3/uL (ref 0.1–1.0)
Monocytes Relative: 9 %
Neutro Abs: 3.3 10*3/uL (ref 1.7–7.7)
Neutrophils Relative %: 64 %
Platelets: 215 10*3/uL (ref 150–400)
RBC: 4.86 MIL/uL (ref 3.87–5.11)
RDW: 14.6 % (ref 11.5–15.5)
WBC: 5.3 10*3/uL (ref 4.0–10.5)
nRBC: 0 % (ref 0.0–0.2)

## 2022-04-23 LAB — COMPREHENSIVE METABOLIC PANEL
ALT: 18 U/L (ref 0–44)
AST: 21 U/L (ref 15–41)
Albumin: 4.4 g/dL (ref 3.5–5.0)
Alkaline Phosphatase: 84 U/L (ref 38–126)
Anion gap: 8 (ref 5–15)
BUN: 15 mg/dL (ref 8–23)
CO2: 28 mmol/L (ref 22–32)
Calcium: 9.4 mg/dL (ref 8.9–10.3)
Chloride: 103 mmol/L (ref 98–111)
Creatinine, Ser: 0.81 mg/dL (ref 0.44–1.00)
GFR, Estimated: >60 mL/min (ref >60)
Glucose, Bld: 111 mg/dL — ABNORMAL HIGH (ref 70–99)
Potassium: 3.6 mmol/L (ref 3.5–5.1)
Sodium: 139 mmol/L (ref 135–145)
Total Bilirubin: 0.6 mg/dL (ref 0.3–1.2)
Total Protein: 7.7 g/dL (ref 6.5–8.1)

## 2022-04-23 NOTE — Assessment & Plan Note (Addendum)
#   Right breast ipsilateral breast recurrence triple negative [Nov 2016] s/p mastectomy. DEC 2022- UNI LEFT-WNL.  Awaiting mammogram in UNI LATERAL dec 2023 [Dr.Pabon].   No evidence of recurrence. Stable.   #Right shoulder/scapular pain- discomfort/right chest wall swelling-bone scan - July 2022-No findings suspicious for osseous metastatic disease; recommend follow-up with orthopedics.  # right post mastectomy neuropathic pain-stable.  Onntinue NSAIDs PRN  #Osteopenia :[oct, 2021-] BMD T-score: of -1.9.  Continue calcium plus vitamin D; order BMD with Mammo in dec.   # FBG- 111- NOT diabetic. But discussed importance of healthy weight/and weight loss.  Strongly recommend eating more green leafy vegetables and cutting down processed food/ carbohydrates.  Instead increasing whole grains / protein in the diet.  Multiple studies have shown that optimal weight would help improve cardiovascular risk; also shown to cut on the risk of malignancies-colon cancer, breast cancer ovarian/uterine cancer in women and also prostate cancer in men.   Will call if BMD if abnormal # DISPOSITION:  # BMD in dec with mammogram # follow-up in 12  months with labs-MD-cbc/cmp/ca-27-29-Dr.B.    CC: Dr.Hedrick, James/

## 2022-04-23 NOTE — Progress Notes (Signed)
[place] OFFICE PROGRESS NOTE  Patient Care Team: [place] Pink, [name], [name], [name]   Cancer Staging  No matching staging information was found for the patient.    Oncology History Overview Note  1. Carcinoma of the breast, status post lumpectomy and sentinel lymph node biopsy. June 2007 ER/PR negative, Her-2 negative. PT2N1(mic)M0, STAGE IIb 2. Completed 6 cycles Cytoxan/Taxotere October 2007. Right breast XRT. 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 4. Right mastectomy, May 22, 2015. Location of mass was at 9:00. All the margins were clear. 4 lymph nodes were negative for any tumor. Invasive mammary carcinoma with apocrine features.  Margins are negative. Informal vascular invasion present, rpT2   pNO cMO (triple negative). --------------------------------------------   DIAGNOSIS: Breast cancer  STAGE:  II   ;GOALS: cure  CURRENT/MOST RECENT THERAPY : survielleince    Carcinoma of overlapping sites of right breast in female, estrogen receptor negative ([place])  04/05/2016 Initial Diagnosis   Cancer of overlapping sites of right female breast ([place])     INTERVAL HISTORY: Ambulating independently.  Alone.  Julie [name] 81 y.o.  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  Patient here for follow up. Patient complains of right shoulder pain with some itchiness x 2 weeks.  Denies skin rash.  Patient has been previously evaluated by orthopedics.  Noted to have arthritis.  Denies any headaches.  Denies any nausea vomiting.  She is chronically pain joint pains.   Review of Systems  Constitutional:  Negative for  chills, diaphoresis, fever, malaise/fatigue and weight loss.  HENT:  Negative for nosebleeds and sore throat.   Eyes:  Negative for double vision.  Respiratory:  Negative for cough, hemoptysis, sputum production, shortness of breath and wheezing.   Cardiovascular:  Negative for chest pain, palpitations, orthopnea and leg swelling.  Gastrointestinal:  Negative for abdominal pain, blood in stool, constipation, diarrhea, heartburn, melena, nausea and vomiting.  Genitourinary:  Negative for dysuria, frequency and urgency.  Musculoskeletal:  Positive for back pain, joint pain and myalgias.  Skin: Negative.  Negative for itching and rash.  Neurological:  Negative for dizziness, tingling, focal weakness, weakness and headaches.  Endo/Heme/Allergies:  Does not bruise/bleed easily.  Psychiatric/Behavioral:  Negative for depression. The patient is not nervous/anxious and does not have insomnia.      PAST MEDICAL HISTORY :  Past Medical History:  Diagnosis Date   Anemia    Arthritis    Breast cancer ([place]) 2007   right breast cancer   Breast cancer ([place]) 2016   recurrent right breast cancer, triple negative   Cancer ([place]) 2007   Right breast   Cancer of breast ([place]) 04/30/2015   Diabetes mellitus without complication ([place])    Family history of breast cancer    Family history of prostate cancer    GERD (gastroesophageal reflux disease)    Hypertension    Personal history of malignant neoplasm of breast    Pre-diabetes    Shortness of breath dyspnea     PAST SURGICAL HISTORY :   Past Surgical History:  Procedure Laterality Date   ABDOMINAL HYSTERECTOMY     APPENDECTOMY     BREAST BIOPSY Right 2007 and  2016   positive   BREAST EXCISIONAL BIOPSY Left    neg   BREAST LUMPECTOMY Right 2007   CATARACT EXTRACTION, BILATERAL Bilateral    COLONOSCOPY WITH PROPOFOL N/A 06/14/2018   Procedure: COLONOSCOPY WITH PROPOFOL;  Surgeon: Toledo, Benay Pike, MD;  Location: [facility] ENDOSCOPY;  Service:  Gastroenterology;  Laterality: N/A;   JOINT REPLACEMENT Left 2011   knee   JOINT REPLACEMENT Right 12/2011   knee   KNEE SURGERY Right 03/2012   MASTECTOMY Right 2016   MASTECTOMY MODIFIED RADICAL Right 05/22/2015   Procedure: MASTECTOMY MODIFIED RADICAL;  Surgeon: Christene Lye, MD;  Location: [facility] ORS;  Service: General;  Laterality: Right;    FAMILY HISTORY :   Family History  Problem Relation Age of Onset   Breast cancer Mother        dx late 51s   Heart attack Father    Heart attack Sister 34   Prostate cancer Brother        dx 4s   Dementia Paternal Grandfather     SOCIAL HISTORY:   Social History   Tobacco Use   Smoking status: Former    Packs/day: 0.75    Years: 2.00    Total pack years: 1.50    Types: Cigarettes    Quit date: 05/15/1980    Years since quitting: 41.9   Smokeless tobacco: Never  Vaping Use   Vaping Use: Never used  Substance Use Topics   Alcohol use: No   Drug use: No    ALLERGIES:  is allergic to gabapentin and penicillin g.  MEDICATIONS:  Current Outpatient Medications  Medication Sig Dispense Refill   amLODipine (NORVASC) 10 MG tablet Take 1 tablet by mouth daily.     Cholecalciferol 50 MCG (2000 UT) CAPS Take 1 tablet by mouth daily.     colestipol (COLESTID) 1 g tablet Take 2 g by mouth 2 (two) times daily.     cyanocobalamin (,VITAMIN B-12,) 1000 MCG/ML injection Inject 1,000 mcg into the muscle every 30 (thirty) days.     Potassium Chloride ER 20 MEQ TBCR Take 1 tablet by mouth 2 (two) times daily.     simvastatin (ZOCOR) 20 MG tablet Take 20 mg by mouth daily at 6 PM.      triamterene-hydrochlorothiazide (MAXZIDE-25) 37.5-25 MG per tablet Take 1 tablet by mouth daily.      No current facility-administered medications for this visit.    PHYSICAL EXAMINATION: ECOG PERFORMANCE STATUS: 0 - Asymptomatic  BP (!) 141/69 (BP Location: Left Arm, Patient Position: Sitting)   Pulse 67   Temp (!) 97.4 F (36.3 C) (Tympanic)    Ht 5' 2"  (1.575 m)   Wt 176 lb (79.8 kg)   BMI 32.19 kg/m   Filed Weights   04/23/22 1014  Weight: 176 lb (79.8 kg)    Physical Exam HENT:     Head: Normocephalic and atraumatic.     Mouth/Throat:     Pharynx: No oropharyngeal exudate.  Eyes:     Pupils: Pupils are equal, round, and reactive to light.  Cardiovascular:     Rate and Rhythm: Normal rate and regular rhythm.  Pulmonary:     Effort: No respiratory distress.     Breath sounds: No wheezing.  Abdominal:     General: Bowel sounds are normal. There is no distension.     Palpations: Abdomen is soft. There is no mass.     Tenderness: There is no abdominal tenderness. There is no guarding or  rebound.  Musculoskeletal:        General: No tenderness. Normal range of motion.     Cervical back: Normal range of motion and neck supple.  Skin:    General: Skin is warm.  Neurological:     Mental Status: She is alert and oriented to person, place, and time.  Psychiatric:        Mood and Affect: Affect normal.       LABORATORY DATA:  I have reviewed the data as listed    Component Value Date/Time   NA 139 04/23/2022 0956   NA 138 04/25/2015 1210   NA 141 03/15/2012 1210   K 3.6 04/23/2022 0956   K 3.2 (L) 03/21/2012 1256   CL 103 04/23/2022 0956   CL 102 03/15/2012 1210   CO2 28 04/23/2022 0956   CO2 33 (H) 03/15/2012 1210   GLUCOSE 111 (H) 04/23/2022 0956   GLUCOSE 113 (H) 03/15/2012 1210   BUN 15 04/23/2022 0956   BUN 15 04/25/2015 1210   BUN 10 03/15/2012 1210   CREATININE 0.81 04/23/2022 0956   CREATININE 0.75 03/15/2012 1210   CALCIUM 9.4 04/23/2022 0956   CALCIUM 9.9 03/15/2012 1210   PROT 7.7 04/23/2022 0956   PROT 7.1 04/25/2015 1210   ALBUMIN 4.4 04/23/2022 0956   ALBUMIN 4.7 04/25/2015 1210   AST 21 04/23/2022 0956   ALT 18 04/23/2022 0956   ALKPHOS 84 04/23/2022 0956   BILITOT 0.6 04/23/2022 0956   BILITOT 0.3 04/25/2015 1210   GFRNONAA >60 04/23/2022 0956   GFRNONAA >60 03/15/2012 1210    GFRAA >60 10/24/2019 1005   GFRAA >60 03/15/2012 1210    No results found for: "SPEP", "UPEP"  Lab Results  Component Value Date   WBC 5.3 04/23/2022   NEUTROABS 3.3 04/23/2022   HGB 13.4 04/23/2022   HCT 41.0 04/23/2022   MCV 84.4 04/23/2022   PLT 215 04/23/2022      Chemistry      Component Value Date/Time   NA 139 04/23/2022 0956   NA 138 04/25/2015 1210   NA 141 03/15/2012 1210   K 3.6 04/23/2022 0956   K 3.2 (L) 03/21/2012 1256   CL 103 04/23/2022 0956   CL 102 03/15/2012 1210   CO2 28 04/23/2022 0956   CO2 33 (H) 03/15/2012 1210   BUN 15 04/23/2022 0956   BUN 15 04/25/2015 1210   BUN 10 03/15/2012 1210   CREATININE 0.81 04/23/2022 0956   CREATININE 0.75 03/15/2012 1210      Component Value Date/Time   CALCIUM 9.4 04/23/2022 0956   CALCIUM 9.9 03/15/2012 1210   ALKPHOS 84 04/23/2022 0956   AST 21 04/23/2022 0956   ALT 18 04/23/2022 0956   BILITOT 0.6 04/23/2022 0956   BILITOT 0.3 04/25/2015 1210       RADIOGRAPHIC STUDIES: I have personally reviewed the radiological images as listed and agreed with the findings in the report. No results found.   ASSESSMENT & PLAN:  Carcinoma of overlapping sites of right breast in female, estrogen receptor negative ([place]) # Right breast ipsilateral breast recurrence triple negative [Nov 2016] s/p mastectomy. DEC 2022- UNI LEFT-WNL.  Awaiting mammogram in UNI LATERAL dec 2023 [Dr.Pabon].   No evidence of recurrence. Stable.   #Right shoulder/scapular pain- discomfort/right chest wall swelling-bone scan - July 2022-No findings suspicious for osseous metastatic disease; recommend follow-up with orthopedics.  # right post mastectomy neuropathic pain-stable.  Onntinue NSAIDs PRN  #Osteopenia :[oct, 2021-] BMD T-score: of -  1.9.  Continue calcium plus vitamin D; order BMD with Mammo in dec.   # FBG- 111- NOT diabetic. But discussed importance of healthy weight/and weight loss.  Strongly recommend eating more green leafy  vegetables and cutting down processed food/ carbohydrates.  Instead increasing whole grains / protein in the diet.  Multiple studies have shown that optimal weight would help improve cardiovascular risk; also shown to cut on the risk of malignancies-colon cancer, breast cancer ovarian/uterine cancer in women and also prostate cancer in men.   Will call if BMD if abnormal # DISPOSITION:  # BMD in dec with mammogram # follow-up in 12  months with labs-MD-cbc/cmp/ca-27-29-Dr.B.    CC: Dr.Hedrick, James/    Orders Placed This Encounter  Procedures   DG Bone Density    Standing Status:   Future    Standing Expiration Date:   04/23/2023    Order Specific Question:   Reason for Exam (SYMPTOM  OR DIAGNOSIS REQUIRED)    Answer:   postmenopausal    Order Specific Question:   Preferred imaging location?    Answer:   [facility]   MM 3D SCREEN BREAST UNI LEFT    Standing Status:   Future    Standing Expiration Date:   04/24/2023    Order Specific Question:   Reason for Exam (SYMPTOM  OR DIAGNOSIS REQUIRED)    Answer:   history of breast cancer    Order Specific Question:   [facility]?    [facility]   CBC with Differential/Platelet    Standing Status:   Future    Standing Expiration Date:   04/23/2023   Comprehensive metabolic panel    Standing Status:   Future    Standing Expiration Date:   04/23/2023   Cancer antigen 27.29    Standing Status:   Future    Standing Expiration Date:   04/23/2023   All questions were answered. The patient knows to call the clinic with any problems, questions or concerns.      Cammie Sickle, MD 04/23/2022 11:01 AM

## 2022-04-23 NOTE — Progress Notes (Signed)
Patient here for follow up. Patient complains of right shoulder pain with some itchiness x 2 weeks.

## 2022-04-24 LAB — CANCER ANTIGEN 27.29: CA 27.29: 9.1 U/mL (ref 0.0–38.6)

## 2022-06-30 ENCOUNTER — Ambulatory Visit
Admission: RE | Admit: 2022-06-30 | Discharge: 2022-06-30 | Disposition: A | Discharge status: Home / Self Care | Payer: Medicare HMO | Source: Ambulatory Visit | Attending: Internal Medicine | Admitting: Internal Medicine

## 2022-06-30 DIAGNOSIS — M810 Age-related osteoporosis without current pathological fracture: Secondary | ICD-10-CM | POA: Diagnosis not present

## 2022-06-30 DIAGNOSIS — Z87891 Personal history of nicotine dependence: Secondary | ICD-10-CM | POA: Diagnosis not present

## 2022-06-30 DIAGNOSIS — Z13820 Encounter for screening for osteoporosis: Secondary | ICD-10-CM | POA: Diagnosis not present

## 2022-06-30 DIAGNOSIS — Z923 Personal history of irradiation: Secondary | ICD-10-CM | POA: Insufficient documentation

## 2022-06-30 DIAGNOSIS — Z780 Asymptomatic menopausal state: Secondary | ICD-10-CM | POA: Insufficient documentation

## 2022-06-30 DIAGNOSIS — Z853 Personal history of malignant neoplasm of breast: Secondary | ICD-10-CM | POA: Insufficient documentation

## 2022-06-30 DIAGNOSIS — Z9221 Personal history of antineoplastic chemotherapy: Secondary | ICD-10-CM | POA: Insufficient documentation

## 2022-06-30 DIAGNOSIS — Z1231 Encounter for screening mammogram for malignant neoplasm of breast: Secondary | ICD-10-CM | POA: Diagnosis not present

## 2022-06-30 DIAGNOSIS — C50811 Malignant neoplasm of overlapping sites of right female breast: Secondary | ICD-10-CM | POA: Insufficient documentation

## 2022-06-30 DIAGNOSIS — Z171 Estrogen receptor negative status [ER-]: Secondary | ICD-10-CM

## 2022-07-01 ENCOUNTER — Other Ambulatory Visit: Payer: Self-pay | Admitting: Internal Medicine

## 2022-07-01 DIAGNOSIS — N63 Unspecified lump in unspecified breast: Secondary | ICD-10-CM

## 2022-07-01 DIAGNOSIS — R928 Other abnormal and inconclusive findings on diagnostic imaging of breast: Secondary | ICD-10-CM

## 2022-07-14 ENCOUNTER — Ambulatory Visit
Admission: RE | Admit: 2022-07-14 | Discharge: 2022-07-14 | Disposition: A | Discharge status: Home / Self Care | Payer: Medicare HMO | Source: Ambulatory Visit | Attending: Internal Medicine | Admitting: Internal Medicine

## 2022-07-14 DIAGNOSIS — R928 Other abnormal and inconclusive findings on diagnostic imaging of breast: Secondary | ICD-10-CM | POA: Insufficient documentation

## 2022-07-14 DIAGNOSIS — N63 Unspecified lump in unspecified breast: Secondary | ICD-10-CM

## 2022-07-19 ENCOUNTER — Encounter: Payer: Self-pay | Admitting: Surgery

## 2022-07-19 ENCOUNTER — Ambulatory Visit: Payer: Medicare HMO | Admitting: Surgery

## 2022-07-19 VITALS — BP 130/73 | HR 67 | Temp 98.0°F | Ht 62.0 in | Wt 173.0 lb

## 2022-07-19 DIAGNOSIS — Z853 Personal history of malignant neoplasm of breast: Secondary | ICD-10-CM | POA: Diagnosis not present

## 2022-07-19 DIAGNOSIS — Z1231 Encounter for screening mammogram for malignant neoplasm of breast: Secondary | ICD-10-CM

## 2022-07-19 NOTE — Patient Instructions (Signed)
If you have any concerns or questions, please feel free to call our office. Follow up in 1 year with Mammogram done prior.   Breast Self-Awareness Breast self-awareness means being familiar with how your breasts look and feel. It involves checking your breasts regularly and telling your health care provider about any changes. Practicing breast self-awareness helps to maintain breast health. Sometimes, changes are not harmful (are benign). Other times, a change in your breasts can be a sign of a serious medical problem. Being familiar with the look and feel of your breasts can help you catch a breast problem while it is still small and can be treated. You should do breast self-exams even if you have breast implants. What you need: A mirror. A well-lit room. A pillow or other soft object. How to do a breast self-exam A breast self-exam is one way to learn what is normal for your breasts and whether your breasts are changing. To do a breast self-exam: Look for changes  Remove all the clothing above your waist. Stand in front of a mirror in a room with good lighting. Put your hands down at your sides. Compare your breasts in the mirror. Look for differences between them (asymmetry), such as: Differences in shape. Differences in size. Puckers, dips, and bumps in one breast and not the other. Look at each breast for changes in the skin, such as: Redness. Scaly areas. Skin thickening. Dimpling. Open sores (ulcers). Look for changes in your nipples, such as: Discharge. Bleeding. Dimpling. Redness. A nipple that looks pushed in (retracted), or that has changed position. Feel for changes Carefully feel your breasts for lumps and changes. It is best to do this self-exam while lying down. Follow these steps to feel each breast: Place a pillow under the shoulder of one side of your body. Place the arm of that side of your body behind your head. Feel the breast of that side of your body using  the hand of the opposite arm. To do this: Start in the nipple area and use the pads of your three middle fingers to make -inch (2 cm) overlapping circles. Use light, medium, and then firm pressure as you feel your breast, gently covering the entire breast area and armpit. Continue the overlapping circles, moving downward over the breast until you feel your ribs below your breast. Then, make circles with your fingers going upward until you reach your collarbone. Next, make circles by moving outward across your breast and into your armpit area. Squeeze the nipple. Check for discharge and lumps. Repeat steps 1-7 to check your other breast. Sit or stand in the tub or shower. With soapy water on your skin, feel each breast the same way you did when you were lying down. Write down what you find Writing down what you find can help you remember what to discuss with your health care provider. Write down: What is normal for each breast. Any changes that you find in each breast. These include: The kind of changes you find. Any pain or tenderness. Size and location of any lumps. Where you are in your menstrual cycle, if you are still getting your menstrual period (menstruating). General tips If you are breastfeeding, the best time to examine your breasts is after a feeding or after using a breast pump. If you menstruate, the best time to examine your breasts is 5-7 days after your menstrual period. Breasts are generally lumpier during menstrual periods, and it may be more difficult to notice changes.  With time and practice, you will become more familiar with the differences in your breasts and more comfortable with the exam. Contact a health care provider if: You see a change in the shape or size of your breasts or nipples. You see a change in the skin of your breast or nipples, such as a reddened or scaly area. You have unusual discharge from your nipples. You find a new lump or thick area. You have  breast pain. You have any concerns about your breast health. Summary Breast self-awareness includes looking for physical changes in your breasts and feeling for any changes within your breasts. Breast self-awareness should be done in front of a mirror in a well-lit room. If you menstruate, the best time to examine your breasts is 5-7 days after your menstrual period. Tell your health care provider about any changes you notice in your breasts. Changes include changes in size, changes on the skin, pain or tenderness, or unusual fluid from your nipples. This information is not intended to replace advice given to you by your health care provider. Make sure you discuss any questions you have with your health care provider. Document Revised: 12/03/2021 Document Reviewed: 04/30/2021 Elsevier Patient Education  [place].

## 2022-07-22 ENCOUNTER — Encounter: Payer: Self-pay | Admitting: Surgery

## 2022-07-22 IMAGING — MG DIGITAL SCREENING UNILAT LEFT W/ TOMO W/ CAD
6 series · 6 of 18 positions shown · non-contrast
Comparison: Previous exam(s).

CLINICAL DATA: Screening.

EXAM:
DIGITAL SCREENING UNILATERAL LEFT MAMMOGRAM WITH CAD AND
TOMOSYNTHESIS
TECHNIQUE: Left screening digital craniocaudal and mediolateral oblique
mammograms were obtained. Left screening digital breast
tomosynthesis was performed. The images were evaluated with
computer-aided detection.

[L MLO synth-2D]
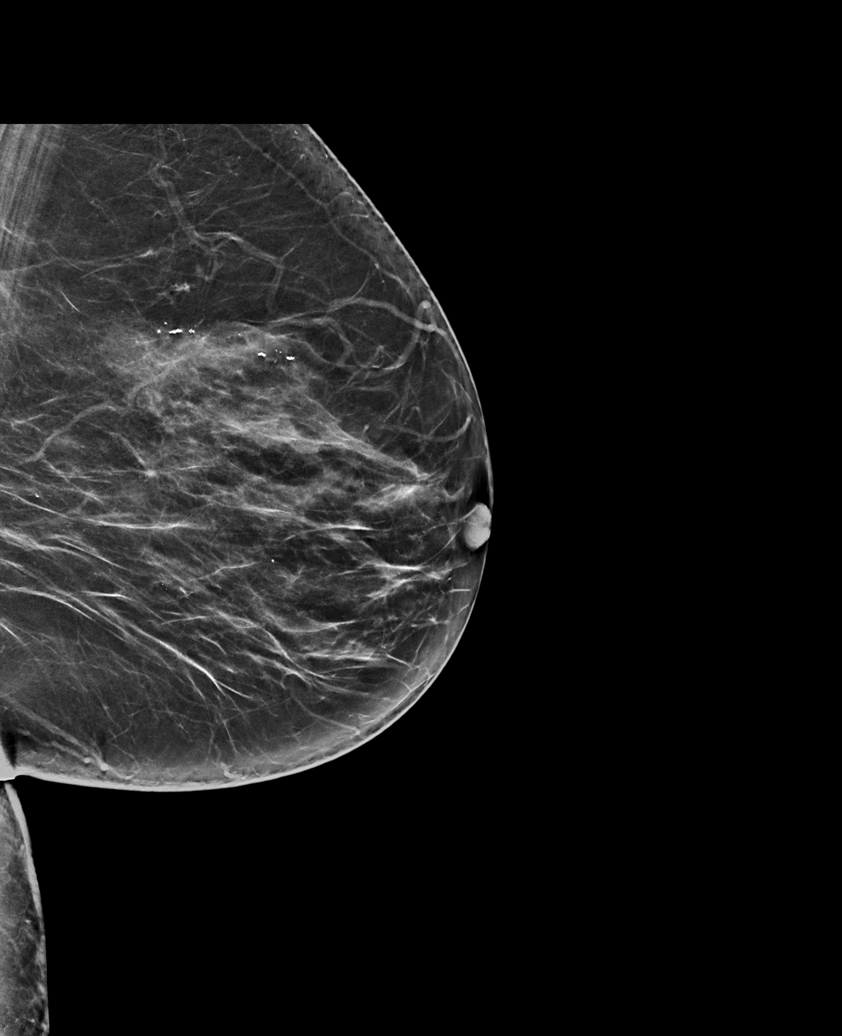

[L CC synth-2D]
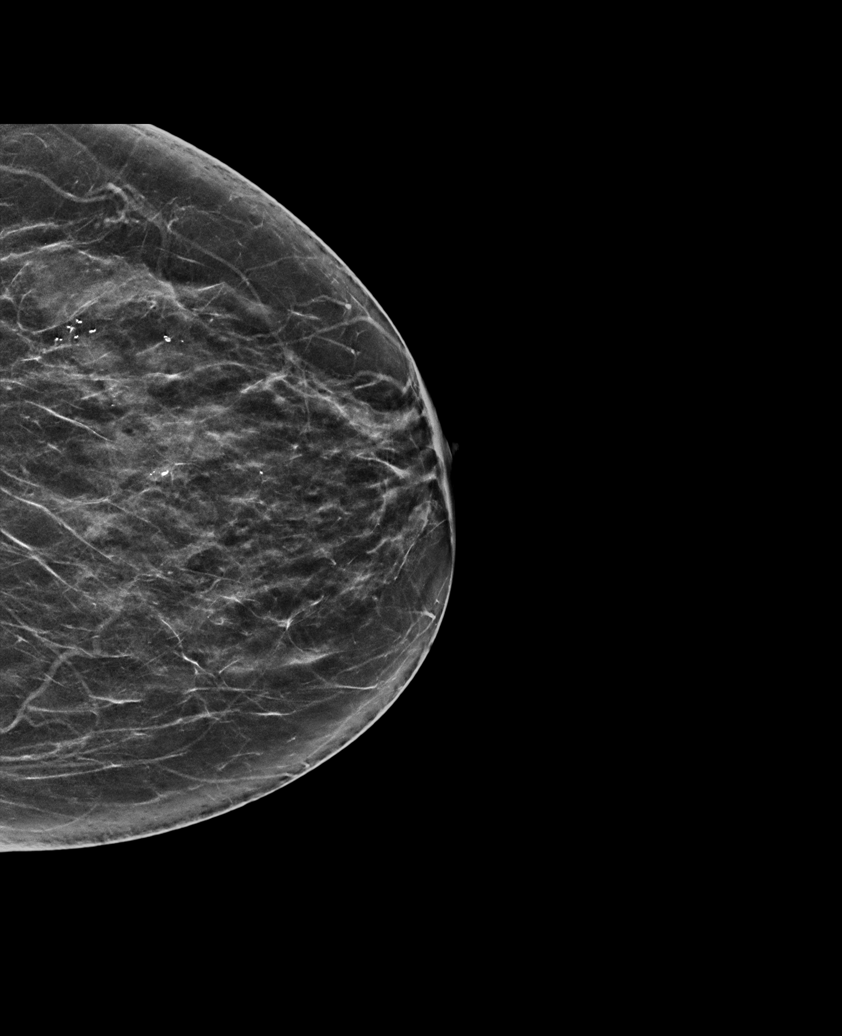

[L XCCL synth-2D]
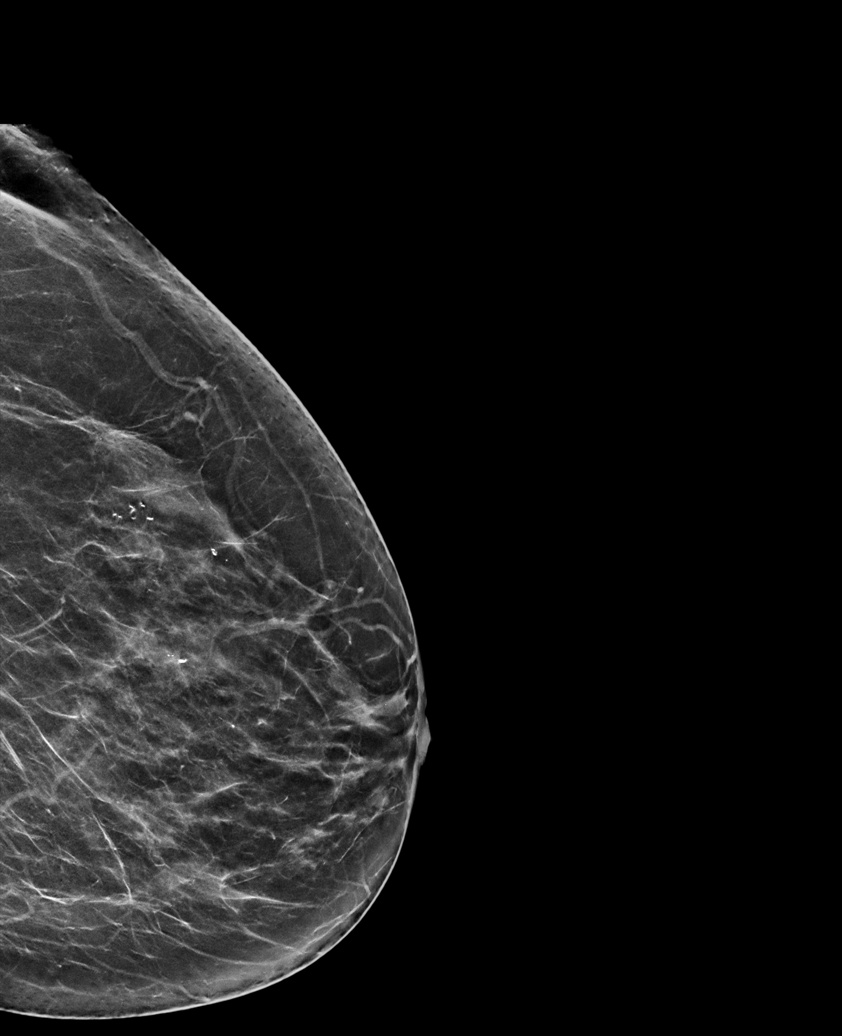

[L MLO tomo · tomo slice 37/72.0]
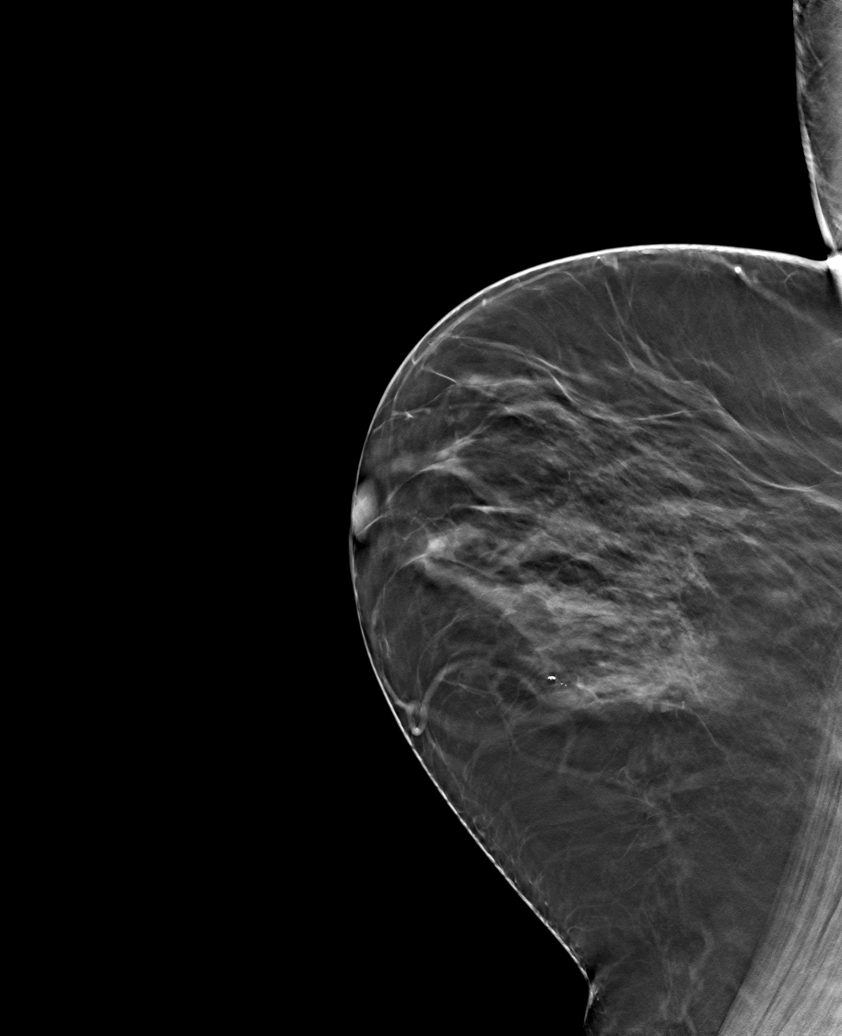

[L CC tomo · tomo slice 33/65.0]
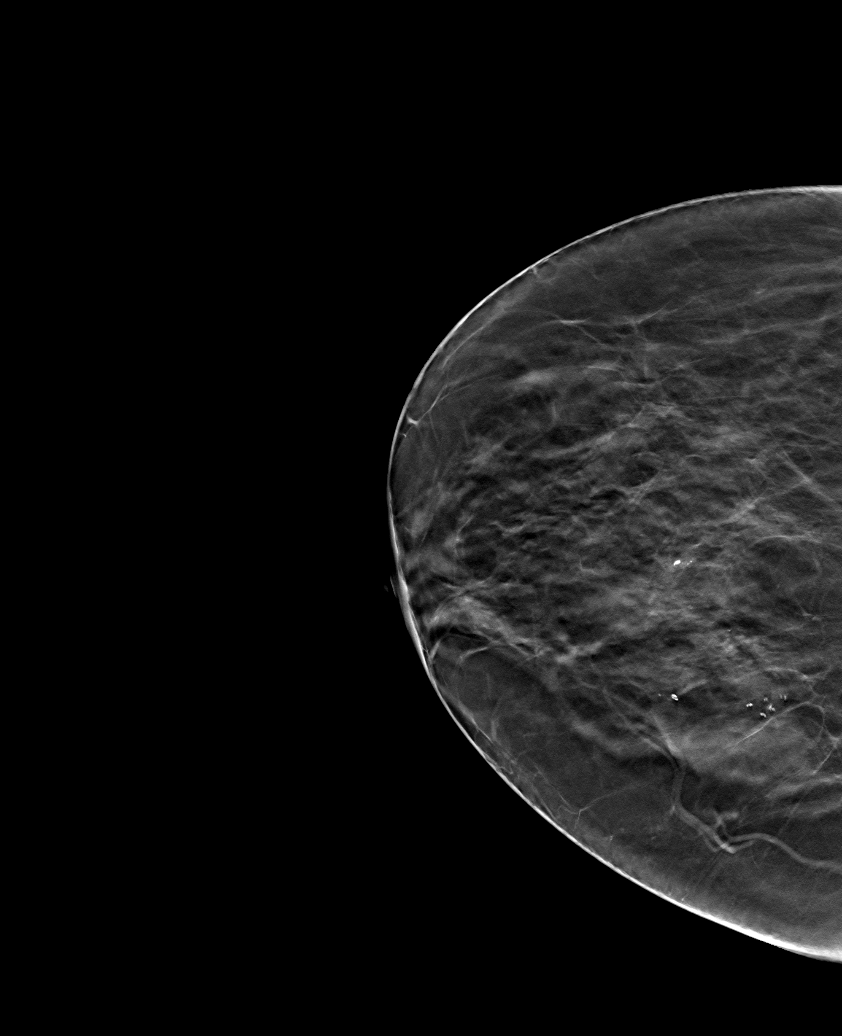

[L XCCL tomo · tomo slice 35/68.0]
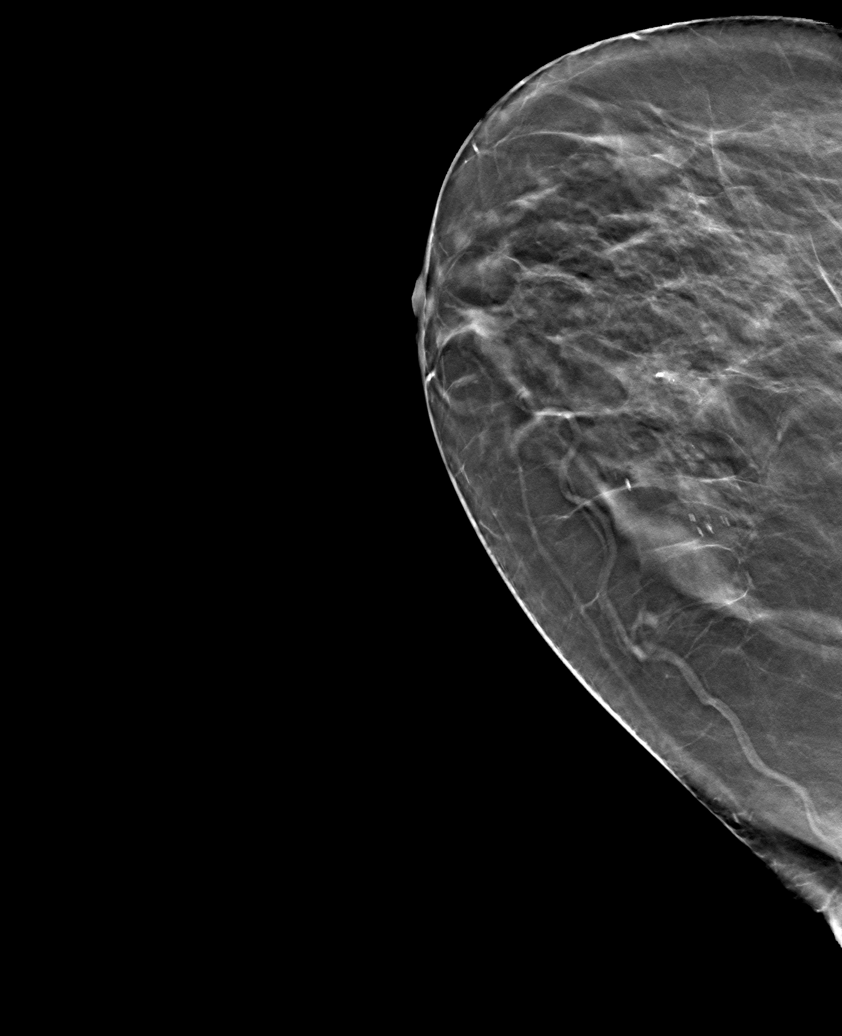

[6 of 18 positions shown; findings below may reference images not displayed]

ACR Breast Density Category c: The breast tissue is heterogeneously
dense, which may obscure small masses.
FINDINGS: The patient has had a right mastectomy. There are no findings
suspicious for malignancy.
IMPRESSION: No mammographic evidence of malignancy. A result letter of this
screening mammogram will be mailed directly to the patient.

RECOMMENDATION:
Screening mammogram in one year.  (Code:HB-T-2WB)

BI-RADS CATEGORY  1: Negative.

## 2022-07-22 NOTE — Progress Notes (Signed)
Outpatient Surgical Follow Up  07/22/2022  Julie [name] is an 82 y.o. female.   Chief Complaint  Patient presents with   Follow-up    Uni l mammo    HPI:   82 yo with a history of triple negative recurrent right breast cancer status post Modified radical mastectomy 2016 by Dr. Jamal Collin ( initial lumpectomy 2007 and she did have recurrence in the ipsilateral breast requiring mastectomy, She Has no issues after the mastectomy.  She denies any complaints at this time.  No fevers no chills she does self exams monthly. She Does have a history of abnormal calcifications left breast and now comes for yearly mammogram and physical exam.    I personally reviewed the mammogram showing stable calcifications   there was evidence of a simple cyst on the left breast this was confirmed by ultrasound.  No need for biopsy. Otherwise he is in good health   Past Medical History:  Diagnosis Date   Anemia    Arthritis    Breast cancer ([place]) 2007   right breast cancer   Breast cancer ([place]) 2016   recurrent right breast cancer, triple negative   Cancer ([place]) 2007   Right breast   Cancer of breast ([place]) 04/30/2015   Diabetes mellitus without complication ([place])    Family history of breast cancer    Family history of prostate cancer    GERD (gastroesophageal reflux disease)    Hypertension    Personal history of malignant neoplasm of breast    Pre-diabetes    Shortness of breath dyspnea     Past Surgical History:  Procedure Laterality Date   ABDOMINAL HYSTERECTOMY     APPENDECTOMY     BREAST BIOPSY Right 2007 and 2016   positive   BREAST EXCISIONAL BIOPSY Left    neg   BREAST LUMPECTOMY Right 2007   CATARACT EXTRACTION, BILATERAL Bilateral    COLONOSCOPY WITH PROPOFOL N/A 06/14/2018   Procedure: COLONOSCOPY WITH PROPOFOL;  Surgeon: Toledo, Benay Pike, MD;  Location: [facility];  Service: Gastroenterology;  Laterality: N/A;   JOINT REPLACEMENT Left 2011   knee   JOINT REPLACEMENT Right  12/2011   knee   KNEE SURGERY Right 03/2012   MASTECTOMY Right 2016   MASTECTOMY MODIFIED RADICAL Right 05/22/2015   Procedure: MASTECTOMY MODIFIED RADICAL;  Surgeon: Christene Lye, MD;  Location: [facility] ORS;  Service: General;  Laterality: Right;    Family History  Problem Relation Age of Onset   Breast cancer Mother        dx late 16s   Heart attack Father    Heart attack Sister 25   Prostate cancer Brother        dx 5s   Dementia Paternal Grandfather     Social History:  reports that she quit smoking about 42 years ago. Her smoking use included cigarettes. She has a 1.50 pack-year smoking history. She has never used smokeless tobacco. She reports that she does not drink alcohol and does not use drugs.  Allergies:  Allergies  Allergen Reactions   Gabapentin Other (See Comments)    dizziness   Penicillin G Rash    Medications reviewed.    ROS Full ROS performed and is otherwise negative other than what is stated in HPI   BP 130/73   Pulse 67   Temp 98 F (36.7 C) (Oral)   Ht '5\' 2"'$  (1.575 m)   Wt 173 lb (78.5 kg)   SpO2 99%   BMI  31.64 kg/m   Physical Exam Vitals and nursing note reviewed. Exam conducted with a chaperone present.  Constitutional:      Appearance: Normal appearance. She is normal weight.  Eyes:     General: No scleral icterus.       Right eye: No discharge.        Left eye: No discharge.  Cardiovascular:     Rate and Rhythm: Normal rate.     Pulses: Normal pulses.     Heart sounds: No murmur.  Pulmonary:     Effort: Pulmonary effort is normal. No respiratory distress.     Breath sounds: Normal breath sounds. No stridor.     Comments: BREAST: Right mastectomy scar, no new chest wall lesions, No masses on the left, no LAD. Left breast w/o any palpable lesion, nml nipple Abdominal:     General: Abdomen is flat. There is no distension.     Palpations: There is no mass.     Tenderness: There is no abdominal tenderness.     Hernia: No  hernia is present.  Musculoskeletal:     Cervical back: Normal range of motion and neck supple. No rigidity or tenderness.  Skin:    Capillary Refill: Capillary refill takes less than 2 seconds.  Neurological:     General: No focal deficit present.     Mental Status: She is alert and oriented to person, place, and time.  Psychiatric:        Mood and Affect: Mood normal.        Thought Content: Thought content normal.       Judgment: Judgment normal.    Assessment/Plan:  82 year old with prior history of right breast cancer status post mastectomy.  Currently there is no evidence of recurrent chest wall lesions and there is no evidence of any pathology on the left breast.  Ultrasound confirmed a benign simple cyst without need for any further biopsies or procedures at this time.   Recommend yearly mammogram with physical exam.  No need for further interventions at this time PLease note that I spent greater than 30 minutes including personally reviewing imaging studies, personally reviewing the records, counseling the patient coordinating her care, placing orders and performing appropriate documentation   Caroleen Hamman, MD [place], MD [facility] General Surgeon

## 2023-04-25 ENCOUNTER — Inpatient Hospital Stay (HOSPITAL_BASED_OUTPATIENT_CLINIC_OR_DEPARTMENT_OTHER): Payer: Medicare HMO | Admitting: Internal Medicine

## 2023-04-25 ENCOUNTER — Inpatient Hospital Stay: Payer: Medicare HMO | Attending: Internal Medicine

## 2023-04-25 ENCOUNTER — Ambulatory Visit: Payer: Medicare HMO | Admitting: Oncology

## 2023-04-25 ENCOUNTER — Other Ambulatory Visit: Payer: Medicare HMO

## 2023-04-25 ENCOUNTER — Encounter: Payer: Self-pay | Admitting: Internal Medicine

## 2023-04-25 DIAGNOSIS — Z87891 Personal history of nicotine dependence: Secondary | ICD-10-CM | POA: Insufficient documentation

## 2023-04-25 DIAGNOSIS — Z79899 Other long term (current) drug therapy: Secondary | ICD-10-CM | POA: Diagnosis not present

## 2023-04-25 DIAGNOSIS — M8580 Other specified disorders of bone density and structure, unspecified site: Secondary | ICD-10-CM | POA: Diagnosis not present

## 2023-04-25 DIAGNOSIS — C50811 Malignant neoplasm of overlapping sites of right female breast: Secondary | ICD-10-CM | POA: Diagnosis not present

## 2023-04-25 DIAGNOSIS — Z171 Estrogen receptor negative status [ER-]: Secondary | ICD-10-CM | POA: Diagnosis not present

## 2023-04-25 DIAGNOSIS — Z853 Personal history of malignant neoplasm of breast: Secondary | ICD-10-CM | POA: Insufficient documentation

## 2023-04-25 LAB — CBC WITH DIFFERENTIAL/PLATELET
Abs Immature Granulocytes: 0.01 10*3/uL (ref 0.00–0.07)
Basophils Absolute: 0 10*3/uL (ref 0.0–0.1)
Basophils Relative: 1 %
Eosinophils Absolute: 0.1 10*3/uL (ref 0.0–0.5)
Eosinophils Relative: 3 %
HCT: 40 % (ref 36.0–46.0)
Hemoglobin: 13 g/dL (ref 12.0–15.0)
Immature Granulocytes: 0 %
Lymphocytes Relative: 28 %
Lymphs Abs: 1.2 10*3/uL (ref 0.7–4.0)
MCH: 27.1 pg (ref 26.0–34.0)
MCHC: 32.5 g/dL (ref 30.0–36.0)
MCV: 83.3 fL (ref 80.0–100.0)
Monocytes Absolute: 0.4 10*3/uL (ref 0.1–1.0)
Monocytes Relative: 10 %
Neutro Abs: 2.6 10*3/uL (ref 1.7–7.7)
Neutrophils Relative %: 58 %
Platelets: 214 10*3/uL (ref 150–400)
RBC: 4.8 MIL/uL (ref 3.87–5.11)
RDW: 14.4 % (ref 11.5–15.5)
WBC: 4.4 10*3/uL (ref 4.0–10.5)
nRBC: 0 % (ref 0.0–0.2)

## 2023-04-25 LAB — COMPREHENSIVE METABOLIC PANEL
ALT: 18 U/L (ref 0–44)
AST: 21 U/L (ref 15–41)
Albumin: 4.2 g/dL (ref 3.5–5.0)
Alkaline Phosphatase: 69 U/L (ref 38–126)
Anion gap: 6 (ref 5–15)
BUN: 16 mg/dL (ref 8–23)
CO2: 28 mmol/L (ref 22–32)
Calcium: 9.5 mg/dL (ref 8.9–10.3)
Chloride: 103 mmol/L (ref 98–111)
Creatinine, Ser: 0.79 mg/dL (ref 0.44–1.00)
GFR, Estimated: >60 mL/min (ref >60)
Glucose, Bld: 108 mg/dL — ABNORMAL HIGH (ref 70–99)
Potassium: 3.4 mmol/L — ABNORMAL LOW (ref 3.5–5.1)
Sodium: 137 mmol/L (ref 135–145)
Total Bilirubin: 0.4 mg/dL (ref 0.3–1.2)
Total Protein: 7.3 g/dL (ref 6.5–8.1)

## 2023-04-25 NOTE — Progress Notes (Signed)
Julie [name]  Patient Care Team: Julie Mina, MD as PCP - General (Family Medicine) Julie Brightly, MD (General Surgery) Julie Coder, [name]   Cancer Staging  No matching staging information was found for the patient.     Oncology History Overview [name]  1. Carcinoma of the breast, status post lumpectomy and sentinel lymph node biopsy. June 2007 ER/PR negative, Her-2 negative. PT2N1(mic)M0, STAGE IIb 2. Completed 6 cycles Cytoxan/Taxotere October 2007. Right breast XRT. 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 4. Right mastectomy, May 22, 2015. Location of mass was at 9:00. All the margins were clear. 4 lymph nodes were negative for any tumor. Invasive mammary carcinoma with apocrine features.  Margins are negative. Informal vascular invasion present, rpT2   pNO cMO (triple negative). --------------------------------------------   DIAGNOSIS: Breast cancer  STAGE:  II   ;GOALS: cure  CURRENT/MOST RECENT THERAPY : survielleince    Carcinoma of overlapping sites of right breast in female, estrogen receptor negative (HCC)  04/05/2016 Initial Diagnosis   Cancer of overlapping sites of right female breast (HCC)     INTERVAL HISTORY: Ambulating independently.  Alone.  Julie [name] 82 y.o.  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  Patient here for follow up. Patient complains of right chest wall fullness.   Denies skin rash.   Denies any headaches.  Denies any nausea vomiting.  She is chronically pain joint pains.   Review of Systems  Constitutional:  Negative for chills, diaphoresis, fever, malaise/fatigue and weight loss.  HENT:  Negative for nosebleeds and sore  throat.   Eyes:  Negative for double vision.  Respiratory:  Negative for cough, hemoptysis, sputum production, shortness of breath and wheezing.   Cardiovascular:  Negative for chest pain, palpitations, orthopnea and leg swelling.  Gastrointestinal:  Negative for abdominal pain, blood in stool, constipation, diarrhea, heartburn, melena, nausea and vomiting.  Genitourinary:  Negative for dysuria, frequency and urgency.  Musculoskeletal:  Positive for back pain, joint pain and myalgias.  Skin: Negative.  Negative for itching and rash.  Neurological:  Negative for dizziness, tingling, focal weakness, weakness and headaches.  Endo/Heme/Allergies:  Does not bruise/bleed easily.  Psychiatric/Behavioral:  Negative for depression. The patient is not nervous/anxious and does not have insomnia.      PAST MEDICAL HISTORY :  Past Medical History:  Diagnosis Date   Anemia    Arthritis    Breast cancer (HCC) 2007   right breast cancer   Breast cancer (HCC) 2016   recurrent right breast cancer, triple negative   Cancer (HCC) 2007   Right breast   Cancer of breast (HCC) 04/30/2015   Diabetes mellitus without complication (HCC)    Family history of breast cancer    Family history of prostate cancer    GERD (gastroesophageal reflux disease)    Hypertension    Personal history of malignant neoplasm of breast    Pre-diabetes    Shortness of breath dyspnea     PAST SURGICAL HISTORY :   Past Surgical History:  Procedure Laterality Date   ABDOMINAL HYSTERECTOMY     APPENDECTOMY     BREAST BIOPSY Right 2007 and 2016   positive   BREAST EXCISIONAL BIOPSY Left    neg  BREAST LUMPECTOMY Right 2007   CATARACT EXTRACTION, BILATERAL Bilateral    COLONOSCOPY WITH PROPOFOL N/A 06/14/2018   Procedure: COLONOSCOPY WITH PROPOFOL;  Surgeon: Toledo, Boykin Nearing, MD;  Location: [facility];  Service: Gastroenterology;  Laterality: N/A;   JOINT REPLACEMENT Left 2011   knee   JOINT REPLACEMENT Right  12/2011   knee   KNEE SURGERY Right 03/2012   MASTECTOMY Right 2016   MASTECTOMY MODIFIED RADICAL Right 05/22/2015   Procedure: MASTECTOMY MODIFIED RADICAL;  Surgeon: Julie Brightly, MD;  Location: [facility];  Service: General;  Laterality: Right;    FAMILY HISTORY :   Family History  Problem Relation Age of Onset   Breast cancer Mother        dx late 64s   Heart attack Father    Heart attack Sister 27   Prostate cancer Brother        dx 84s   Dementia Paternal Grandfather     SOCIAL HISTORY:   Social History   Tobacco Use   Smoking status: Former    Current packs/day: 0.00    Average packs/day: 0.8 packs/day for 2.0 years (1.5 ttl pk-yrs)    Types: Cigarettes    Start date: 05/15/1978    Quit date: 05/15/1980    Years since quitting: 42.9   Smokeless tobacco: Never  Vaping Use   Vaping status: Never Used  Substance Use Topics   Alcohol use: No   Drug use: No    ALLERGIES:  is allergic to gabapentin and penicillin g.  MEDICATIONS:  Current Outpatient Medications  Medication Sig Dispense Refill   amLODipine (NORVASC) 10 MG tablet Take 1 tablet by mouth daily.     Cholecalciferol 50 MCG (2000 UT) CAPS Take 1 tablet by mouth daily.     colestipol (COLESTID) 1 g tablet Take 2 g by mouth 2 (two) times daily.     cyanocobalamin (,VITAMIN B-12,) 1000 MCG/ML injection Inject 1,000 mcg into the muscle every 30 (thirty) days.     Potassium Chloride ER 20 MEQ TBCR Take 1 tablet by mouth 2 (two) times daily.     simvastatin (ZOCOR) 20 MG tablet Take 20 mg by mouth daily at 6 PM.      triamterene-hydrochlorothiazide (MAXZIDE-25) 37.5-25 MG per tablet Take 1 tablet by mouth daily.      No current facility-administered medications for this visit.    PHYSICAL EXAMINATION: ECOG PERFORMANCE STATUS: 0 - Asymptomatic  BP 122/60 (BP Location: Left Arm, Patient Position: Sitting, Cuff Size: Normal)   Pulse 84   Temp 97.8 F (36.6 C) (Tympanic)   Ht 5\' 2"  (1.575 m)   Wt 171  lb 3.2 oz (77.7 kg)   SpO2 97%   BMI 31.31 kg/m   Filed Weights   04/25/23 1002  Weight: 171 lb 3.2 oz (77.7 kg)    Physical Exam HENT:     Head: Normocephalic and atraumatic.     Mouth/Throat:     Pharynx: No oropharyngeal exudate.  Eyes:     Pupils: Pupils are equal, round, and reactive to light.  Cardiovascular:     Rate and Rhythm: Normal rate and regular rhythm.  Pulmonary:     Effort: No respiratory distress.     Breath sounds: No wheezing.  Abdominal:     General: Bowel sounds are normal. There is no distension.     Palpations: Abdomen is soft. There is no mass.     Tenderness: There is no abdominal tenderness. There is no guarding  or rebound.  Musculoskeletal:        General: No tenderness. Normal range of motion.     Cervical back: Normal range of motion and neck supple.  Skin:    General: Skin is warm.  Neurological:     Mental Status: She is alert and oriented to person, place, and time.  Psychiatric:        Mood and Affect: Affect normal.       LABORATORY DATA:  I have reviewed the data as listed    Component Value Date/Time   NA 137 04/25/2023 0949   NA 138 04/25/2015 1210   NA 141 03/15/2012 1210   K 3.4 (L) 04/25/2023 0949   K 3.2 (L) 03/21/2012 1256   CL 103 04/25/2023 0949   CL 102 03/15/2012 1210   CO2 28 04/25/2023 0949   CO2 33 (H) 03/15/2012 1210   GLUCOSE 108 (H) 04/25/2023 0949   GLUCOSE 113 (H) 03/15/2012 1210   BUN 16 04/25/2023 0949   BUN 15 04/25/2015 1210   BUN 10 03/15/2012 1210   CREATININE 0.79 04/25/2023 0949   CREATININE 0.75 03/15/2012 1210   CALCIUM 9.5 04/25/2023 0949   CALCIUM 9.9 03/15/2012 1210   PROT 7.3 04/25/2023 0949   PROT 7.1 04/25/2015 1210   ALBUMIN 4.2 04/25/2023 0949   ALBUMIN 4.7 04/25/2015 1210   AST 21 04/25/2023 0949   ALT 18 04/25/2023 0949   ALKPHOS 69 04/25/2023 0949   BILITOT 0.4 04/25/2023 0949   BILITOT 0.3 04/25/2015 1210   GFRNONAA >60 04/25/2023 0949   GFRNONAA >60 03/15/2012 1210    GFRAA >60 10/24/2019 1005   GFRAA >60 03/15/2012 1210    No results found for: "SPEP", "UPEP"  Lab Results  Component Value Date   WBC 4.4 04/25/2023   NEUTROABS 2.6 04/25/2023   HGB 13.0 04/25/2023   HCT 40.0 04/25/2023   MCV 83.3 04/25/2023   PLT 214 04/25/2023      Chemistry      Component Value Date/Time   NA 137 04/25/2023 0949   NA 138 04/25/2015 1210   NA 141 03/15/2012 1210   K 3.4 (L) 04/25/2023 0949   K 3.2 (L) 03/21/2012 1256   CL 103 04/25/2023 0949   CL 102 03/15/2012 1210   CO2 28 04/25/2023 0949   CO2 33 (H) 03/15/2012 1210   BUN 16 04/25/2023 0949   BUN 15 04/25/2015 1210   BUN 10 03/15/2012 1210   CREATININE 0.79 04/25/2023 0949   CREATININE 0.75 03/15/2012 1210      Component Value Date/Time   CALCIUM 9.5 04/25/2023 0949   CALCIUM 9.9 03/15/2012 1210   ALKPHOS 69 04/25/2023 0949   AST 21 04/25/2023 0949   ALT 18 04/25/2023 0949   BILITOT 0.4 04/25/2023 0949   BILITOT 0.3 04/25/2015 1210       RADIOGRAPHIC STUDIES: I have personally reviewed the radiological images as listed and agreed with the findings in the report. No results found.   ASSESSMENT & PLAN:  Carcinoma of overlapping sites of right breast in female, estrogen receptor negative (HCC) # Right breast ipsilateral breast recurrence triple negative [Nov 2016] s/p mastectomy. DEC 2023- [facility] LEFT-WNL.  Awaiting mammogram in [facility] dec 2024 [Dr.Pabon].   No evidence of recurrence. Stable.   # Right - discomfort/right chest wall swelling-  recommend follow-up with maureen if worsens.   # right post mastectomy neuropathic pain-stable.  Onntinue NSAIDs PRN-  Stable.   #Osteopenia :[DEC 2023-] BMD T-score: of --  2.6  Continue calcium plus vitamin D. Discussed the potential risk factors for osteoporosis- age/gender/postmenopausal status/use of anti-estrogen treatments. Discussed multiple options including exercise/ calcium and vitamin D supplementation/ and also use of bisphosphonates.  Discussed oral bisphosphonates versus parenteral bisphosphonate like Reclast/ . Discussed the potential benefits and/side effects  Including but not limited to Osteonecrosis of jaw/ hypocalcemia. Patient wants to talk to PCP re: pills; and if interested in reclast- she will call us.   # DISPOSITION:  # follow-up in 12  months with labs-MD-cbc/cmp/ca-27-29-Dr.B.    CC: Dr.Hedrick, James/     Orders Placed This Encounter  Procedures   CMP (Cancer Center only)    Standing Status:   Future    Standing Expiration Date:   04/24/2024   CBC with Differential (Cancer Center Only)    Standing Status:   Future    Standing Expiration Date:   04/24/2024   Cancer antigen 27.29    Standing Status:   Future    Standing Expiration Date:   04/24/2024   All questions were answered. The patient knows to call the clinic with any problems, questions or concerns.      Julie Coder, MD 04/25/2023 11:13 AM

## 2023-04-25 NOTE — Progress Notes (Signed)
Appetite 75%, no supplements.

## 2023-04-25 NOTE — Assessment & Plan Note (Addendum)
#   Right breast ipsilateral breast recurrence triple negative [Nov 2016] s/p mastectomy. DEC 2023- [facility] LEFT-WNL.  Awaiting mammogram in [facility] dec 2024 [Dr.Pabon].   No evidence of recurrence. Stable.   # Right - discomfort/right chest wall swelling-  recommend follow-up with maureen if worsens.   # right post mastectomy neuropathic pain-stable.  Onntinue NSAIDs PRN-  Stable.   #Osteopenia :[DEC 2023-] BMD T-score: of --2.6  Continue calcium plus vitamin D. Discussed the potential risk factors for osteoporosis- age/gender/postmenopausal status/use of anti-estrogen treatments. Discussed multiple options including exercise/ calcium and vitamin D supplementation/ and also use of bisphosphonates. Discussed oral bisphosphonates versus parenteral bisphosphonate like Reclast/ . Discussed the potential benefits and/side effects  Including but not limited to Osteonecrosis of jaw/ hypocalcemia. Patient wants to talk to PCP re: pills; and if interested in reclast- she will call us.   # DISPOSITION:  # follow-up in 12  months with labs-MD-cbc/cmp/ca-27-29-Dr.B.    CC: Dr.Hedrick, James/

## 2023-04-26 LAB — CANCER ANTIGEN 27.29: CA 27.29: 12 U/mL (ref 0.0–38.6)

## 2023-05-24 ENCOUNTER — Other Ambulatory Visit: Payer: Self-pay

## 2023-05-24 DIAGNOSIS — Z1231 Encounter for screening mammogram for malignant neoplasm of breast: Secondary | ICD-10-CM

## 2023-07-08 ENCOUNTER — Ambulatory Visit
Admission: RE | Admit: 2023-07-08 | Discharge: 2023-07-08 | Disposition: A | Discharge status: Home / Self Care | Payer: Medicare HMO | Source: Ambulatory Visit | Attending: Surgery | Admitting: Surgery

## 2023-07-08 DIAGNOSIS — Z1231 Encounter for screening mammogram for malignant neoplasm of breast: Secondary | ICD-10-CM | POA: Insufficient documentation

## 2023-07-11 ENCOUNTER — Encounter: Payer: Self-pay | Admitting: Surgery

## 2023-07-11 ENCOUNTER — Ambulatory Visit (INDEPENDENT_AMBULATORY_CARE_PROVIDER_SITE_OTHER): Payer: Medicare HMO | Admitting: Surgery

## 2023-07-11 VITALS — BP 114/74 | HR 67 | Temp 98.0°F | Ht 62.0 in | Wt 168.0 lb

## 2023-07-11 DIAGNOSIS — Z853 Personal history of malignant neoplasm of breast: Secondary | ICD-10-CM | POA: Diagnosis not present

## 2023-07-11 DIAGNOSIS — Z1231 Encounter for screening mammogram for malignant neoplasm of breast: Secondary | ICD-10-CM

## 2023-07-11 NOTE — Patient Instructions (Addendum)
We will call you about your mammogram report.   The patient has been asked to return to the office in one year with a unilateral left breast screening mammogram. We will send you a letter about these appointments.   Continue self breast exams. Call office for any new breast issues or concerns.

## 2023-07-12 NOTE — Progress Notes (Signed)
Outpatient Surgical Follow Up   Julie [name] is an 82 y.o. female.   Chief Complaint  Patient presents with   Follow-up    HPI:  82 yo with a history of triple negative recurrent right breast cancer status post Modified radical mastectomy 2016 by Dr. Evette Cristal ( initial lumpectomy 2007 and she did have recurrence in the ipsilateral breast requiring mastectomy, She reports some fullness on lateral aspect of right , mastecomy site.   No fevers no chills she does self exams monthly. She Does have a history of abnormal calcifications left breast and now comes for yearly mammogram and physical exam.  I personally reviewed the mammogram showing stable calcifications  no other suspicious lesions.  Past Medical History:  Diagnosis Date   Anemia    Arthritis    Breast cancer (HCC) 2007   right breast cancer   Breast cancer (HCC) 2016   recurrent right breast cancer, triple negative   Cancer (HCC) 2007   Right breast   Cancer of breast (HCC) 04/30/2015   Diabetes mellitus without complication (HCC)    Family history of breast cancer    Family history of prostate cancer    GERD (gastroesophageal reflux disease)    Hypertension    Personal history of malignant neoplasm of breast    Pre-diabetes    Shortness of breath dyspnea     Past Surgical History:  Procedure Laterality Date   ABDOMINAL HYSTERECTOMY     APPENDECTOMY     BREAST BIOPSY Right 2007 and 2016   positive   BREAST EXCISIONAL BIOPSY Left    neg   BREAST LUMPECTOMY Right 2007   CATARACT EXTRACTION, BILATERAL Bilateral    COLONOSCOPY WITH PROPOFOL N/A 06/14/2018   Procedure: COLONOSCOPY WITH PROPOFOL;  Surgeon: Toledo, Boykin Nearing, MD;  Location: [facility];  Service: Gastroenterology;  Laterality: N/A;   JOINT REPLACEMENT Left 2011   knee   JOINT REPLACEMENT Right 12/2011   knee   KNEE SURGERY Right 03/2012   MASTECTOMY Right 2016   MASTECTOMY MODIFIED RADICAL Right 05/22/2015   Procedure: MASTECTOMY MODIFIED  RADICAL;  Surgeon: Kieth Brightly, MD;  Location: [facility];  Service: General;  Laterality: Right;    Family History  Problem Relation Age of Onset   Breast cancer Mother        dx late 65s   Heart attack Father    Heart attack Sister 71   Prostate cancer Brother        dx 64s   Dementia Paternal Grandfather     Social History:  reports that she quit smoking about 43 years ago. Her smoking use included cigarettes. She started smoking about 45 years ago. She has a 1.5 pack-year smoking history. She has been exposed to tobacco smoke. She has never used smokeless tobacco. She reports that she does not drink alcohol and does not use drugs.  Allergies:  Allergies  Allergen Reactions   Gabapentin Other (See Comments)    dizziness   Penicillin G Rash    Medications reviewed.    ROS Full ROS performed and is otherwise negative other than what is stated in HPI   BP 114/74   Pulse 67   Temp 98 F (36.7 C)   Ht 5\' 2"  (1.575 m)   Wt 168 lb (76.2 kg)   SpO2 99%   BMI 30.73 kg/m   Physical Exam Vitals and nursing note reviewed. Exam conducted with a chaperone present.  Constitutional:  Appearance: Normal appearance. She is normal weight.  Eyes:     General: No scleral icterus.       Right eye: No discharge.        Left eye: No discharge.  Cardiovascular:     Rate and Rhythm: Normal rate.     Pulses: Normal pulses.     Heart sounds: No murmur.  Pulmonary:     Effort: Pulmonary effort is normal. No respiratory distress.     Breath sounds: Normal breath sounds. No stridor.     Comments: BREAST: Right mastectomy scar, no new chest wall lesions, No masses on the left, no LAD. Left breast w/o any palpable lesion, nml nipple Abdominal:     General: Abdomen is flat. There is no distension.     Palpations: There is no mass.     Tenderness: There is no abdominal tenderness.     Hernia: No hernia is present.  Musculoskeletal:     Cervical back: Normal range of motion  and neck supple. No rigidity or tenderness.  Skin:    Capillary Refill: Capillary refill takes less than 2 seconds.  Neurological:     General: No focal deficit present.     Mental Status: She is alert and oriented to person, place, and time.  Psychiatric:        Mood and Affect: Mood normal.        Thought Content: Thought content normal.       Judgment: Judgment normal.      Assessment/Plan:   81 year old with prior history of right breast cancer status post mastectomy.  Currently there is no evidence of recurrent chest wall lesions and there is no evidence of any pathology on the left breast. I do not see evidence of complications related to remote mastectomy    Recommend yearly mammogram with physical exam.  No need for further interventions at this time PLease note that I spent 30 minutes including personally reviewing imaging studies, personally reviewing the records, counseling the patient coordinating her care, placing orders and performing appropriate documentation    Sterling Big, MD [facility] General Surgeon

## 2023-07-21 DIAGNOSIS — E119 Type 2 diabetes mellitus without complications: Secondary | ICD-10-CM | POA: Diagnosis not present

## 2023-07-25 DIAGNOSIS — I495 Sick sinus syndrome: Secondary | ICD-10-CM | POA: Diagnosis not present

## 2023-07-25 DIAGNOSIS — I10 Essential (primary) hypertension: Secondary | ICD-10-CM | POA: Diagnosis not present

## 2023-07-25 DIAGNOSIS — M810 Age-related osteoporosis without current pathological fracture: Secondary | ICD-10-CM | POA: Diagnosis not present

## 2023-07-25 DIAGNOSIS — J309 Allergic rhinitis, unspecified: Secondary | ICD-10-CM | POA: Diagnosis not present

## 2023-07-25 DIAGNOSIS — R42 Dizziness and giddiness: Secondary | ICD-10-CM | POA: Diagnosis not present

## 2023-07-25 DIAGNOSIS — E785 Hyperlipidemia, unspecified: Secondary | ICD-10-CM | POA: Diagnosis not present

## 2023-07-25 DIAGNOSIS — Z0000 Encounter for general adult medical examination without abnormal findings: Secondary | ICD-10-CM | POA: Diagnosis not present

## 2023-07-25 DIAGNOSIS — Z171 Estrogen receptor negative status [ER-]: Secondary | ICD-10-CM | POA: Diagnosis not present

## 2023-07-25 DIAGNOSIS — E119 Type 2 diabetes mellitus without complications: Secondary | ICD-10-CM | POA: Diagnosis not present

## 2023-07-25 DIAGNOSIS — C50811 Malignant neoplasm of overlapping sites of right female breast: Secondary | ICD-10-CM | POA: Diagnosis not present

## 2023-07-25 DIAGNOSIS — E538 Deficiency of other specified B group vitamins: Secondary | ICD-10-CM | POA: Diagnosis not present

## 2023-08-08 DIAGNOSIS — E538 Deficiency of other specified B group vitamins: Secondary | ICD-10-CM | POA: Diagnosis not present

## 2023-08-09 DIAGNOSIS — I10 Essential (primary) hypertension: Secondary | ICD-10-CM | POA: Diagnosis not present

## 2023-08-09 DIAGNOSIS — E119 Type 2 diabetes mellitus without complications: Secondary | ICD-10-CM | POA: Diagnosis not present

## 2023-08-09 DIAGNOSIS — I495 Sick sinus syndrome: Secondary | ICD-10-CM | POA: Diagnosis not present

## 2023-08-09 DIAGNOSIS — R0602 Shortness of breath: Secondary | ICD-10-CM | POA: Diagnosis not present

## 2023-09-05 DIAGNOSIS — E538 Deficiency of other specified B group vitamins: Secondary | ICD-10-CM | POA: Diagnosis not present

## 2023-10-07 DIAGNOSIS — E538 Deficiency of other specified B group vitamins: Secondary | ICD-10-CM | POA: Diagnosis not present

## 2023-10-10 DIAGNOSIS — M810 Age-related osteoporosis without current pathological fracture: Secondary | ICD-10-CM | POA: Diagnosis not present

## 2023-10-10 DIAGNOSIS — Z853 Personal history of malignant neoplasm of breast: Secondary | ICD-10-CM | POA: Diagnosis not present

## 2023-10-10 DIAGNOSIS — E119 Type 2 diabetes mellitus without complications: Secondary | ICD-10-CM | POA: Diagnosis not present

## 2023-10-31 DIAGNOSIS — M810 Age-related osteoporosis without current pathological fracture: Secondary | ICD-10-CM | POA: Diagnosis not present

## 2023-10-31 DIAGNOSIS — E210 Primary hyperparathyroidism: Secondary | ICD-10-CM | POA: Diagnosis not present

## 2023-11-04 DIAGNOSIS — E538 Deficiency of other specified B group vitamins: Secondary | ICD-10-CM | POA: Diagnosis not present

## 2023-12-02 DIAGNOSIS — E538 Deficiency of other specified B group vitamins: Secondary | ICD-10-CM | POA: Diagnosis not present

## 2023-12-08 DIAGNOSIS — Z008 Encounter for other general examination: Secondary | ICD-10-CM | POA: Diagnosis not present

## 2023-12-08 DIAGNOSIS — Z6832 Body mass index (BMI) 32.0-32.9, adult: Secondary | ICD-10-CM | POA: Diagnosis not present

## 2023-12-08 DIAGNOSIS — I10 Essential (primary) hypertension: Secondary | ICD-10-CM | POA: Diagnosis not present

## 2023-12-08 DIAGNOSIS — E669 Obesity, unspecified: Secondary | ICD-10-CM | POA: Diagnosis not present

## 2023-12-08 DIAGNOSIS — E785 Hyperlipidemia, unspecified: Secondary | ICD-10-CM | POA: Diagnosis not present

## 2023-12-08 DIAGNOSIS — E1169 Type 2 diabetes mellitus with other specified complication: Secondary | ICD-10-CM | POA: Diagnosis not present

## 2023-12-19 ENCOUNTER — Telehealth: Payer: Self-pay | Admitting: Surgery

## 2023-12-19 NOTE — Telephone Encounter (Signed)
 Patient last saw Dr. Dana Duncan in office on 07/11/23.  She had mastectomy done 8 years ago.  Patient is needing a prescription sent for a breast insert to [facility] Their phone number is [phone].  Patient says the one she has is 84 years old and now worn out. Can't get a new one until we send prescription for one.  Please call patient with status regarding this request.

## 2023-12-19 NOTE — Telephone Encounter (Signed)
 New order for right bra prosthesis and mastectomy bras has been faxed to [facility]. Attempted to call the patient back, but no answer. Left a voicemail letting her know this has been sent.

## 2024-01-06 DIAGNOSIS — E538 Deficiency of other specified B group vitamins: Secondary | ICD-10-CM | POA: Diagnosis not present

## 2024-01-10 ENCOUNTER — Telehealth: Payer: Self-pay | Admitting: Surgery

## 2024-01-10 NOTE — Telephone Encounter (Signed)
 Patient states that the order for her breast prosthesis will not be covered by [facility].  She needs for a new order to be faxed to [employer] in [place], [place].  She did not have a fax number.  Two numbers she gave for [place] is [phone] and [phone].  Please call patient and let her know when this has been faxed.

## 2024-01-16 DIAGNOSIS — E785 Hyperlipidemia, unspecified: Secondary | ICD-10-CM | POA: Diagnosis not present

## 2024-01-16 DIAGNOSIS — E119 Type 2 diabetes mellitus without complications: Secondary | ICD-10-CM | POA: Diagnosis not present

## 2024-01-16 DIAGNOSIS — I10 Essential (primary) hypertension: Secondary | ICD-10-CM | POA: Diagnosis not present

## 2024-01-26 DIAGNOSIS — C50911 Malignant neoplasm of unspecified site of right female breast: Secondary | ICD-10-CM | POA: Diagnosis not present

## 2024-01-31 DIAGNOSIS — C50911 Malignant neoplasm of unspecified site of right female breast: Secondary | ICD-10-CM | POA: Diagnosis not present

## 2024-02-03 DIAGNOSIS — E538 Deficiency of other specified B group vitamins: Secondary | ICD-10-CM | POA: Diagnosis not present

## 2024-02-14 DIAGNOSIS — C50911 Malignant neoplasm of unspecified site of right female breast: Secondary | ICD-10-CM | POA: Diagnosis not present

## 2024-02-21 DIAGNOSIS — I10 Essential (primary) hypertension: Secondary | ICD-10-CM | POA: Diagnosis not present

## 2024-02-21 DIAGNOSIS — Z1331 Encounter for screening for depression: Secondary | ICD-10-CM | POA: Diagnosis not present

## 2024-02-21 DIAGNOSIS — E119 Type 2 diabetes mellitus without complications: Secondary | ICD-10-CM | POA: Diagnosis not present

## 2024-02-21 DIAGNOSIS — Z0000 Encounter for general adult medical examination without abnormal findings: Secondary | ICD-10-CM | POA: Diagnosis not present

## 2024-02-21 DIAGNOSIS — E785 Hyperlipidemia, unspecified: Secondary | ICD-10-CM | POA: Diagnosis not present

## 2024-03-02 DIAGNOSIS — E538 Deficiency of other specified B group vitamins: Secondary | ICD-10-CM | POA: Diagnosis not present

## 2024-03-05 DIAGNOSIS — C50911 Malignant neoplasm of unspecified site of right female breast: Secondary | ICD-10-CM | POA: Diagnosis not present

## 2024-03-13 DIAGNOSIS — E119 Type 2 diabetes mellitus without complications: Secondary | ICD-10-CM | POA: Diagnosis not present

## 2024-03-13 DIAGNOSIS — I495 Sick sinus syndrome: Secondary | ICD-10-CM | POA: Diagnosis not present

## 2024-03-13 DIAGNOSIS — I10 Essential (primary) hypertension: Secondary | ICD-10-CM | POA: Diagnosis not present

## 2024-03-20 ENCOUNTER — Telehealth: Payer: Self-pay | Admitting: Internal Medicine

## 2024-03-20 NOTE — Telephone Encounter (Signed)
 Called patient to reschedule appointment due to provider being out of office- spoke to her and changed appointment she states she wrote appointment down and will call if something comes up and she can not make it. Mailed appointment reminder

## 2024-04-06 DIAGNOSIS — E538 Deficiency of other specified B group vitamins: Secondary | ICD-10-CM | POA: Diagnosis not present

## 2024-04-09 DIAGNOSIS — I495 Sick sinus syndrome: Secondary | ICD-10-CM | POA: Diagnosis not present

## 2024-04-12 DIAGNOSIS — I10 Essential (primary) hypertension: Secondary | ICD-10-CM | POA: Diagnosis not present

## 2024-04-12 DIAGNOSIS — R0602 Shortness of breath: Secondary | ICD-10-CM | POA: Diagnosis not present

## 2024-04-12 DIAGNOSIS — E119 Type 2 diabetes mellitus without complications: Secondary | ICD-10-CM | POA: Diagnosis not present

## 2024-04-12 DIAGNOSIS — I495 Sick sinus syndrome: Secondary | ICD-10-CM | POA: Diagnosis not present

## 2024-04-12 DIAGNOSIS — R600 Localized edema: Secondary | ICD-10-CM | POA: Diagnosis not present

## 2024-04-20 ENCOUNTER — Encounter: Payer: Self-pay | Admitting: Nurse Practitioner

## 2024-04-20 ENCOUNTER — Inpatient Hospital Stay (HOSPITAL_BASED_OUTPATIENT_CLINIC_OR_DEPARTMENT_OTHER): Admitting: Nurse Practitioner

## 2024-04-20 ENCOUNTER — Ambulatory Visit: Admitting: Nurse Practitioner

## 2024-04-20 ENCOUNTER — Other Ambulatory Visit

## 2024-04-20 ENCOUNTER — Inpatient Hospital Stay: Attending: Internal Medicine

## 2024-04-20 VITALS — BP 145/80 | HR 70 | Temp 96.6°F | Resp 16 | Ht 62.0 in | Wt 169.2 lb

## 2024-04-20 DIAGNOSIS — Z171 Estrogen receptor negative status [ER-]: Secondary | ICD-10-CM

## 2024-04-20 DIAGNOSIS — Z8042 Family history of malignant neoplasm of prostate: Secondary | ICD-10-CM | POA: Insufficient documentation

## 2024-04-20 DIAGNOSIS — C50811 Malignant neoplasm of overlapping sites of right female breast: Secondary | ICD-10-CM | POA: Diagnosis not present

## 2024-04-20 DIAGNOSIS — Z08 Encounter for follow-up examination after completed treatment for malignant neoplasm: Secondary | ICD-10-CM | POA: Insufficient documentation

## 2024-04-20 DIAGNOSIS — M25511 Pain in right shoulder: Secondary | ICD-10-CM

## 2024-04-20 DIAGNOSIS — G8918 Other acute postprocedural pain: Secondary | ICD-10-CM | POA: Insufficient documentation

## 2024-04-20 DIAGNOSIS — Z87891 Personal history of nicotine dependence: Secondary | ICD-10-CM | POA: Insufficient documentation

## 2024-04-20 DIAGNOSIS — M8580 Other specified disorders of bone density and structure, unspecified site: Secondary | ICD-10-CM | POA: Diagnosis not present

## 2024-04-20 DIAGNOSIS — Z79899 Other long term (current) drug therapy: Secondary | ICD-10-CM | POA: Diagnosis not present

## 2024-04-20 DIAGNOSIS — Z803 Family history of malignant neoplasm of breast: Secondary | ICD-10-CM | POA: Insufficient documentation

## 2024-04-20 DIAGNOSIS — Z853 Personal history of malignant neoplasm of breast: Secondary | ICD-10-CM | POA: Insufficient documentation

## 2024-04-20 DIAGNOSIS — Z9011 Acquired absence of right breast and nipple: Secondary | ICD-10-CM | POA: Insufficient documentation

## 2024-04-20 LAB — CBC WITH DIFFERENTIAL (CANCER CENTER ONLY)
Abs Immature Granulocytes: 0.02 K/uL (ref 0.00–0.07)
Basophils Absolute: 0 K/uL (ref 0.0–0.1)
Basophils Relative: 1 %
Eosinophils Absolute: 0.1 K/uL (ref 0.0–0.5)
Eosinophils Relative: 1 %
HCT: 43.9 % (ref 36.0–46.0)
Hemoglobin: 13.8 g/dL (ref 12.0–15.0)
Immature Granulocytes: 0 %
Lymphocytes Relative: 20 %
Lymphs Abs: 1.1 K/uL (ref 0.7–4.0)
MCH: 26.6 pg (ref 26.0–34.0)
MCHC: 31.4 g/dL (ref 30.0–36.0)
MCV: 84.7 fL (ref 80.0–100.0)
Monocytes Absolute: 0.5 K/uL (ref 0.1–1.0)
Monocytes Relative: 9 %
Neutro Abs: 3.8 K/uL (ref 1.7–7.7)
Neutrophils Relative %: 69 %
Platelet Count: 283 K/uL (ref 150–400)
RBC: 5.18 MIL/uL — ABNORMAL HIGH (ref 3.87–5.11)
RDW: 14.6 % (ref 11.5–15.5)
WBC Count: 5.6 K/uL (ref 4.0–10.5)
nRBC: 0 % (ref 0.0–0.2)

## 2024-04-20 LAB — CMP (CANCER CENTER ONLY)
ALT: 18 U/L (ref 0–44)
AST: 23 U/L (ref 15–41)
Albumin: 4.4 g/dL (ref 3.5–5.0)
Alkaline Phosphatase: 78 U/L (ref 38–126)
Anion gap: 11 (ref 5–15)
BUN: 14 mg/dL (ref 8–23)
CO2: 27 mmol/L (ref 22–32)
Calcium: 9.8 mg/dL (ref 8.9–10.3)
Chloride: 97 mmol/L — ABNORMAL LOW (ref 98–111)
Creatinine: 0.79 mg/dL (ref 0.44–1.00)
GFR, Estimated: >60 mL/min (ref >60)
Glucose, Bld: 94 mg/dL (ref 70–99)
Potassium: 3.5 mmol/L (ref 3.5–5.1)
Sodium: 135 mmol/L (ref 135–145)
Total Bilirubin: 0.6 mg/dL (ref 0.0–1.2)
Total Protein: 7.4 g/dL (ref 6.5–8.1)

## 2024-04-20 NOTE — Progress Notes (Signed)
 [facility] OFFICE PROGRESS NOTE  Patient Care Team: Stanton Lynwood FALCON, MD as PCP - General (Family Medicine) Dellie Louanne MATSU, MD (General Surgery) Rennie Cindy SAUNDERS, [name]  CC: Breast cancer surveillance   Cancer Staging  No matching staging information was found for the patient.  Oncology History Overview Note  1. Carcinoma of the breast, status post lumpectomy and sentinel lymph node biopsy. June 2007 ER/PR negative, Her-2 negative. PT2N1(mic)M0, STAGE IIb 2. Completed 6 cycles Cytoxan/Taxotere October 2007. Right breast XRT. 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 4. Right mastectomy, May 22, 2015. Location of mass was at 9:00. All the margins were clear. 4 lymph nodes were negative for any tumor. Invasive mammary carcinoma with apocrine features.  Margins are negative. Informal vascular invasion present, rpT2   pNO cMO (triple negative). --------------------------------------------   DIAGNOSIS: Breast cancer  STAGE:  II   ;GOALS: cure  CURRENT/MOST RECENT THERAPY : survielleince    Carcinoma of overlapping sites of right breast in female, estrogen receptor negative (HCC)  04/05/2016 Initial Diagnosis   Cancer of overlapping sites of right female breast (HCC)     INTERVAL HISTORY: Ambulating independently.  Alone.  Julie [name] 83 y.o. 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  She had her December 2024 mammogram and clinical exam with Dr Jordis.  She complains of right shoulder blade pain that is new in the past few weeks.  Has persisted and wakes her from her sleep.  She feels that the right chest wall fullness is resolved.  Denies any new rashes.  No  headaches, otherwise no complaints of bone pain or unintentional weight loss.  Denies nausea or vomiting.  She has some chronic aches and pains of her joints that are unchanged.  Currently feeling stressed given packing & moving.   Review of Systems  Constitutional:  Negative for chills, diaphoresis, fever, malaise/fatigue and weight loss.  HENT:  Negative for nosebleeds and sore throat.   Eyes:  Negative for double vision.  Respiratory:  Negative for cough, hemoptysis, sputum production, shortness of breath and wheezing.   Cardiovascular:  Negative for chest pain, palpitations, orthopnea and leg swelling.  Gastrointestinal:  Negative for abdominal pain, blood in stool, constipation, diarrhea, heartburn, melena, nausea and vomiting.  Genitourinary:  Negative for dysuria, frequency and urgency.  Musculoskeletal:  Positive for back pain, joint pain and myalgias.  Skin: Negative.  Negative for itching and rash.  Neurological:  Negative for dizziness, tingling, focal weakness, weakness and headaches.  Endo/Heme/Allergies:  Does not bruise/bleed easily.  Psychiatric/Behavioral:  Negative for depression. The patient is not nervous/anxious and does not have insomnia.    PAST MEDICAL HISTORY :  Past Medical History:  Diagnosis Date   Anemia    Arthritis    Breast cancer (HCC) 2007   right breast cancer   Breast cancer (HCC) 2016   recurrent right breast cancer, triple negative   Cancer (HCC) 2007   Right breast   Cancer of breast (HCC) 04/30/2015   Diabetes mellitus without complication (HCC)    Family history of breast cancer    Family history of prostate cancer    GERD (gastroesophageal reflux disease)    Hypertension    Personal history of malignant neoplasm of  breast    Pre-diabetes    Shortness of breath dyspnea    PAST SURGICAL HISTORY :   Past Surgical History:  Procedure Laterality Date   ABDOMINAL HYSTERECTOMY     APPENDECTOMY     BREAST BIOPSY Right 2007 and 2016    positive   BREAST EXCISIONAL BIOPSY Left    neg   BREAST LUMPECTOMY Right 2007   CATARACT EXTRACTION, BILATERAL Bilateral    COLONOSCOPY WITH PROPOFOL  N/A 06/14/2018   Procedure: COLONOSCOPY WITH PROPOFOL ;  Surgeon: Toledo, Ladell POUR, MD;  Location: [facility];  Service: Gastroenterology;  Laterality: N/A;   JOINT REPLACEMENT Left 2011   knee   JOINT REPLACEMENT Right 12/2011   knee   KNEE SURGERY Right 03/2012   MASTECTOMY Right 2016   MASTECTOMY MODIFIED RADICAL Right 05/22/2015   Procedure: MASTECTOMY MODIFIED RADICAL;  Surgeon: Louanne KANDICE Muse, MD;  Location: [facility] ORS;  Service: General;  Laterality: Right;   FAMILY HISTORY :   Family History  Problem Relation Age of Onset   Breast cancer Mother        dx late 15s   Heart attack Father    Heart attack Sister 70   Prostate cancer Brother        dx 16s   Dementia Paternal Grandfather    SOCIAL HISTORY:   Social History   Tobacco Use   Smoking status: Former    Current packs/day: 0.00    Average packs/day: 0.8 packs/day for 2.0 years (1.5 ttl pk-yrs)    Types: Cigarettes    Start date: 05/15/1978    Quit date: 05/15/1980    Years since quitting: 43.9    Passive exposure: Past   Smokeless tobacco: Never  Vaping Use   Vaping status: Never Used  Substance Use Topics   Alcohol use: No   Drug use: No   ALLERGIES:  is allergic to gabapentin  and penicillin g.  MEDICATIONS:  Current Outpatient Medications  Medication Sig Dispense Refill   amLODipine (NORVASC) 10 MG tablet Take 1 tablet by mouth daily.     Cholecalciferol 50 MCG (2000 UT) CAPS Take 1 tablet by mouth daily.     colestipol (COLESTID) 1 g tablet Take 2 g by mouth 2 (two) times daily.     cyanocobalamin (,VITAMIN B-12,) 1000 MCG/ML injection Inject 1,000 mcg into the muscle every 30 (thirty) days.     Potassium Chloride ER 20 MEQ TBCR Take 1 tablet by mouth 2 (two) times daily.     simvastatin (ZOCOR) 20 MG tablet Take 20 mg by mouth daily at 6 PM.       triamterene-hydrochlorothiazide (MAXZIDE-25) 37.5-25 MG per tablet Take 1 tablet by mouth daily.      No current facility-administered medications for this visit.    PHYSICAL EXAMINATION: ECOG PERFORMANCE STATUS: 1 - Symptomatic but completely ambulatory  BP (!) 145/80 (BP Location: Left Arm, Patient Position: Sitting, Cuff Size: Normal) Comment: pt advised bp elevated, keep check at home, contact pcp if con't to stay elevated  Pulse 70   Temp (!) 96.6 F (35.9 C) (Tympanic)   Resp 16   Ht 5' 2 (1.575 m)   Wt 169 lb 3.2 oz (76.7 kg)   SpO2 98%   BMI 30.95 kg/m   Filed Weights   04/20/24 1042  Weight: 169 lb 3.2 oz (76.7 kg)   Physical Exam Vitals reviewed.  Constitutional:      Appearance: She is not ill-appearing.  HENT:     Head: Normocephalic  and atraumatic.     Mouth/Throat:     Pharynx: No oropharyngeal exudate.  Cardiovascular:     Rate and Rhythm: Normal rate and regular rhythm.  Pulmonary:     Effort: No respiratory distress.     Breath sounds: No wheezing.  Chest:     Comments: Breast exam deferred today. Abdominal:     General: There is no distension.     Palpations: Abdomen is soft.     Tenderness: There is no abdominal tenderness. There is no guarding.  Musculoskeletal:        General: Tenderness (Focal tenderness to deep palpation of her right shoulder blade) present. No deformity.  Skin:    General: Skin is warm.     Coloration: Skin is not pale.  Neurological:     Mental Status: She is alert and oriented to person, place, and time.  Psychiatric:        Mood and Affect: Mood and affect normal.        Behavior: Behavior normal.    LABORATORY DATA:  I have reviewed the data as listed    Component Value Date/Time   NA 137 04/25/2023 0949   NA 138 04/25/2015 1210   NA 141 03/15/2012 1210   K 3.4 (L) 04/25/2023 0949   K 3.2 (L) 03/21/2012 1256   CL 103 04/25/2023 0949   CL 102 03/15/2012 1210   CO2 28 04/25/2023 0949   CO2 33 (H)  03/15/2012 1210   GLUCOSE 108 (H) 04/25/2023 0949   GLUCOSE 113 (H) 03/15/2012 1210   BUN 16 04/25/2023 0949   BUN 15 04/25/2015 1210   BUN 10 03/15/2012 1210   CREATININE 0.79 04/25/2023 0949   CREATININE 0.75 03/15/2012 1210   CALCIUM 9.5 04/25/2023 0949   CALCIUM 9.9 03/15/2012 1210   PROT 7.3 04/25/2023 0949   PROT 7.1 04/25/2015 1210   ALBUMIN 4.2 04/25/2023 0949   ALBUMIN 4.7 04/25/2015 1210   AST 21 04/25/2023 0949   ALT 18 04/25/2023 0949   ALKPHOS 69 04/25/2023 0949   BILITOT 0.4 04/25/2023 0949   BILITOT 0.3 04/25/2015 1210   GFRNONAA >60 04/25/2023 0949   GFRNONAA >60 03/15/2012 1210   GFRAA >60 10/24/2019 1005   GFRAA >60 03/15/2012 1210   Lab Results  Component Value Date   WBC 5.6 04/20/2024   NEUTROABS 3.8 04/20/2024   HGB 13.8 04/20/2024   HCT 43.9 04/20/2024   MCV 84.7 04/20/2024   PLT 283 04/20/2024     Chemistry      Component Value Date/Time   NA 137 04/25/2023 0949   NA 138 04/25/2015 1210   NA 141 03/15/2012 1210   K 3.4 (L) 04/25/2023 0949   K 3.2 (L) 03/21/2012 1256   CL 103 04/25/2023 0949   CL 102 03/15/2012 1210   CO2 28 04/25/2023 0949   CO2 33 (H) 03/15/2012 1210   BUN 16 04/25/2023 0949   BUN 15 04/25/2015 1210   BUN 10 03/15/2012 1210   CREATININE 0.79 04/25/2023 0949   CREATININE 0.75 03/15/2012 1210      Component Value Date/Time   CALCIUM 9.5 04/25/2023 0949   CALCIUM 9.9 03/15/2012 1210   ALKPHOS 69 04/25/2023 0949   AST 21 04/25/2023 0949   ALT 18 04/25/2023 0949   BILITOT 0.4 04/25/2023 0949   BILITOT 0.3 04/25/2015 1210     Component Ref Range & Units (hover) 12 mo ago (04/25/23) 1 yr ago (04/23/22) 2 yr ago (04/23/21) 3 yr ago (  04/23/20) 4 yr ago (10/24/19) 5 yr ago (12/25/18) 5 yr ago (06/26/18)  CA 27.29 12.0 9.1 CM 10.2 CM 7.6 CM 14.7 CM 9.9 CM 5.7     RADIOGRAPHIC STUDIES: I have personally reviewed the radiological images as listed and agreed with the findings in the report. No results found.    ASSESSMENT & PLAN:   Carcinoma of overlapping sites of right breast in female, estrogen receptor negative  # Right breast ipsilateral breast recurrence triple negative [Nov 2016] s/p mastectomy. DEC 2023- UNI LEFT-WNL.  She had a unilateral left screening mammogram in December 2024 with Dr. Jordis which was reported as BI-RADS Category 1: Negative.  Clinically asymptomatic of recurrent disease today.  No indication for surveillance imaging of the right breast given mastectomy.  Continue annual surveillance imaging of the left breast with surgery.  No role for endocrine therapy given triple negative disease.  CA 27-29 is pending at time of dictation.  # Right shoulder pain-given her history of breast cancer recommend a bone scan to evaluate for metastatic disease.  Clinically I question an MSK etiology particularly if she is packing and preparing to move. If negative and the pain persists, would consider additional imaging including possible MRI.  # Right chest wall discomfort and edema-now resolved.  Consider evaluation with monitoring if recurrent symptoms.   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  On NSAIDs as needed.  # Osteopenia-December 2023 BMD T-score of -2.6.  She has a repeat bone density scan planned for later this year.  We previously discussed oral bisphosphonates versus parenteral bisphosphonate such as Reclast.  She opted to hold off on treatment for now.  She will follow-up with PCP and if worsening scans she says that she may consider oral pills with PCP.  She can call us  if she decides she is interested in parenteral options.   # right post mastectomy neuropathic pain-stable.  Onntinue NSAIDs PRN-  Stable.     # DISPOSITION:  Bone scan 12 months- labs (cbc, cmp, ca27-29), Dr Rennie- [place]   No problem-specific Assessment & Plan notes found for this encounter.  Orders Placed This Encounter  Procedures   NM Bone Scan Whole Body    Standing  Status:   Future    Expected Date:   04/27/2024    Expiration Date:   04/20/2025    If indicated for the ordered procedure, I authorize the administration of a radiopharmaceutical per Radiology protocol:   Yes    Preferred imaging location?:   [facility]   CBC with Differential (Cancer Center Only)    Standing Status:   Future    Expected Date:   04/19/2025    Expiration Date:   07/18/2025   CMP (Cancer Center only)    Standing Status:   Future    Expected Date:   04/19/2025    Expiration Date:   07/18/2025   Cancer antigen 27.29    Standing Status:   Future    Expected Date:   04/19/2025    Expiration Date:   07/18/2025   All questions were answered. The patient knows to call the clinic with any problems, questions or concerns.   Tinnie KANDICE Dawn, NP 04/20/2024

## 2024-04-20 NOTE — Progress Notes (Signed)
 C/o pain in her back/shoulder area, right side, off and on.

## 2024-04-21 LAB — CANCER ANTIGEN 27.29: CA 27.29: 10.1 U/mL (ref 0.0–38.6)

## 2024-04-24 ENCOUNTER — Other Ambulatory Visit: Payer: Medicare HMO

## 2024-04-24 ENCOUNTER — Ambulatory Visit: Payer: Medicare HMO | Admitting: Internal Medicine

## 2024-04-27 ENCOUNTER — Ambulatory Visit
Admission: RE | Admit: 2024-04-27 | Discharge: 2024-04-27 | Disposition: A | Discharge status: Home / Self Care | Source: Ambulatory Visit | Attending: Nurse Practitioner | Admitting: Nurse Practitioner

## 2024-04-27 DIAGNOSIS — M25511 Pain in right shoulder: Secondary | ICD-10-CM | POA: Insufficient documentation

## 2024-04-27 DIAGNOSIS — C50811 Malignant neoplasm of overlapping sites of right female breast: Secondary | ICD-10-CM | POA: Insufficient documentation

## 2024-04-27 DIAGNOSIS — Z171 Estrogen receptor negative status [ER-]: Secondary | ICD-10-CM | POA: Insufficient documentation

## 2024-04-27 MED ORDER — TECHNETIUM TC 99M MEDRONATE IV KIT
20.0000 | PACK | Freq: Once | INTRAVENOUS | Status: AC | PRN
  Administered 2024-04-27: 21.8 via INTRAVENOUS

## 2024-05-04 ENCOUNTER — Telehealth: Payer: Self-pay | Admitting: *Deleted

## 2024-05-04 NOTE — Telephone Encounter (Signed)
 Notified patient by phone of her normal bone scan results. If she has ongoing pain, Tinnie Dawn NP recommend workup, possible MRI, but defer to ortho or PCP for that workup.  Pt very appreciative of results.

## 2024-05-08 ENCOUNTER — Other Ambulatory Visit: Payer: Self-pay

## 2024-05-08 DIAGNOSIS — Z1231 Encounter for screening mammogram for malignant neoplasm of breast: Secondary | ICD-10-CM

## 2024-07-09 ENCOUNTER — Ambulatory Visit
Admission: RE | Admit: 2024-07-09 | Discharge: 2024-07-09 | Disposition: A | Discharge status: Home / Self Care | Source: Ambulatory Visit | Attending: Surgery | Admitting: Surgery

## 2024-07-09 DIAGNOSIS — Z9011 Acquired absence of right breast and nipple: Secondary | ICD-10-CM | POA: Insufficient documentation

## 2024-07-09 DIAGNOSIS — Z1231 Encounter for screening mammogram for malignant neoplasm of breast: Secondary | ICD-10-CM | POA: Diagnosis present

## 2024-07-25 ENCOUNTER — Encounter: Payer: Self-pay | Admitting: Surgery

## 2024-07-25 ENCOUNTER — Ambulatory Visit: Admitting: Surgery

## 2024-07-25 VITALS — BP 125/66 | HR 58 | Ht 62.0 in | Wt 161.0 lb

## 2024-07-25 DIAGNOSIS — Z853 Personal history of malignant neoplasm of breast: Secondary | ICD-10-CM | POA: Diagnosis not present

## 2024-07-25 DIAGNOSIS — R921 Mammographic calcification found on diagnostic imaging of breast: Secondary | ICD-10-CM | POA: Diagnosis not present

## 2024-07-25 NOTE — Patient Instructions (Signed)
 We will see you back in a year for another following with Dr Jordis and to have a mammogram. In the mean time if you have any questions or concerns please call our office.

## 2024-07-26 ENCOUNTER — Encounter: Payer: Self-pay | Admitting: Surgery

## 2024-07-26 NOTE — Progress Notes (Signed)
 Outpatient Surgical Follow Up    Julie [name] is an 84 y.o. female.   Chief Complaint  Patient presents with   Follow-up    HPI: Julie [name] is a very pleasant 84 year old female with a history of triple negative recurrent right breast cancer status post Modified radical mastectomy 2016 by Dr. Dellie ( initial lumpectomy 2007 and she did have recurrence in the ipsilateral breast requiring mastectomy, she endorses no complaints today.   No fevers no chills she does self exams monthly. She Does have a history of abnormal calcifications left breast and now comes for yearly mammogram and physical exam.  I personally reviewed the mammogram showing stable calcifications  no other suspicious lesions.    Past Medical History:  Diagnosis Date   Anemia    Arthritis    Breast cancer (HCC) 2007   right breast cancer   Breast cancer (HCC) 2016   recurrent right breast cancer, triple negative   Cancer (HCC) 2007   Right breast   Cancer of breast (HCC) 04/30/2015   Diabetes mellitus without complication (HCC)    Family history of breast cancer    Family history of prostate cancer    GERD (gastroesophageal reflux disease)    Hypertension    Personal history of malignant neoplasm of breast    Pre-diabetes    Shortness of breath dyspnea     Past Surgical History:  Procedure Laterality Date   ABDOMINAL HYSTERECTOMY     APPENDECTOMY     BREAST BIOPSY Right 2007 and 2016   positive   BREAST EXCISIONAL BIOPSY Left    neg   BREAST LUMPECTOMY Right 2007   CATARACT EXTRACTION, BILATERAL Bilateral    COLONOSCOPY WITH PROPOFOL  N/A 06/14/2018   Procedure: COLONOSCOPY WITH PROPOFOL ;  Surgeon: Toledo, Ladell POUR, MD;  Location: [facility];  Service: Gastroenterology;  Laterality: N/A;   JOINT REPLACEMENT Left 2011   knee   JOINT REPLACEMENT Right 12/2011   knee   KNEE SURGERY Right 03/2012   MASTECTOMY Right 2016   MASTECTOMY MODIFIED RADICAL Right 05/22/2015   Procedure: MASTECTOMY MODIFIED  RADICAL;  Surgeon: Louanne KANDICE Dellie, MD;  Location: [facility] OR[facility];  Service: General;  Laterality: Right;    Family History  Problem Relation Age of Onset   Breast cancer Mother        dx late 23s   Heart attack Father    Heart attack Sister 16   Prostate cancer Brother        dx 11s   Dementia Paternal Grandfather     Social History:  reports that she quit smoking about 44 years ago. Her smoking use included cigarettes. She started smoking about 46 years ago. She has a 1.5 pack-year smoking history. She has been exposed to tobacco smoke. She has never used smokeless tobacco. She reports that she does not drink alcohol and does not use drugs.  Allergies: Allergies[1]  Medications reviewed.    ROS Full ROS performed and is otherwise negative other than what is stated in HPI   BP 125/66   Pulse (!) 58   Ht 5' 2 (1.575 m)   Wt 161 lb (73 kg)   SpO2 97%   BMI 29.45 kg/m   Physical Exam  Physical Exam Vitals and nursing note reviewed. Exam conducted with a chaperone present.  Constitutional:      Appearance: Normal appearance. She is normal weight.  Eyes:     General: No scleral icterus.       Right  eye: No discharge.        Left eye: No discharge.  Cardiovascular:     Rate and Rhythm: Normal rate.     Pulses: Normal pulses.     Heart sounds: No murmur.  Pulmonary:     Effort: Pulmonary effort is normal. No respiratory distress.     Breath sounds: Normal breath sounds. No stridor.     Comments: BREAST: Right mastectomy scar, no new chest wall lesions, No masses on the left, no LAD. Left breast w/o any palpable lesion, nml nipple, No axillary LAD Abdominal:     General: Abdomen is flat. There is no distension.     Palpations: There is no mass.     Tenderness: There is no abdominal tenderness.     Hernia: No hernia is present.  Musculoskeletal:     Cervical back: Normal range of motion and neck supple. No rigidity or tenderness.  Skin:    Capillary Refill:  Capillary refill takes less than 2 seconds.  Neurological:     General: No focal deficit present.     Mental Status: She is alert and oriented to person, place, and time.  Psychiatric:        Mood and Affect: Mood normal.        Thought Content: Thought content normal.       Judgment: Judgment normal.      Assessment/Plan: 2 very pleasant-year-old female with prior history of right breast cancer status post mastectomy.  No evidence of recurrent chest wall lesions and there is no evidence of any pathology on the left breast. I do not see evidence of complications related to remote mastectomy.  Findings were discussed with the patient in detail She wishes to continue yearly mammogram with physical exams.  No need for further interventions at this time I personally spent a total of 30 minutes in the care of the patient today including performing a medically appropriate exam/evaluation, counseling and educating, placing orders, referring and communicating with other health care professionals, documenting clinical information in the EHR, independently interpreting and reviewing images studies and coordinating care.   Laneta Luna, MD FACS General Surgeon     [1]  Allergies Allergen Reactions   Gabapentin  Other (See Comments)    dizziness   Penicillin G Rash

## 2025-04-19 ENCOUNTER — Ambulatory Visit: Admitting: Internal Medicine

## 2025-04-19 ENCOUNTER — Other Ambulatory Visit

## 2025-04-22 ENCOUNTER — Ambulatory Visit: Admitting: Nurse Practitioner

## 2025-04-22 ENCOUNTER — Other Ambulatory Visit
